# Patient Record
Sex: Female | Born: 1950 | Race: White | Hispanic: No | Marital: Married | Smoking: Never smoker
Health system: Southern US, Community
[De-identification: ages and names within clinical notes are randomized; demographics above are authoritative.]

## PROBLEM LIST (undated history)

## (undated) DIAGNOSIS — H409 Unspecified glaucoma: Secondary | ICD-10-CM

## (undated) DIAGNOSIS — I10 Essential (primary) hypertension: Secondary | ICD-10-CM

## (undated) DIAGNOSIS — E890 Postprocedural hypothyroidism: Secondary | ICD-10-CM

## (undated) DIAGNOSIS — M5416 Radiculopathy, lumbar region: Secondary | ICD-10-CM

## (undated) DIAGNOSIS — E079 Disorder of thyroid, unspecified: Secondary | ICD-10-CM

## (undated) DIAGNOSIS — F32A Depression, unspecified: Secondary | ICD-10-CM

## (undated) DIAGNOSIS — M1990 Unspecified osteoarthritis, unspecified site: Secondary | ICD-10-CM

## (undated) DIAGNOSIS — G4486 Cervicogenic headache: Secondary | ICD-10-CM

## (undated) DIAGNOSIS — F40240 Claustrophobia: Secondary | ICD-10-CM

## (undated) DIAGNOSIS — M4800 Spinal stenosis, site unspecified: Secondary | ICD-10-CM

## (undated) DIAGNOSIS — C73 Malignant neoplasm of thyroid gland: Secondary | ICD-10-CM

## (undated) DIAGNOSIS — F419 Anxiety disorder, unspecified: Secondary | ICD-10-CM

## (undated) HISTORY — DX: Radiculopathy, lumbar region: M54.16

## (undated) HISTORY — PX: FOOT SURGERY: SHX648

## (undated) HISTORY — DX: Unspecified osteoarthritis, unspecified site: M19.90

## (undated) HISTORY — DX: Claustrophobia: F40.240

## (undated) HISTORY — DX: Malignant neoplasm of thyroid gland: C73

## (undated) HISTORY — DX: Postprocedural hypothyroidism: E89.0

## (undated) HISTORY — DX: Unspecified glaucoma: H40.9

## (undated) HISTORY — DX: Essential (primary) hypertension: I10

## (undated) HISTORY — DX: Cervicogenic headache: G44.86

## (undated) HISTORY — DX: Depression, unspecified: F32.A

## (undated) HISTORY — DX: Anxiety disorder, unspecified: F41.9

## (undated) HISTORY — DX: Spinal stenosis, site unspecified: M48.00

---

## 2004-12-10 HISTORY — PX: THYROIDECTOMY: SHX17

## 2017-12-11 DIAGNOSIS — I10 Essential (primary) hypertension: Secondary | ICD-10-CM | POA: Insufficient documentation

## 2018-03-03 HISTORY — PX: LUMBAR LAMINECTOMY: SHX95

## 2019-10-08 LAB — HM COLONOSCOPY

## 2019-11-03 DIAGNOSIS — E890 Postprocedural hypothyroidism: Secondary | ICD-10-CM | POA: Insufficient documentation

## 2019-11-03 DIAGNOSIS — C73 Malignant neoplasm of thyroid gland: Secondary | ICD-10-CM | POA: Insufficient documentation

## 2019-12-14 LAB — HM DEXA SCAN

## 2020-06-21 DIAGNOSIS — M5416 Radiculopathy, lumbar region: Secondary | ICD-10-CM | POA: Insufficient documentation

## 2020-10-31 DIAGNOSIS — H4089 Other specified glaucoma: Secondary | ICD-10-CM | POA: Insufficient documentation

## 2020-10-31 DIAGNOSIS — Z9889 Other specified postprocedural states: Secondary | ICD-10-CM | POA: Insufficient documentation

## 2020-12-15 LAB — HM MAMMOGRAPHY

## 2021-04-03 HISTORY — PX: LAMINOTOMY: SHX998

## 2021-04-25 ENCOUNTER — Encounter (HOSPITAL_BASED_OUTPATIENT_CLINIC_OR_DEPARTMENT_OTHER): Payer: Self-pay | Admitting: Emergency Medicine

## 2021-04-25 ENCOUNTER — Emergency Department (HOSPITAL_BASED_OUTPATIENT_CLINIC_OR_DEPARTMENT_OTHER): Payer: Federal, State, Local not specified - PPO

## 2021-04-25 ENCOUNTER — Emergency Department (HOSPITAL_BASED_OUTPATIENT_CLINIC_OR_DEPARTMENT_OTHER)
Admission: EM | Admit: 2021-04-25 | Discharge: 2021-04-25 | Disposition: A | Discharge status: Home / Self Care | Payer: Federal, State, Local not specified - PPO | Attending: Emergency Medicine | Admitting: Emergency Medicine

## 2021-04-25 ENCOUNTER — Other Ambulatory Visit: Payer: Self-pay

## 2021-04-25 DIAGNOSIS — M545 Low back pain, unspecified: Secondary | ICD-10-CM | POA: Diagnosis present

## 2021-04-25 DIAGNOSIS — M5432 Sciatica, left side: Secondary | ICD-10-CM | POA: Insufficient documentation

## 2021-04-25 DIAGNOSIS — M5431 Sciatica, right side: Secondary | ICD-10-CM | POA: Insufficient documentation

## 2021-04-25 HISTORY — DX: Disorder of thyroid, unspecified: E07.9

## 2021-04-25 LAB — URINALYSIS, ROUTINE W REFLEX MICROSCOPIC
Bilirubin Urine: NEGATIVE
Glucose, UA: NEGATIVE mg/dL
Hgb urine dipstick: NEGATIVE
Ketones, ur: NEGATIVE mg/dL
Leukocytes,Ua: NEGATIVE
Nitrite: NEGATIVE
Protein, ur: NEGATIVE mg/dL
Specific Gravity, Urine: 1.015 (ref 1.005–1.030)
pH: 7 (ref 5.0–8.0)

## 2021-04-25 MED ORDER — KETOROLAC TROMETHAMINE 30 MG/ML IJ SOLN
30.0000 mg | Freq: Once | INTRAMUSCULAR | Status: AC
  Administered 2021-04-25: 30 mg via INTRAMUSCULAR
  Filled 2021-04-25: qty 1

## 2021-04-25 MED ORDER — DICLOFENAC SODIUM 1 % EX GEL: 2.0000 g | Freq: Four times a day (QID) | CUTANEOUS | 0 refills | Status: DC

## 2021-04-25 MED ORDER — METHOCARBAMOL 500 MG PO TABS: 500.0000 mg | ORAL_TABLET | Freq: Two times a day (BID) | ORAL | 0 refills | Status: DC

## 2021-04-25 MED ORDER — LIDOCAINE 5 % EX PTCH
2.0000 | MEDICATED_PATCH | CUTANEOUS | Status: DC
  Administered 2021-04-25: 2 via TRANSDERMAL
  Filled 2021-04-25: qty 2

## 2021-04-25 MED ORDER — LIDOCAINE 5 % EX PTCH: 1.0000 | MEDICATED_PATCH | CUTANEOUS | 0 refills | Status: DC

## 2021-04-25 NOTE — ED Triage Notes (Signed)
Pt arrives pov with driver, c/o bilateral leg pain, gluteal pain and weakness x 1 week. Pt endorses laminectomy 3 weeks pta in [place]. Pt denies shob, ambulatory to triage. Tramadol at 0900 today. Pt endoses lower abdominal pain, denies dysuria. Reports "my bladder hurts"

## 2021-04-25 NOTE — ED Provider Notes (Signed)
[facility] Provider Note   CSN: [id number] Arrival date & time: 04/25/21  1358     History Chief Complaint  Patient presents with  . Back Pain    Alyssa [name] is a 70 y.o. female.  70 year old female with complaint of pain from bilateral gluteal areas radiating down to her feet with sensation of tingling in both of her feet.  Lumbar lamenectomy L3-L4 at [facility] in [place] on 04/03/21. Dc home same day. Recovery was uneventful.  Patient recently relocated to this area, traveled via plane, no extensive period of inactivity or car rides otherwise.  Has been walking and reported tingling in the feet, tingling worse with sitting. Today, had same and went home to lay down and developed severe pain to both gluteal areas (equally painful), worse with sitting. Call to surgeon and was advised to come to the ER as she does not have care in this area. No falls or injuries, no injuries. Surgical site healing well. Bladder is "more notable" "hurts" not specifically dysuria, no lower abdominal pain.         Past Medical History:  Diagnosis Date  . Thyroid disease     There are no problems to display for this patient.   Past Surgical History:  Procedure Laterality Date  . BACK SURGERY    . FOOT SURGERY    . THYROIDECTOMY       OB History   No obstetric history on file.     History reviewed. No pertinent family history.  Social History   Tobacco Use  . Smoking status: Never Smoker  Substance Use Topics  . Alcohol use: Yes  . Drug use: Never    Home Medications Prior to Admission medications   Medication Sig Start Date End Date Taking? Authorizing Provider  diclofenac Sodium (VOLTAREN) 1 % GEL Apply 2 g topically 4 (four) times daily. 04/25/21  Yes Tacy Learn, PA-C  levothyroxine (SYNTHROID) 112 MCG tablet Take 112 mcg by mouth daily before breakfast.   Yes [provider]  lidocaine (LIDODERM) 5 % Place 1 patch onto the skin  daily. Remove & Discard patch within 12 hours or as directed by MD 04/25/21  Yes Tacy Learn, PA-C  methocarbamol (ROBAXIN) 500 MG tablet Take 1 tablet (500 mg total) by mouth 2 (two) times daily. 04/25/21  Yes Tacy Learn, PA-C    Allergies    Amoxicillin  Review of Systems   Review of Systems  Constitutional: Negative for fever.  Gastrointestinal: Negative for abdominal pain, nausea and vomiting.  Genitourinary: Negative for difficulty urinating, dysuria and pelvic pain.  Musculoskeletal: Positive for arthralgias and back pain. Negative for gait problem.  Skin: Negative for rash and wound.  Allergic/Immunologic: Negative for immunocompromised state.  Neurological: Negative for weakness and numbness.  All other systems reviewed and are negative.   Physical Exam Updated Vital Signs BP (!) 152/78 (BP Location: Right Arm)   Pulse 86   Temp 98.5 F (36.9 C) (Oral)   Resp 18   Ht 5\' 8"  (1.727 m)   Wt 72.6 kg   SpO2 100%   BMI 24.33 kg/m   Physical Exam Vitals and nursing note reviewed.  Constitutional:      General: She is not in acute distress.    Appearance: She is well-developed. She is not diaphoretic.  HENT:     Head: Normocephalic and atraumatic.  Cardiovascular:     Pulses: Normal pulses.  Pulmonary:  Effort: Pulmonary effort is normal.  Abdominal:     Palpations: Abdomen is soft.     Tenderness: There is no abdominal tenderness.  Musculoskeletal:        General: Tenderness present. No swelling.     Thoracic back: No tenderness or bony tenderness.     Lumbar back: Tenderness present. No bony tenderness. Positive right straight leg raise test and positive left straight leg raise test.       Back:     Right lower leg: No edema.     Left lower leg: No edema.     Comments: Tenderness of palpation left and right lumbar paraspinous extending to left and right SI areas.  Straight leg raise produces pain on each side with extension.  Skin:    General: Skin  is warm and dry.     Findings: No erythema or rash.  Neurological:     Mental Status: She is alert and oriented to person, place, and time.     Sensory: Sensation is intact.     Motor: No weakness.     Deep Tendon Reflexes: Babinski sign absent on the right side. Babinski sign absent on the left side.     Reflex Scores:      Patellar reflexes are 2+ on the right side and 2+ on the left side.      Achilles reflexes are 1+ on the right side and 1+ on the left side. Psychiatric:        Behavior: Behavior normal.     ED Results / Procedures / Treatments   Labs (all labs ordered are listed, but only abnormal results are displayed) Labs Reviewed  URINALYSIS, ROUTINE W REFLEX MICROSCOPIC    EKG None  Radiology DG Lumbar Spine Complete  Result Date: 04/25/2021 CLINICAL DATA:  Lower back pain. EXAM: LUMBAR SPINE - COMPLETE 4+ VIEW COMPARISON:  None. FINDINGS: A small, ill-defined chronic appearing deformity is seen along the anterior aspect of the inferior L1 vertebral body on the lateral view. Alignment is normal. Mild-to-moderate severity multilevel endplate sclerosis is seen, most prominent at the levels of L2-L3, L3-L4, L4-L5 and L5-S1. Intervertebral disc spaces are maintained. IMPRESSION: Mild to moderate severity multilevel degenerative changes, without an acute osseous abnormality. Electronically Signed   By: Virgina Norfolk M.D.   On: 04/25/2021 18:24    Procedures Procedures   Medications Ordered in ED Medications  lidocaine (LIDODERM) 5 % 2 patch (2 patches Transdermal Patch Applied 04/25/21 1805)  ketorolac (TORADOL) 30 MG/ML injection 30 mg (30 mg Intramuscular Given 04/25/21 1804)    ED Course  I have reviewed the triage vital signs and the nursing notes.  Pertinent labs & imaging results that were available during my care of the patient were reviewed by me and considered in my medical decision making (see chart for details).  Clinical Course as of 04/25/21 1839   Tue May 17, 278  [id number] 71 year old female with complaint of pain to left and right SI areas with report of pins and needle sensation down to her feet.  States that she has had problems with sciatica in the past and has been told that she may need some sort of procedure to assist with this.  She is status post L3-L4 lumbar laminectomy from April 03, 2021. Patient is recently relocated to this area and is awaiting follow-up with neurosurgery after she is called to request an appointment.  She does not have any new falls or injuries, no abdominal pain, no loss  of bowel or bladder control or groin numbness.  She is found of tenderness to her left right paraspinous lumbar area extending to SI joints bilaterally with pain on each side with straight leg raise.  Reflexes are symmetric as well as leg strength. Complaint of bladder pain without urinary symptoms otherwise, UA is unremarkable. X-ray of lumbar spine obtained due to recent surgical procedure although overlying scar is healing well. Patient is currently taking tramadol for her pain.  Plan is to treat with muscle relaxant, NSAID, Lidoderm patch and referral to sports medicine for further work-up while she awaits appointment with neurosurgery. [LM]    Clinical Course User Index [LM] Roque Lias   MDM Rules/Calculators/A&P                         Final Clinical Impression(s) / ED Diagnoses Final diagnoses:  Bilateral sciatica    Rx / DC Orders ED Discharge Orders         Ordered    lidocaine (LIDODERM) 5 %  Every 24 hours        04/25/21 1838    methocarbamol (ROBAXIN) 500 MG tablet  2 times daily        04/25/21 1838    diclofenac Sodium (VOLTAREN) 1 % GEL  4 times daily        04/25/21 1838           Tacy Learn, PA-C 04/25/21 1839    Blanchie Dessert, MD 05/03/21 [phone]

## 2021-04-25 NOTE — ED Notes (Addendum)
L3-L4 sx 3 weeks ago, pain to lower back, radiating down both legs to feet causing tingling.  Worsening in the last week. States pain worse with sitting. Took tylenol this am with no result

## 2021-04-25 NOTE — Discharge Instructions (Signed)
Warm compresses followed up gentle stretching. Lidoderm patch as needed as prescribed. Robaxin as needed as prescribed.  Topical Voltaren as needed as prescribed.  Follow up with sports medicine, call to schedule an appointment.

## 2021-04-26 DIAGNOSIS — G4486 Cervicogenic headache: Secondary | ICD-10-CM | POA: Insufficient documentation

## 2021-04-26 DIAGNOSIS — F40240 Claustrophobia: Secondary | ICD-10-CM | POA: Insufficient documentation

## 2021-04-26 DIAGNOSIS — F32A Anxiety disorder, unspecified: Secondary | ICD-10-CM | POA: Insufficient documentation

## 2021-04-26 DIAGNOSIS — F419 Anxiety disorder, unspecified: Secondary | ICD-10-CM | POA: Insufficient documentation

## 2021-05-11 ENCOUNTER — Ambulatory Visit: Payer: Federal, State, Local not specified - PPO | Admitting: Family Medicine

## 2021-05-15 ENCOUNTER — Other Ambulatory Visit: Payer: Self-pay

## 2021-05-15 ENCOUNTER — Ambulatory Visit: Payer: Federal, State, Local not specified - PPO | Admitting: Family Medicine

## 2021-05-15 ENCOUNTER — Ambulatory Visit: Payer: Self-pay

## 2021-05-15 ENCOUNTER — Encounter: Payer: Self-pay | Admitting: Family Medicine

## 2021-05-15 VITALS — BP 112/84 | Ht 68.0 in | Wt 160.0 lb

## 2021-05-15 DIAGNOSIS — M533 Sacrococcygeal disorders, not elsewhere classified: Secondary | ICD-10-CM | POA: Diagnosis not present

## 2021-05-15 DIAGNOSIS — Z9889 Other specified postprocedural states: Secondary | ICD-10-CM | POA: Diagnosis not present

## 2021-05-15 DIAGNOSIS — M48062 Spinal stenosis, lumbar region with neurogenic claudication: Secondary | ICD-10-CM

## 2021-05-15 DIAGNOSIS — M79675 Pain in left toe(s): Secondary | ICD-10-CM

## 2021-05-15 NOTE — Patient Instructions (Signed)
Nice to meet you Please try voltaren on the feet  Please try increasing the gabapentin  Please try the strengthening  When you find your orthotics, then I can add additional padding  Please send me a message in [place] with any questions or updates.  Please see me back in 4 weeks.   --Dr. Raeford Razor

## 2021-05-15 NOTE — Assessment & Plan Note (Signed)
Has had fusion of the first ray in February.  Still having some swelling on the area.  Seems a component of metatarsalgia in the left foot since the surgery. -Counseled on home exercise therapy and supportive care. -She will bring her custom orthotics and can place metatarsal padding and scaphoid pads.  May need to add a first ray post as well.

## 2021-05-15 NOTE — Assessment & Plan Note (Signed)
Has tenderness over the SI joints and also weakness with hip abduction.  Unclear if the SI joints or the source of her pain or the facet changes. -Counseled on home exercise therapy and supportive care. -Continue physical therapy. -Could consider injection.

## 2021-05-15 NOTE — Assessment & Plan Note (Signed)
Most recent back surgery was in April.  Does seem to have symptoms that could be contributed to the stenosis. -Counseled on home exercise therapy and supportive care. -Increase gabapentin to 3 times a day. -Could consider epidural.

## 2021-05-15 NOTE — Progress Notes (Signed)
Alyssa [name] - 70 y.o. female MRN [id number]  Date of birth: 1951/07/16  SUBJECTIVE:  Including CC & ROS.  No chief complaint on file.   Alyssa [name] is a 70 y.o. female that is presenting with acute on chronic low back pain with radicular pain.  She i is also experiencing pain in the left foot.  She has a history of back surgery on a few different occasions.  Today she is presenting with gluteal pain bilaterally and radicular pain down the legs.  Her most recent back surgery was in April.  She has a history of fusion of the left first ray in February.  Has had swelling and pain in the area since that time.  Has tried orthotics in the past.  Is currently on gabapentin and tramadol.  Review of the lumbar spine MRI from 2021 shows post left hemilaminectomy at L4-5 without recurrent spinal stenosis and no change in the mild to moderate spinal stenosis L3-4.  Having multiple level facet arthropathy   Review of Systems See HPI   HISTORY: Past Medical, Surgical, Social, and Family History Reviewed & Updated per EMR.   Pertinent Historical Findings include:  Past Medical History:  Diagnosis Date  . Anxiety and depression   . Arthritis   . Cervicogenic headache   . Claustrophobia   . Glaucoma   . Hypertension   . Lumbar radiculopathy   . Postsurgical hypothyroidism   . Spinal stenosis   . Thyroid cancer [facility])    s/p thyroidectomy    Past Surgical History:  Procedure Laterality Date  . FOOT SURGERY    . LAMINOTOMY  04/03/2021   LEFT L3/4 LAMINOTOMY BILATERAL L3/4 MEDIAL FACETECTOMY  . LUMBAR LAMINECTOMY  03/03/2018  . THYROIDECTOMY  12/2004    Family History  Problem Relation Age of Onset  . Hypertension Sister   . Diabetes Paternal Grandmother     Social History   Socioeconomic History  . Marital status: Married    Spouse name: Not on file  . Number of children: Not on file  . Years of education: Not on file  . Highest education level: Not on file  Occupational  History  . Not on file  Tobacco Use  . Smoking status: Never Smoker  . Smokeless tobacco: Never Used  Substance and Sexual Activity  . Alcohol use: Yes  . Drug use: Never  . Sexual activity: Not on file  Other Topics Concern  . Not on file  Social History Narrative  . Not on file   Social Determinants of Health   Financial Resource Strain: Not on file  Food Insecurity: Not on file  Transportation Needs: Not on file  Physical Activity: Not on file  Stress: Not on file  Social Connections: Not on file  Intimate Partner Violence: Not on file     PHYSICAL EXAM:  VS: BP 112/84 (BP Location: Left Arm, Patient Position: Sitting, Cuff Size: Large)   Ht 5\' 8"  (1.727 m)   Wt 160 lb (72.6 kg)   BMI 24.33 kg/m  Physical Exam Gen: NAD, alert, cooperative with exam, well-appearing MSK:  Left foot: Swelling and stiffness related to the first ray. Stiffness through the MTP joints. Back: Good motion with SI joint compression. Normal strength resistance. Tenderness to palpation over the SI joints. Neurovascular intact  Limited ultrasound: Left foot:  Metal artifact was appreciated over the first ray. No increased hyperemia in this area. Anechoic change at the fibular aspect of the first ray to  demonstrate effusion/swelling.  Summary: Ongoing healing from postsurgical changes.  Ultrasound and interpretation by Clearance Coots, MD    ASSESSMENT & PLAN:   SI (sacroiliac) joint dysfunction Has tenderness over the SI joints and also weakness with hip abduction.  Unclear if the SI joints or the source of her pain or the facet changes. -Counseled on home exercise therapy and supportive care. -Continue physical therapy. -Could consider injection.  Spinal stenosis, lumbar region, with neurogenic claudication Most recent back surgery was in April.  Does seem to have symptoms that could be contributed to the stenosis. -Counseled on home exercise therapy and supportive  care. -Increase gabapentin to 3 times a day. -Could consider epidural.  Status post left foot surgery Has had fusion of the first ray in February.  Still having some swelling on the area.  Seems a component of metatarsalgia in the left foot since the surgery. -Counseled on home exercise therapy and supportive care. -She will bring her custom orthotics and can place metatarsal padding and scaphoid pads.  May need to add a first ray post as well.

## 2021-06-05 ENCOUNTER — Encounter: Payer: Self-pay | Admitting: Internal Medicine

## 2021-06-05 ENCOUNTER — Other Ambulatory Visit: Payer: Self-pay

## 2021-06-05 ENCOUNTER — Ambulatory Visit: Payer: Federal, State, Local not specified - PPO | Admitting: Internal Medicine

## 2021-06-05 ENCOUNTER — Telehealth: Payer: Self-pay | Admitting: Internal Medicine

## 2021-06-05 VITALS — BP 122/60 | HR 62 | Temp 98.2°F | Resp 18 | Ht 68.0 in | Wt 166.2 lb

## 2021-06-05 DIAGNOSIS — E890 Postprocedural hypothyroidism: Secondary | ICD-10-CM

## 2021-06-05 DIAGNOSIS — I10 Essential (primary) hypertension: Secondary | ICD-10-CM

## 2021-06-05 DIAGNOSIS — H409 Unspecified glaucoma: Secondary | ICD-10-CM | POA: Diagnosis not present

## 2021-06-05 DIAGNOSIS — Z01419 Encounter for gynecological examination (general) (routine) without abnormal findings: Secondary | ICD-10-CM

## 2021-06-05 DIAGNOSIS — Z85850 Personal history of malignant neoplasm of thyroid: Secondary | ICD-10-CM | POA: Diagnosis not present

## 2021-06-05 LAB — COMPREHENSIVE METABOLIC PANEL
ALT: 18 U/L (ref 0–35)
AST: 20 U/L (ref 0–37)
Albumin: 4.2 g/dL (ref 3.5–5.2)
Alkaline Phosphatase: 80 U/L (ref 39–117)
BUN: 14 mg/dL (ref 6–23)
CO2: 27 mEq/L (ref 19–32)
Calcium: 9.4 mg/dL (ref 8.4–10.5)
Chloride: 102 mEq/L (ref 96–112)
Creatinine, Ser: 0.76 mg/dL (ref 0.40–1.20)
GFR: 80.75 mL/min (ref >60.00)
Glucose, Bld: 66 mg/dL — ABNORMAL LOW (ref 70–99)
Potassium: 3.9 mEq/L (ref 3.5–5.1)
Sodium: 138 mEq/L (ref 135–145)
Total Bilirubin: 0.6 mg/dL (ref 0.2–1.2)
Total Protein: 6.5 g/dL (ref 6.0–8.3)

## 2021-06-05 LAB — TSH: TSH: 0.08 u[IU]/mL — ABNORMAL LOW (ref 0.35–4.50)

## 2021-06-05 NOTE — Progress Notes (Signed)
Subjective:    Patient ID: Alyssa [name], female    DOB: Jan 28, 1951, 70 y.o.   MRN: [id number]  DOS:  06/05/2021 Type of visit - description: New patient   Recently moved to this area, referred to the office by her sister.  Currently having back pain with radiation to the gluteal area bilaterally, some paresthesias, worse on the left leg.  She denies fever chills No chest pain no difficulty breathing Emotionally doing well.    Review of Systems See above   Past Medical History:  Diagnosis Date   Anxiety and depression    Arthritis    Cervicogenic headache    Claustrophobia    Glaucoma    Hypertension    Lumbar radiculopathy    Postsurgical hypothyroidism    Spinal stenosis    Thyroid cancer [facility])    s/p thyroidectomy    Past Surgical History:  Procedure Laterality Date   FOOT SURGERY     LAMINOTOMY  04/03/2021   LEFT L3/4 LAMINOTOMY BILATERAL L3/4 MEDIAL FACETECTOMY   LUMBAR LAMINECTOMY  03/03/2018   THYROIDECTOMY  12/2004   Social History   Socioeconomic History   Marital status: Married    Spouse name: Not on file   Number of children: 0   Years of education: Not on file   Highest education level: Not on file  Occupational History   Occupation: retired, Pharmacologist  Tobacco Use   Smoking status: Never   Smokeless tobacco: Never  Substance and Sexual Activity   Alcohol use: Yes   Drug use: Never   Sexual activity: Not on file  Other Topics Concern   Not on file  Social History Narrative   Moved from [place] June 2022   Lives w/ husband   Moved to stay close to her sister (Mrs Terance Hart)   Social Determinants of Health   Financial Resource Strain: Not on file  Food Insecurity: Not on file  Transportation Needs: Not on file  Physical Activity: Not on file  Stress: Not on file  Social Connections: Not on file  Intimate Partner Violence: Not on file   Family History  Problem Relation Age of Onset   Hypertension Sister    Diabetes Paternal  Grandmother    Colon cancer Neg Hx    Breast cancer Neg Hx      Allergies as of 06/05/2021       Reactions   Amoxicillin Hives        Medication List        Accurate as of June 05, 2021 11:59 PM. If you have any questions, ask your nurse or doctor.          STOP taking these medications    lidocaine 5 % Commonly known as: Lidoderm Stopped by: Kathlene November, MD   methocarbamol 500 MG tablet Commonly known as: ROBAXIN Stopped by: Kathlene November, MD       TAKE these medications    acyclovir 400 MG tablet Commonly known as: ZOVIRAX acyclovir 400 mg tablet  PRN   diclofenac Sodium 1 % Gel Commonly known as: VOLTAREN Apply 2 g topically 4 (four) times daily.   escitalopram 10 MG tablet Commonly known as: LEXAPRO   gabapentin 300 MG capsule Commonly known as: NEURONTIN Take 300 mg by mouth 2 (two) times daily. What changed: Another medication with the same name was removed. Continue taking this medication, and follow the directions you see here. Changed by: Kathlene November, MD   levothyroxine 112 MCG tablet  Commonly known as: SYNTHROID Take 112 mcg by mouth daily before breakfast.   meloxicam 7.5 MG tablet Commonly known as: MOBIC Take 7.5 mg by mouth daily.   timolol 0.5 % ophthalmic solution Commonly known as: TIMOPTIC timolol maleate 0.5 % eye drops  once daily   traMADol 50 MG tablet Commonly known as: ULTRAM Take by mouth.   TRAVOPROST OP Apply to eye.   zolpidem 12.5 MG CR tablet Commonly known as: AMBIEN CR Take by mouth as needed.           Objective:   Physical Exam BP 122/60 (BP Location: Left Arm, Patient Position: Sitting, Cuff Size: Normal)   Pulse 62   Temp 98.2 F (36.8 C) (Oral)   Resp 18   Ht 5\' 8"  (1.727 m)   Wt 166 lb 3.2 oz (75.4 kg)   SpO2 98%   BMI 25.27 kg/m  General:   Well developed, NAD, BMI noted.  HEENT:  Normocephalic . Face symmetric, atraumatic Neck: Absent thyroid, no lymphadenopathies Lungs:  CTA B Normal  respiratory effort, no intercostal retractions, no accessory muscle use. Heart: RRR,  no murmur.  Abdomen:  Not distended, soft, non-tender. No rebound or rigidity.   Skin: Not pale. Not jaundice Lower extremities: no pretibial edema bilaterally  Neurologic:  alert & oriented X3.  Speech normal, gait appropriate for age and unassisted Psych--  Cognition and judgment appear intact.  Cooperative with normal attention span and concentration.  Behavior appropriate. No anxious or depressed appearing.     Assessment    Assessment H/o  HTN anxiety related, no meds  Anxiety Thyroid cancer Dx 2006, total thyroidectomy 2006. Glaucoma MSK: Back surgery 03/2021, DJD, foot surgery, cervical stenosis  Plan: HTN: History of, currently on no medicines, check a CMP and CBC Hypothyroidism, status post total thyroidectomy 2006, request Endo referral.  We will arrange, check TSH Anxiety: On Lexapro, seems well controlled. MSK:  Had 2 lumbar laminectomies, last was seen in April 2022 in [place], locally here already established and  sees Dr. Vertell Limber. Also reports bilateral foot pain, has seen Ortho. Glaucoma: Needs a referral to ophthalmology. Preventive care: Had 2 or 3 colonoscopies, the last was 2021, + polyps, next 5 years I note that she had recent her mammogram Had COVID-vaccine x3, okay to proceed with another vaccine at her convenience. Needs a referral to gynecology. DEXA 12/14/2019: T score -1.2, osteopenia RTC CPX 4 months   This visit occurred during the SARS-CoV-2 public health emergency.  Safety protocols were in place, including screening questions prior to the visit, additional usage of staff PPE, and extensive cleaning of exam room while observing appropriate contact time as indicated for disinfecting solutions.

## 2021-06-05 NOTE — Telephone Encounter (Signed)
Please advise 

## 2021-06-05 NOTE — Telephone Encounter (Signed)
Advise patient to call 10 days prior to physical to schedule labs.

## 2021-06-05 NOTE — Patient Instructions (Addendum)
   GO TO THE LAB : Get the blood work     GO TO THE FRONT DESK, [place] Come back for a physical exam in 3-4 months

## 2021-06-05 NOTE — Telephone Encounter (Signed)
[facility] booked for morning appts until dec  Patient needed one in oct  Can we put labs in for fasting?

## 2021-06-05 NOTE — Telephone Encounter (Signed)
Please inform Pt- thank you.

## 2021-06-06 DIAGNOSIS — Z09 Encounter for follow-up examination after completed treatment for conditions other than malignant neoplasm: Secondary | ICD-10-CM | POA: Insufficient documentation

## 2021-06-06 NOTE — Assessment & Plan Note (Signed)
HTN: History of, currently on no medicines, check a CMP and CBC Hypothyroidism, status post total thyroidectomy 2006, request Endo referral.  We will arrange, check TSH Anxiety: On Lexapro, seems well controlled. MSK:  Had 2 lumbar laminectomies, last was seen in April 2022 in [place], locally here already established and  sees Dr. Vertell Limber. Also reports bilateral foot pain, has seen Ortho. Glaucoma: Needs a referral to ophthalmology. Preventive care: Had 2 or 3 colonoscopies, the last was 2021, + polyps, next 5 years I note that she had recent her mammogram Had COVID-vaccine x3, okay to proceed with another vaccine at her convenience. Needs a referral to gynecology. DEXA 12/14/2019: T score -1.2, osteopenia RTC CPX 4 months

## 2021-06-07 NOTE — Addendum Note (Signed)
Addended byDamita Dunnings D on: 06/07/2021 03:49 PM   Modules accepted: Orders

## 2021-06-08 ENCOUNTER — Other Ambulatory Visit: Payer: Self-pay

## 2021-06-08 ENCOUNTER — Other Ambulatory Visit (INDEPENDENT_AMBULATORY_CARE_PROVIDER_SITE_OTHER): Payer: Federal, State, Local not specified - PPO

## 2021-06-08 DIAGNOSIS — I10 Essential (primary) hypertension: Secondary | ICD-10-CM | POA: Diagnosis not present

## 2021-06-08 LAB — CBC WITH DIFFERENTIAL/PLATELET
Basophils Absolute: 0.1 10*3/uL (ref 0.0–0.1)
Basophils Relative: 2.9 % (ref 0.0–3.0)
Eosinophils Absolute: 0.1 10*3/uL (ref 0.0–0.7)
Eosinophils Relative: 1.8 % (ref 0.0–5.0)
HCT: 38.4 % (ref 36.0–46.0)
Hemoglobin: 13.2 g/dL (ref 12.0–15.0)
Lymphocytes Relative: 34.7 % (ref 12.0–46.0)
Lymphs Abs: 1.7 10*3/uL (ref 0.7–4.0)
MCHC: 34.3 g/dL (ref 30.0–36.0)
MCV: 86.8 fl (ref 78.0–100.0)
Monocytes Absolute: 0.4 10*3/uL (ref 0.1–1.0)
Monocytes Relative: 8.4 % (ref 3.0–12.0)
Neutro Abs: 2.6 10*3/uL (ref 1.4–7.7)
Neutrophils Relative %: 52.2 % (ref 43.0–77.0)
Platelets: 253 10*3/uL (ref 150.0–400.0)
RBC: 4.42 Mil/uL (ref 3.87–5.11)
RDW: 12.9 % (ref 11.5–15.5)
WBC: 5 10*3/uL (ref 4.0–10.5)

## 2021-06-14 ENCOUNTER — Ambulatory Visit: Payer: Federal, State, Local not specified - PPO | Admitting: Family Medicine

## 2021-06-16 ENCOUNTER — Observation Stay (HOSPITAL_BASED_OUTPATIENT_CLINIC_OR_DEPARTMENT_OTHER)
Admission: EM | Admit: 2021-06-16 | Discharge: 2021-06-17 | Disposition: A | Discharge status: Home / Self Care | Payer: Medicare Other | Attending: Family Medicine | Admitting: Family Medicine

## 2021-06-16 ENCOUNTER — Other Ambulatory Visit: Payer: Self-pay

## 2021-06-16 ENCOUNTER — Encounter (HOSPITAL_BASED_OUTPATIENT_CLINIC_OR_DEPARTMENT_OTHER): Payer: Self-pay | Admitting: *Deleted

## 2021-06-16 ENCOUNTER — Ambulatory Visit (INDEPENDENT_AMBULATORY_CARE_PROVIDER_SITE_OTHER): Payer: Medicare Other | Admitting: Medical

## 2021-06-16 ENCOUNTER — Emergency Department (HOSPITAL_BASED_OUTPATIENT_CLINIC_OR_DEPARTMENT_OTHER): Payer: Medicare Other

## 2021-06-16 ENCOUNTER — Telehealth: Payer: Self-pay | Admitting: *Deleted

## 2021-06-16 ENCOUNTER — Encounter: Payer: Self-pay | Admitting: Medical

## 2021-06-16 VITALS — BP 189/90 | HR 64 | Temp 98.3°F | Resp 20 | Ht 68.0 in | Wt 169.6 lb

## 2021-06-16 DIAGNOSIS — K2900 Acute gastritis without bleeding: Secondary | ICD-10-CM | POA: Diagnosis not present

## 2021-06-16 DIAGNOSIS — K5900 Constipation, unspecified: Secondary | ICD-10-CM | POA: Diagnosis not present

## 2021-06-16 DIAGNOSIS — R112 Nausea with vomiting, unspecified: Secondary | ICD-10-CM | POA: Diagnosis present

## 2021-06-16 DIAGNOSIS — R61 Generalized hyperhidrosis: Secondary | ICD-10-CM | POA: Diagnosis not present

## 2021-06-16 DIAGNOSIS — Z20822 Contact with and (suspected) exposure to covid-19: Secondary | ICD-10-CM | POA: Insufficient documentation

## 2021-06-16 DIAGNOSIS — E039 Hypothyroidism, unspecified: Secondary | ICD-10-CM | POA: Insufficient documentation

## 2021-06-16 DIAGNOSIS — K5909 Other constipation: Secondary | ICD-10-CM

## 2021-06-16 DIAGNOSIS — Z79899 Other long term (current) drug therapy: Secondary | ICD-10-CM | POA: Insufficient documentation

## 2021-06-16 DIAGNOSIS — E871 Hypo-osmolality and hyponatremia: Principal | ICD-10-CM | POA: Diagnosis present

## 2021-06-16 DIAGNOSIS — R8271 Bacteriuria: Secondary | ICD-10-CM | POA: Insufficient documentation

## 2021-06-16 DIAGNOSIS — Z85850 Personal history of malignant neoplasm of thyroid: Secondary | ICD-10-CM | POA: Insufficient documentation

## 2021-06-16 DIAGNOSIS — R55 Syncope and collapse: Secondary | ICD-10-CM | POA: Diagnosis not present

## 2021-06-16 DIAGNOSIS — I10 Essential (primary) hypertension: Secondary | ICD-10-CM | POA: Diagnosis not present

## 2021-06-16 DIAGNOSIS — K2970 Gastritis, unspecified, without bleeding: Secondary | ICD-10-CM

## 2021-06-16 LAB — URINALYSIS, ROUTINE W REFLEX MICROSCOPIC
Bilirubin Urine: NEGATIVE
Glucose, UA: NEGATIVE mg/dL
Hgb urine dipstick: NEGATIVE
Ketones, ur: 15 mg/dL — AB
Nitrite: NEGATIVE
Protein, ur: NEGATIVE mg/dL
Specific Gravity, Urine: 1.01 (ref 1.005–1.030)
pH: 7 (ref 5.0–8.0)

## 2021-06-16 LAB — CBC WITH DIFFERENTIAL/PLATELET
Abs Immature Granulocytes: 0.02 10*3/uL (ref 0.00–0.07)
Basophils Absolute: 0.2 10*3/uL — ABNORMAL HIGH (ref 0.0–0.1)
Basophils Relative: 2 %
Eosinophils Absolute: 0 10*3/uL (ref 0.0–0.5)
Eosinophils Relative: 1 %
HCT: 41.4 % (ref 36.0–46.0)
Hemoglobin: 14.6 g/dL (ref 12.0–15.0)
Immature Granulocytes: 0 %
Lymphocytes Relative: 23 %
Lymphs Abs: 1.5 10*3/uL (ref 0.7–4.0)
MCH: 29.6 pg (ref 26.0–34.0)
MCHC: 35.3 g/dL (ref 30.0–36.0)
MCV: 83.8 fL (ref 80.0–100.0)
Monocytes Absolute: 0.3 10*3/uL (ref 0.1–1.0)
Monocytes Relative: 5 %
Neutro Abs: 4.6 10*3/uL (ref 1.7–7.7)
Neutrophils Relative %: 69 %
Platelets: 326 10*3/uL (ref 150–400)
RBC: 4.94 MIL/uL (ref 3.87–5.11)
RDW: 12.1 % (ref 11.5–15.5)
WBC: 6.7 10*3/uL (ref 4.0–10.5)
nRBC: 0 % (ref 0.0–0.2)

## 2021-06-16 LAB — URINALYSIS, MICROSCOPIC (REFLEX)

## 2021-06-16 LAB — COMPREHENSIVE METABOLIC PANEL
ALT: 26 U/L (ref 0–44)
AST: 30 U/L (ref 15–41)
Albumin: 4.4 g/dL (ref 3.5–5.0)
Alkaline Phosphatase: 86 U/L (ref 38–126)
Anion gap: 8 (ref 5–15)
BUN: 10 mg/dL (ref 8–23)
CO2: 23 mmol/L (ref 22–32)
Calcium: 9.2 mg/dL (ref 8.9–10.3)
Chloride: 91 mmol/L — ABNORMAL LOW (ref 98–111)
Creatinine, Ser: 0.72 mg/dL (ref 0.44–1.00)
GFR, Estimated: >60 mL/min (ref >60)
Glucose, Bld: 125 mg/dL — ABNORMAL HIGH (ref 70–99)
Potassium: 3.9 mmol/L (ref 3.5–5.1)
Sodium: 122 mmol/L — ABNORMAL LOW (ref 135–145)
Total Bilirubin: 0.8 mg/dL (ref 0.3–1.2)
Total Protein: 7.4 g/dL (ref 6.5–8.1)

## 2021-06-16 LAB — LIPASE, BLOOD: Lipase: 31 U/L (ref 11–51)

## 2021-06-16 LAB — RESP PANEL BY RT-PCR (FLU A&B, COVID) ARPGX2
Influenza A by PCR: NEGATIVE
Influenza B by PCR: NEGATIVE
SARS Coronavirus 2 by RT PCR: NEGATIVE

## 2021-06-16 MED ORDER — SODIUM CHLORIDE 0.9 % IV BOLUS
1000.0000 mL | Freq: Once | INTRAVENOUS | Status: AC
  Administered 2021-06-16: 1000 mL via INTRAVENOUS

## 2021-06-16 MED ORDER — PANTOPRAZOLE SODIUM 40 MG IV SOLR
40.0000 mg | Freq: Once | INTRAVENOUS | Status: AC
  Administered 2021-06-16: 40 mg via INTRAVENOUS
  Filled 2021-06-16: qty 40

## 2021-06-16 MED ORDER — ONDANSETRON HCL 4 MG/2ML IJ SOLN
4.0000 mg | Freq: Once | INTRAMUSCULAR | Status: AC
  Administered 2021-06-16: 4 mg via INTRAVENOUS
  Filled 2021-06-16: qty 2

## 2021-06-16 MED ORDER — IOHEXOL 300 MG/ML  SOLN
100.0000 mL | Freq: Once | INTRAMUSCULAR | Status: AC | PRN
  Administered 2021-06-16: 100 mL via INTRAVENOUS

## 2021-06-16 NOTE — Telephone Encounter (Signed)
Patient has appointment today.   Call Type Triage / Clinical Relationship To Patient Self Return Phone Number [phone] (Primary) Chief Complaint FAINTING or [place] Reason for Call Symptomatic / Request for Health Information Initial Comment PT has been having nausea, sweating, fainting feeling, nerve pain and feet is hurting and sensitive. Translation No Nurse Assessment Nurse: Kathi Ludwig, RN, Leana Roe Date/Time (Eastern Time): 06/16/2021 12:53:09 PM Confirm and document reason for call. If symptomatic, describe symptoms. ---Caller states diaphoretic, nausea, feeling faint. no bp meds, no diabetes

## 2021-06-16 NOTE — ED Notes (Addendum)
Pt reports she had back surgery in April and foot surgery in Feb. Reports she has been having pains since. States today she was vomiting and felt like she may faint. Pt reports she has been constipated for the last week until arriving to ED where she had a large BM.

## 2021-06-16 NOTE — Progress Notes (Signed)
Patient arrived to the unit via stretcher from [place] at approx 2241. Patient alert and oriented x4 no c/o pain at this time. On call notified of patient arrival. Telemetry placed upon arrival Will continue to monitor.

## 2021-06-16 NOTE — ED Triage Notes (Signed)
Vomiting since 11am. She was seen by her MD and given Phenergan. Vomiting continues.

## 2021-06-16 NOTE — Progress Notes (Signed)
Subjective:    Patient ID: Alyssa [name], female    DOB: [birth year], 70 y.o.   MRN: [id number]  HPI  Pt has been feeling very fatigued for one week. Hx of some dehydration in past per pt and husband.  When I  first came in room she started vomiting about 15-20 times Earlier today she was sweating randomly and felt like she was going to pass out.  Pt calf muscle feel tight over few weeks.some baseline calf tight feeling.  Pt states about one month ago she had headaches, teeth pain, feeling of sand in her feet along with nausea.(Describes even a month ago not well)  Pt has hx of htn sx in chart but June reading very good Her blood pressure was very high today. Stayed high on recheck. Mild ha but came on after vomiting. No gross motor or sensory deficts reports.  One week since signiticant stool only small hard stools. Hx of constipation. Today feels bloated. No hx of obstruction or bowel surgeries.  Nausea and vomiting today. Pt did eat breaking smoothie, piece of toast, fruit and coffee. No lunch.         Review of Systems  Constitutional:  Positive for diaphoresis and fatigue.  Respiratory:  Negative for cough, chest tightness, shortness of breath and wheezing.   Cardiovascular:  Negative for chest pain and palpitations.  Gastrointestinal:  Negative for abdominal pain.  Genitourinary:  Negative for difficulty urinating, dyspareunia, dysuria, frequency, pelvic pain and urgency.  Musculoskeletal:  Negative for back pain and neck pain.  Skin:  Negative for rash.  Neurological:  Negative for dizziness and headaches.       Near syncope early in day.  Hematological:  Negative for adenopathy. Does not bruise/bleed easily.  Psychiatric/Behavioral:  Negative for behavioral problems, decreased concentration and sleep disturbance. The patient is not nervous/anxious.     Past Medical History:  Diagnosis Date   Anxiety and depression    Arthritis    Cervicogenic headache     Claustrophobia    Glaucoma    Hypertension    Lumbar radiculopathy    Postsurgical hypothyroidism    Spinal stenosis    Thyroid cancer (HCC)    s/p thyroidectomy     Social History   Socioeconomic History   Marital status: Married    Spouse name: Not on file   Number of children: 0   Years of education: Not on file   Highest education level: Not on file  Occupational History   Occupation: retired, Pharmacologist  Tobacco Use   Smoking status: Never   Smokeless tobacco: Never  Substance and Sexual Activity   Alcohol use: Yes   Drug use: Never   Sexual activity: Not on file  Other Topics Concern   Not on file  Social History Narrative   Moved from [place] June 2022   Lives w/ husband   Moved to stay close to her sister (Mrs Terance Hart)   Social Determinants of Health   Financial Resource Strain: Not on file  Food Insecurity: Not on file  Transportation Needs: Not on file  Physical Activity: Not on file  Stress: Not on file  Social Connections: Not on file  Intimate Partner Violence: Not on file    Past Surgical History:  Procedure Laterality Date   FOOT SURGERY     LAMINOTOMY  04/03/2021   LEFT L3/4 LAMINOTOMY BILATERAL L3/4 MEDIAL FACETECTOMY   LUMBAR LAMINECTOMY  03/03/2018   THYROIDECTOMY  12/2004  Family History  Problem Relation Age of Onset   Hypertension Sister    Diabetes Paternal Grandmother    Colon cancer Neg Hx    Breast cancer Neg Hx     Allergies  Allergen Reactions   Amoxicillin Hives    Current Outpatient Medications on File Prior to Visit  Medication Sig Dispense Refill   acyclovir (ZOVIRAX) 400 MG tablet acyclovir 400 mg tablet  PRN     diclofenac Sodium (VOLTAREN) 1 % GEL Apply 2 g topically 4 (four) times daily. 100 g 0   escitalopram (LEXAPRO) 10 MG tablet      gabapentin (NEURONTIN) 300 MG capsule Take 300 mg by mouth 2 (two) times daily.     levothyroxine (SYNTHROID) 112 MCG tablet Take 1 tablet by mouth 6 days weekly      meloxicam (MOBIC) 7.5 MG tablet Take 7.5 mg by mouth daily.     timolol (TIMOPTIC) 0.5 % ophthalmic solution timolol maleate 0.5 % eye drops  once daily     traMADol (ULTRAM) 50 MG tablet Take by mouth.     TRAVOPROST OP Apply to eye.     zolpidem (AMBIEN CR) 12.5 MG CR tablet Take by mouth as needed.     No current facility-administered medications on file prior to visit.    BP (!) 189/90   Pulse 64   Temp 98.3 F (36.8 C)   Resp 20   Ht 5\' 8"  (1.727 m)   Wt 169 lb 9.6 oz (76.9 kg)   SpO2 100%   BMI 25.79 kg/m       Objective:   Physical Exam  General Mental Status- Alert. General Appearance- Not in acute distress.   Skin General: Color- Normal Color. Moisture- Normal Moisture.  Neck Carotid Arteries- Normal color. Moisture- Normal Moisture. No carotid bruits. No JVD.  Chest and Lung Exam Auscultation: Breath Sounds:-Normal.  Cardiovascular Auscultation:Rythm- Regular. Murmurs & Other Heart Sounds:Auscultation of the heart reveals- No Murmurs.  Abdomen Inspection:-Inspeection Normal. Palpation/Percussion:Note:No mass. Palpation and Percussion of the abdomen reveal- mild rt upper quadrant Tender, Non Distended + BS, no rebound or guarding.    Neurologic Cranial Nerve exam:- CN III-XII intact(No nystagmus), symmetric smile. Drift Test:- No drift. Finger to Nose:- Normal/Intact Strength:- 5/5 equal and symmetric strength both upper and lower extremities.       Assessment & Plan:  Pt with severe fatigue, near syncope earlier today, sweating,  severe high bp, constipation, heavy vomiting, mild abdomen pain/bloating and tight calf muscles(maybe cramping).   Considering need to rule out bowel obstruction, pancreatitis, evaluate electrolyte abnormality and bring bp down to reasonable level.   Overall appears not stable as we approach weekend. Think best to be evaluated in the ED for work up. I think would benefit cmp, cbc, abdomen imaging, lipae, possible iv  hydration and bp medication if bp not coming down to reasonable level.  Asked staff to call ED and update on presentation.  Follow up with pcp/our office after ED evaluation.  Time spent with patient today was 41  minutes which consisted of chart revdiew, discussing diagnosis, work up treatment and documentation.

## 2021-06-16 NOTE — ED Notes (Signed)
ED Provider at bedside. 

## 2021-06-16 NOTE — Patient Instructions (Addendum)
Pt with severe fatigue, near syncope, sweating,  severe high bp, constipation, heavy vomiting, mild abdomen pain/bloating and tight calf muscles(maybe cramping).   Considering need to rule out bowel obstruction,  dehydration, pancreatitis and bring bp down to reasonable level.   Overall appears not stable as we approach weekend. Think best to be evaluated in the ED for work up. I think would benefit cmp, cbc, abdomen imaging, lipae, possible iv hydration and bp medication if bp not coming down to reasonable level.  Asked staff to call ED and update on presentation.  Follow up with pcp/our office after ED evaluation.

## 2021-06-16 NOTE — ED Provider Notes (Signed)
[name] Provider Note   CSN: [id number] Arrival date & time: 06/16/21  1508     History Chief Complaint  Patient presents with   Emesis    Alyssa [name] is a 70 y.o. female with past medical history of hypertension not on antihypertensives, anxiety and depression that presents to the emerge department today for nausea vomiting and constipation.  Patient states that this morning she started having some hot flashes and felt like she was going to pass out and then started vomiting.  Patient states that she got super sweaty.  Did not actually pass out.  States that she has been having generalized abdominal pain.  Patient states that she has been constipated for the past week, however while in the ER she had a bowel movement.  Nonbloody.  Patient states that she went to go see her PCP today, had an episode of vomiting while in the office and they sent her down to the ED.  Patient's triage note states that she was given Phenergan, however patient denies that this ever happened.  Patient states that she is had 3 aggressive bouts of vomiting today.  Nonbloody.  Patient states abdominal pain is generalized.  Denies any chest pain or shortness of breath.  Denies any regurgitation, cough or URI symptoms.  Denies any urinary or vaginal symptoms.  Patient states that she is never been put on anything for her hypertension.  Patient states that the pain is not severe right now, does not want anything for the pain right now.  HPI     Past Medical History:  Diagnosis Date   Anxiety and depression    Arthritis    Cervicogenic headache    Claustrophobia    Glaucoma    Hypertension    Lumbar radiculopathy    Postsurgical hypothyroidism    Spinal stenosis    Thyroid cancer [facility])    s/p thyroidectomy    Patient Active Problem List   Diagnosis Date Noted   Hyponatremia 06/16/2021   PCP NOTES >>>>>>>>>>> 06/06/2021   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    Spinal stenosis, lumbar region, 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   Status post left foot surgery 05/15/2021   Anxiety and depression 04/26/2021   Cervicogenic headache 04/26/2021   Claustrophobia 04/26/2021   History of lumbar laminectomy 10/31/2020   Other specified glaucoma 10/31/2020   Left lumbar radiculopathy 06/21/2020   Malignant neoplasm of thyroid gland ([place]) 11/03/2019   Postoperative hypothyroidism 11/03/2019   Essential hypertension 12/11/2017    Past Surgical History:  Procedure Laterality Date   FOOT SURGERY     LAMINOTOMY  04/03/2021   LEFT L3/4 LAMINOTOMY BILATERAL L3/4 MEDIAL FACETECTOMY   LUMBAR LAMINECTOMY  03/03/2018   THYROIDECTOMY  12/2004     OB History   No obstetric history on file.     Family History  Problem Relation Age of Onset   Hypertension Sister    Diabetes Paternal Grandmother    Colon cancer Neg Hx    Breast cancer Neg Hx     Social History   Tobacco Use   Smoking status: Never   Smokeless tobacco: Never  Substance Use Topics   Alcohol use: Yes   Drug use: Never    Home Medications Prior to Admission medications   Medication Sig Start Date End Date Taking? Authorizing Provider  acyclovir (ZOVIRAX) 400 MG tablet acyclovir 400 mg tablet  PRN    [provider]  diclofenac Sodium (VOLTAREN) 1 % GEL  Apply 2 g topically 4 (four) times daily. 04/25/21   Tacy Learn, PA-C  escitalopram (LEXAPRO) 10 MG tablet  03/27/21   [provider]  gabapentin (NEURONTIN) 300 MG capsule Take 300 mg by mouth 2 (two) times daily. 03/27/21   [provider]  levothyroxine (SYNTHROID) 112 MCG tablet Take 1 tablet by mouth 6 days weekly 06/07/21   Colon Branch, MD  meloxicam (MOBIC) 7.5 MG tablet Take 7.5 mg by mouth daily. 05/30/21   [provider]  timolol (TIMOPTIC) 0.5 % ophthalmic solution timolol maleate 0.5 % eye drops  once daily 08/15/17   [provider]  traMADol (ULTRAM) 50 MG tablet Take  by mouth. 12/13/20   [provider]  TRAVOPROST OP Apply to eye.    [provider]  zolpidem (AMBIEN CR) 12.5 MG CR tablet Take by mouth as needed.    [provider]    Allergies    Amoxicillin  Review of Systems   Review of Systems  Constitutional:  Negative for chills, diaphoresis, fatigue and fever.  HENT:  Negative for congestion, sore throat and trouble swallowing.   Eyes:  Negative for pain and visual disturbance.  Respiratory:  Negative for cough, shortness of breath and wheezing.   Cardiovascular:  Negative for chest pain, palpitations and leg swelling.  Gastrointestinal:  Positive for abdominal pain, constipation, nausea and vomiting. Negative for abdominal distention and diarrhea.  Genitourinary:  Negative for difficulty urinating.  Musculoskeletal:  Negative for back pain, neck pain and neck stiffness.  Skin:  Negative for pallor.  Neurological:  Negative for dizziness, speech difficulty, weakness and headaches.  Psychiatric/Behavioral:  Negative for confusion.    Physical Exam Updated Vital Signs BP 136/73   Pulse 60   Temp 97.9 F (36.6 C) (Oral)   Resp 15   Ht 5\' 8"  (1.727 m)   Wt 76.9 kg   SpO2 100%   BMI 25.78 kg/m   Physical Exam Constitutional:      General: She is not in acute distress.    Appearance: Normal appearance. She is not ill-appearing, toxic-appearing or diaphoretic.  HENT:     Mouth/Throat:     Mouth: Mucous membranes are moist.     Pharynx: Oropharynx is clear.  Eyes:     General: No scleral icterus.    Extraocular Movements: Extraocular movements intact.     Pupils: Pupils are equal, round, and reactive to light.  Cardiovascular:     Rate and Rhythm: Normal rate and regular rhythm.     Pulses: Normal pulses.     Heart sounds: Normal heart sounds.  Pulmonary:     Effort: Pulmonary effort is normal. No respiratory distress.     Breath sounds: Normal breath sounds. No stridor. No wheezing, rhonchi or rales.   Chest:     Chest wall: No tenderness.  Abdominal:     General: Abdomen is flat. There is no distension.     Palpations: Abdomen is soft.     Tenderness: There is generalized abdominal tenderness. There is no guarding or rebound.  Musculoskeletal:        General: No swelling or tenderness. Normal range of motion.     Cervical back: Normal range of motion and neck supple. No rigidity.     Right lower leg: No edema.     Left lower leg: No edema.  Skin:    General: Skin is warm and dry.     Capillary Refill: Capillary refill takes less  than 2 seconds.     Coloration: Skin is not pale.  Neurological:     General: No focal deficit present.     Mental Status: She is alert and oriented to person, place, and time.  Psychiatric:        Mood and Affect: Mood normal.        Behavior: Behavior normal.    ED Results / Procedures / Treatments   Labs (all labs ordered are listed, but only abnormal results are displayed) Labs Reviewed  CBC WITH DIFFERENTIAL/PLATELET - Abnormal; Notable for the following components:      Result Value   Basophils Absolute 0.2 (*)    All other components within normal limits  COMPREHENSIVE METABOLIC PANEL - Abnormal; Notable for the following components:   Sodium 122 (*)    Chloride 91 (*)    Glucose, Bld 125 (*)    All other components within normal limits  URINALYSIS, ROUTINE W REFLEX MICROSCOPIC - Abnormal; Notable for the following components:   Ketones, ur 15 (*)    Leukocytes,Ua TRACE (*)    All other components within normal limits  URINALYSIS, MICROSCOPIC (REFLEX) - Abnormal; Notable for the following components:   Bacteria, UA MANY (*)    Non Squamous Epithelial PRESENT (*)    All other components within normal limits  RESP PANEL BY RT-PCR (FLU A&B, COVID) ARPGX2  LIPASE, BLOOD    EKG EKG Interpretation  Date/Time:  Friday June 16 2021 15:43:44 EDT Ventricular Rate:  64 PR Interval:  135 QRS Duration: 115 QT Interval:  431 QTC  Calculation: 445 R Axis:   71 Text Interpretation: Sinus rhythm Nonspecific intraventricular conduction delay No previous ECGs available Confirmed by Theotis Burrow [phone]) on 06/16/2021 4:14:19 PM  Radiology CT Abdomen Pelvis W Contrast  Result Date: 06/16/2021 CLINICAL DATA:  Abdominal pain, vomiting, constipation EXAM: CT ABDOMEN AND PELVIS WITH CONTRAST TECHNIQUE: Multidetector CT imaging of the abdomen and pelvis was performed using the standard protocol following bolus administration of intravenous contrast. CONTRAST:  162mL OMNIPAQUE IOHEXOL 300 MG/ML  SOLN COMPARISON:  None. FINDINGS: Lower chest: No acute abnormality. Hepatobiliary: Scattered bilobar hypodense subcentimeter hepatic lesions too small to accurately characterize but statistically likely to represent a benign etiology such as cysts/hemangiomas. Gallbladder is unremarkable. No biliary ductal dilation. Pancreas: Within normal limits. Spleen: Within normal limits. Adrenals/Urinary Tract: Bilateral adrenal glands are unremarkable. No hydronephrosis. Nonobstructive left lower pole renal stone measuring 4 mm. Bilateral hypodense renal lesions technically too small to accurately characterize but statistically likely represent cysts. Urinary bladder is grossly unremarkable. Stomach/Bowel: Mild diffuse gastric wall thickening of an incompletely distended stomach. No pathologic dilation of small bowel. Moderate volume of formed stool throughout the colon. Short segment of apparent colonic wall thickening involving the descending colon on image 35/6 and axial image 24/2, at least in part accentuated by under distension. Vascular/Lymphatic: Aortic atherosclerosis without aneurysmal dilation. No pathologically enlarged abdominal or pelvic lymph nodes. Reproductive: Uterus and bilateral adnexa are unremarkable. Other: No abdominopelvic ascites. Musculoskeletal: High densities carotic stellate lesions in the femoral heads and proximal left femur at the  greater trochanter, favor bone islands. Multilevel degenerative changes spine. No acute osseous abnormality. IMPRESSION: 1. Mild diffuse gastric wall thickening of an incompletely distended stomach, which may represent gastritis. 2. Moderate volume of formed stool throughout the colon, suggestive of constipation. 3. Short segment of apparent colonic wall thickening involving the proximal descending colon, at least in part accentuated by under distension. Consider correlation with colonoscopy, if not  previously performed. 4. Moderate volume of formed stool throughout the colon. 5. Nonobstructive left lower pole renal stone measuring 4 mm. 6.  Aortic Atherosclerosis (ICD10-I70.0). Electronically Signed   By: Dahlia Bailiff MD   On: 06/16/2021 18:47    Procedures Procedures   Medications Ordered in ED Medications  ondansetron [facility]) injection 4 mg (4 mg Intravenous Given 06/16/21 1545)  sodium chloride 0.9 % bolus 1,000 mL (0 mLs Intravenous Stopped 06/16/21 1734)  iohexol (OMNIPAQUE) 300 MG/ML solution 100 mL (100 mLs Intravenous Contrast Given 06/16/21 1805)  pantoprazole (PROTONIX) injection 40 mg (40 mg Intravenous Given 06/16/21 1904)    ED Course  I have reviewed the triage vital signs and the nursing notes.  Pertinent labs & imaging results that were available during my care of the patient were reviewed by me and considered in my medical decision making (see chart for details).    MDM Rules/Calculators/A&P                           MISHEEL GOWANS is a 69 y.o. female with past medical history of hypertension not on antihypertensives, anxiety and depression that presents to the emerge department today for nausea vomiting and constipation.  Patient had 3 episodes of vomiting today with a sweating sensation and a dizziness sensation with generalized abdominal pain. Do not think dizziness is from central causes, most likely from vomiting.  Looks well, slightly hypertensive, patient states that she  is not normally hypertensive however per chart review patient has history of hypertension.  Patient is not actively vomiting on my exam.  Differential to include SBO, constipation, however patient states that right when walking into the ED she had a large bowel movement.    Work-up today with hyponatremia with sodium of 122.  Patient has gotten 1 L, most likely from vomiting and hypovolemia. CT imaging showing signs of gastritis and constipation.  Did give Protonix.  Patient needs to be admitted at this time for hyponatremia, patient admitted to Dr. Hal Hope.  The patient appears reasonably stabilized for admission considering the current resources, flow, and capabilities available in the ED at this time, and I doubt any other [facility] requiring further screening and/or treatment in the ED prior to admission.  I discussed this case with my attending physician who cosigned this note including patient's presenting symptoms, physical exam, and planned diagnostics and interventions. Attending physician stated agreement with plan or made changes to plan which were implemented.   Attending physician assessed patient at bedside.   Final Clinical Impression(s) / ED Diagnoses Final diagnoses:  Hyponatremia  Acute gastritis without hemorrhage, unspecified gastritis type  Other constipation    Rx / DC Orders ED Discharge Orders     None        Alfredia Client, PA-C 06/16/21 1956    Little, Wenda Overland, MD 06/17/21 [phone]

## 2021-06-16 NOTE — ED Notes (Signed)
Report called to [facility], ETA 15 min

## 2021-06-17 DIAGNOSIS — K5900 Constipation, unspecified: Secondary | ICD-10-CM

## 2021-06-17 DIAGNOSIS — E871 Hypo-osmolality and hyponatremia: Secondary | ICD-10-CM

## 2021-06-17 DIAGNOSIS — K2970 Gastritis, unspecified, without bleeding: Secondary | ICD-10-CM

## 2021-06-17 DIAGNOSIS — R112 Nausea with vomiting, unspecified: Secondary | ICD-10-CM

## 2021-06-17 DIAGNOSIS — I10 Essential (primary) hypertension: Secondary | ICD-10-CM

## 2021-06-17 LAB — BASIC METABOLIC PANEL
Anion gap: 9 (ref 5–15)
Anion gap: 7 (ref 5–15)
Anion gap: 6 (ref 5–15)
BUN: 9 mg/dL (ref 8–23)
BUN: 8 mg/dL (ref 8–23)
BUN: 9 mg/dL (ref 8–23)
CO2: 23 mmol/L (ref 22–32)
CO2: 23 mmol/L (ref 22–32)
CO2: 24 mmol/L (ref 22–32)
Calcium: 9.3 mg/dL (ref 8.9–10.3)
Calcium: 9 mg/dL (ref 8.9–10.3)
Calcium: 8.9 mg/dL (ref 8.9–10.3)
Chloride: 100 mmol/L (ref 98–111)
Chloride: 103 mmol/L (ref 98–111)
Chloride: 101 mmol/L (ref 98–111)
Creatinine, Ser: 0.78 mg/dL (ref 0.44–1.00)
Creatinine, Ser: 0.82 mg/dL (ref 0.44–1.00)
Creatinine, Ser: 0.8 mg/dL (ref 0.44–1.00)
GFR, Estimated: >60 mL/min (ref >60)
GFR, Estimated: >60 mL/min (ref >60)
GFR, Estimated: >60 mL/min (ref >60)
Glucose, Bld: 92 mg/dL (ref 70–99)
Glucose, Bld: 94 mg/dL (ref 70–99)
Glucose, Bld: 111 mg/dL — ABNORMAL HIGH (ref 70–99)
Potassium: 3.7 mmol/L (ref 3.5–5.1)
Potassium: 3.9 mmol/L (ref 3.5–5.1)
Potassium: 3.7 mmol/L (ref 3.5–5.1)
Sodium: 132 mmol/L — ABNORMAL LOW (ref 135–145)
Sodium: 133 mmol/L — ABNORMAL LOW (ref 135–145)
Sodium: 131 mmol/L — ABNORMAL LOW (ref 135–145)

## 2021-06-17 LAB — OSMOLALITY: Osmolality: 280 mOsm/kg (ref 275–295)

## 2021-06-17 LAB — TSH: TSH: 1.835 u[IU]/mL (ref 0.350–4.500)

## 2021-06-17 LAB — HIV ANTIBODY (ROUTINE TESTING W REFLEX): HIV Screen 4th Generation wRfx: NONREACTIVE

## 2021-06-17 LAB — CORTISOL: Cortisol, Plasma: 7.8 ug/dL

## 2021-06-17 MED ORDER — ACETAMINOPHEN 325 MG PO TABS
650.0000 mg | ORAL_TABLET | Freq: Four times a day (QID) | ORAL | Status: DC | PRN
  Administered 2021-06-17: 650 mg via ORAL
  Filled 2021-06-17: qty 2

## 2021-06-17 MED ORDER — TRAMADOL HCL 50 MG PO TABS
50.0000 mg | ORAL_TABLET | Freq: Two times a day (BID) | ORAL | Status: DC | PRN
  Administered 2021-06-17: 50 mg via ORAL
  Filled 2021-06-17 (×2): qty 1

## 2021-06-17 MED ORDER — OMEPRAZOLE MAGNESIUM 20 MG PO TBEC: 20.0000 mg | DELAYED_RELEASE_TABLET | Freq: Every day | ORAL | 0 refills | Status: DC

## 2021-06-17 MED ORDER — ONDANSETRON HCL 4 MG/2ML IJ SOLN: 4.0000 mg | Freq: Four times a day (QID) | INTRAMUSCULAR | Status: DC | PRN

## 2021-06-17 MED ORDER — TRAMADOL HCL 50 MG PO TABS
50.0000 mg | ORAL_TABLET | Freq: Four times a day (QID) | ORAL | Status: DC | PRN
  Administered 2021-06-17: 50 mg via ORAL
  Filled 2021-06-17: qty 1

## 2021-06-17 MED ORDER — ENOXAPARIN SODIUM 40 MG/0.4ML IJ SOSY
40.0000 mg | PREFILLED_SYRINGE | INTRAMUSCULAR | Status: DC
  Filled 2021-06-17: qty 0.4

## 2021-06-17 MED ORDER — POLYETHYLENE GLYCOL 3350 17 G PO PACK: 17.0000 g | PACK | Freq: Every day | ORAL | Status: DC | PRN

## 2021-06-17 MED ORDER — GABAPENTIN 300 MG PO CAPS
300.0000 mg | ORAL_CAPSULE | Freq: Two times a day (BID) | ORAL | Status: DC
  Administered 2021-06-17 (×2): 300 mg via ORAL
  Filled 2021-06-17 (×2): qty 1

## 2021-06-17 MED ORDER — ACETAMINOPHEN 650 MG RE SUPP: 650.0000 mg | Freq: Four times a day (QID) | RECTAL | Status: DC | PRN

## 2021-06-17 MED ORDER — LEVOTHYROXINE SODIUM 112 MCG PO TABS: 112.0000 ug | ORAL_TABLET | ORAL | Status: DC

## 2021-06-17 MED ORDER — SODIUM CHLORIDE 0.9 % IV SOLN: INTRAVENOUS | Status: DC

## 2021-06-17 MED ORDER — ESCITALOPRAM OXALATE 10 MG PO TABS
10.0000 mg | ORAL_TABLET | Freq: Every day | ORAL | Status: DC
  Administered 2021-06-17: 10 mg via ORAL
  Filled 2021-06-17: qty 1

## 2021-06-17 MED ORDER — DEXTROSE 5 % IV SOLN: INTRAVENOUS | Status: DC

## 2021-06-17 MED ORDER — PANTOPRAZOLE SODIUM 40 MG IV SOLR
40.0000 mg | Freq: Two times a day (BID) | INTRAVENOUS | Status: DC
  Administered 2021-06-17: 40 mg via INTRAVENOUS
  Filled 2021-06-17: qty 40

## 2021-06-17 NOTE — Progress Notes (Signed)
Pt. Discharge via wheelchair accompanied by staff, pt. Is alert and oriented not in distress. Discharge instruction and education given, pt. Verbalized understanding. All personal belongings are with the patient.

## 2021-06-17 NOTE — Plan of Care (Signed)
  Problem: Education: Goal: Knowledge of General Education information will improve Description: Including pain rating scale, medication(s)/side effects and non-pharmacologic comfort measures Outcome: Adequate for Discharge   

## 2021-06-17 NOTE — H&P (Signed)
History and Physical    Alyssa [name] BOF:[id number] DOB: 14-Mar-1951 DOA: 06/16/2021  PCP: Colon Branch, MD Patient coming from: [facility]  Chief Complaint: Nausea, vomiting, constipation  HPI: Alyssa [name] is a 70 y.o. female with medical history significant of hypertension, thyroid cancer status post thyroidectomy, anxiety, depression presented to the ED with complaints of nausea, vomiting, constipation, generalized abdominal pain, dizziness, and sweating.  On arrival to the ED, she had a large bowel movement.  Labs showing WBC 6.7, hemoglobin 14.6, platelet count 326K.  Sodium 122, potassium 3.9, chloride 91, bicarb 23, BUN 10, creatinine 0.7, glucose 125.  Lipase and LFTs normal.  UA with negative nitrite, trace leukocytes, 0-5 WBCs, and many bacteria.  COVID and influenza PCR negative.  CT abdomen pelvis showing findings consistent with gastritis and constipation. Patient was given Zofran, Protonix, and 1 L normal saline bolus.  Patient reports 2-day history of having spells where she starts sweating, dizziness, and feels like she is going to pass out.  Also having nausea, vomiting, constipation, and left-sided abdominal pain.  Not eating much.  She did have a bowel movement on arrival to the emergency room.  Denies fevers.  Denies cough, shortness of breath, or chest pain.  She does report taking meloxicam for the past few months for SI joint arthritis pain.  Review of Systems:  All systems reviewed and apart from history of presenting illness, are negative.  Past Medical History:  Diagnosis Date   Anxiety and depression    Arthritis    Cervicogenic headache    Claustrophobia    Glaucoma    Hypertension    Lumbar radiculopathy    Postsurgical hypothyroidism    Spinal stenosis    Thyroid cancer [facility])    s/p thyroidectomy    Past Surgical History:  Procedure Laterality Date   FOOT SURGERY     LAMINOTOMY  04/03/2021   LEFT L3/4 LAMINOTOMY BILATERAL L3/4 MEDIAL FACETECTOMY   LUMBAR  LAMINECTOMY  03/03/2018   THYROIDECTOMY  12/2004     reports that she has never smoked. She has never used smokeless tobacco. She reports current alcohol use. She reports that she does not use drugs.  Allergies  Allergen Reactions   Amoxicillin Hives    Family History  Problem Relation Age of Onset   Hypertension Sister    Diabetes Paternal Grandmother    Colon cancer Neg Hx    Breast cancer Neg Hx     Prior to Admission medications   Medication Sig Start Date End Date Taking? Authorizing Provider  diclofenac Sodium (VOLTAREN) 1 % GEL Apply 2 g topically 4 (four) times daily. 04/25/21  Yes Tacy Learn, PA-C  escitalopram (LEXAPRO) 10 MG tablet  03/27/21  Yes [provider]  gabapentin (NEURONTIN) 300 MG capsule Take 300 mg by mouth 2 (two) times daily. 03/27/21  Yes [provider]  levothyroxine (SYNTHROID) 112 MCG tablet Take 1 tablet by mouth 6 days weekly 06/07/21  Yes Paz, Alda Berthold, MD  meloxicam (MOBIC) 7.5 MG tablet Take 7.5 mg by mouth daily. 05/30/21  Yes [provider]  timolol (TIMOPTIC) 0.5 % ophthalmic solution timolol maleate 0.5 % eye drops  once daily 08/15/17  Yes [provider]  traMADol (ULTRAM) 50 MG tablet Take by mouth. 12/13/20  Yes [provider]  TRAVOPROST OP Apply to eye.   Yes [provider]  zolpidem (AMBIEN CR) 12.5 MG CR tablet Take by mouth as needed.   Yes [provider]  acyclovir (ZOVIRAX) 400 MG tablet acyclovir 400 mg tablet  PRN    [provider]    Physical Exam: Vitals:   06/16/21 1757 06/16/21 1900 06/16/21 2134 06/16/21 2249  BP:  136/73 125/79 (!) 143/74  Pulse: 61 60 (!) 59 (!) 57  Resp:  15 14 18   Temp:    98.1 F (36.7 C)  TempSrc:      SpO2:  100% 100% 99%  Weight:    75.2 kg  Height:        Physical Exam Constitutional:      General: She is not in acute distress. HENT:     Head: Normocephalic and atraumatic.  Eyes:     Extraocular Movements:  Extraocular movements intact.     Conjunctiva/sclera: Conjunctivae normal.  Cardiovascular:     Rate and Rhythm: Normal rate and regular rhythm.     Pulses: Normal pulses.  Pulmonary:     Effort: Pulmonary effort is normal. No respiratory distress.     Breath sounds: Normal breath sounds. No wheezing or rales.  Abdominal:     General: Bowel sounds are normal. There is no distension.     Palpations: Abdomen is soft.     Tenderness: There is no abdominal tenderness.  Musculoskeletal:        General: No swelling or tenderness.     Cervical back: Normal range of motion and neck supple.  Skin:    General: Skin is warm and dry.  Neurological:     General: No focal deficit present.     Mental Status: She is alert and oriented to person, place, and time.     Labs on Admission: I have personally reviewed following labs and imaging studies  CBC: Recent Labs  Lab 06/16/21 1538  WBC 6.7  NEUTROABS 4.6  HGB 14.6  HCT 41.4  MCV 83.8  PLT 947   Basic Metabolic Panel: Recent Labs  Lab 06/16/21 1538  NA 122*  K 3.9  CL 91*  CO2 23  GLUCOSE 125*  BUN 10  CREATININE 0.72  CALCIUM 9.2   GFR: Estimated Creatinine Clearance: 67.9 mL/min (by C-G formula based on SCr of 0.72 mg/dL). Liver Function Tests: Recent Labs  Lab 06/16/21 1538  AST 30  ALT 26  ALKPHOS 86  BILITOT 0.8  PROT 7.4  ALBUMIN 4.4   Recent Labs  Lab 06/16/21 1538  LIPASE 31   No results for input(s): AMMONIA in the last 168 hours. Coagulation Profile: No results for input(s): INR, PROTIME in the last 168 hours. Cardiac Enzymes: No results for input(s): CKTOTAL, CKMB, CKMBINDEX, TROPONINI in the last 168 hours. BNP (last 3 results) No results for input(s): PROBNP in the last 8760 hours. HbA1C: No results for input(s): HGBA1C in the last 72 hours. CBG: No results for input(s): GLUCAP in the last 168 hours. Lipid Profile: No results for input(s): CHOL, HDL, LDLCALC, TRIG, CHOLHDL, LDLDIRECT in the  last 72 hours. Thyroid Function Tests: No results for input(s): TSH, T4TOTAL, FREET4, T3FREE, THYROIDAB in the last 72 hours. Anemia Panel: No results for input(s): VITAMINB12, FOLATE, FERRITIN, TIBC, IRON, RETICCTPCT in the last 72 hours. Urine analysis:    Component Value Date/Time   COLORURINE YELLOW 06/16/2021 1528   APPEARANCEUR CLEAR 06/16/2021 1528   LABSPEC 1.010 06/16/2021 1528   PHURINE 7.0 06/16/2021 1528   GLUCOSEU NEGATIVE 06/16/2021 1528   HGBUR NEGATIVE 06/16/2021 [place] 06/16/2021 1528   KETONESUR 15 (A) 06/16/2021 1528  PROTEINUR NEGATIVE 06/16/2021 1528   NITRITE NEGATIVE 06/16/2021 1528   LEUKOCYTESUR TRACE (A) 06/16/2021 1528    Radiological Exams on Admission: CT Abdomen Pelvis W Contrast  Result Date: 06/16/2021 CLINICAL DATA:  Abdominal pain, vomiting, constipation EXAM: CT ABDOMEN AND PELVIS WITH CONTRAST TECHNIQUE: Multidetector CT imaging of the abdomen and pelvis was performed using the standard protocol following bolus administration of intravenous contrast. CONTRAST:  159mL OMNIPAQUE IOHEXOL 300 MG/ML  SOLN COMPARISON:  None. FINDINGS: Lower chest: No acute abnormality. Hepatobiliary: Scattered bilobar hypodense subcentimeter hepatic lesions too small to accurately characterize but statistically likely to represent a benign etiology such as cysts/hemangiomas. Gallbladder is unremarkable. No biliary ductal dilation. Pancreas: Within normal limits. Spleen: Within normal limits. Adrenals/Urinary Tract: Bilateral adrenal glands are unremarkable. No hydronephrosis. Nonobstructive left lower pole renal stone measuring 4 mm. Bilateral hypodense renal lesions technically too small to accurately characterize but statistically likely represent cysts. Urinary bladder is grossly unremarkable. Stomach/Bowel: Mild diffuse gastric wall thickening of an incompletely distended stomach. No pathologic dilation of small bowel. Moderate volume of formed stool  throughout the colon. Short segment of apparent colonic wall thickening involving the descending colon on image 35/6 and axial image 24/2, at least in part accentuated by under distension. Vascular/Lymphatic: Aortic atherosclerosis without aneurysmal dilation. No pathologically enlarged abdominal or pelvic lymph nodes. Reproductive: Uterus and bilateral adnexa are unremarkable. Other: No abdominopelvic ascites. Musculoskeletal: High densities carotic stellate lesions in the femoral heads and proximal left femur at the greater trochanter, favor bone islands. Multilevel degenerative changes spine. No acute osseous abnormality. IMPRESSION: 1. Mild diffuse gastric wall thickening of an incompletely distended stomach, which may represent gastritis. 2. Moderate volume of formed stool throughout the colon, suggestive of constipation. 3. Short segment of apparent colonic wall thickening involving the proximal descending colon, at least in part accentuated by under distension. Consider correlation with colonoscopy, if not previously performed. 4. Moderate volume of formed stool throughout the colon. 5. Nonobstructive left lower pole renal stone measuring 4 mm. 6.  Aortic Atherosclerosis (ICD10-I70.0). Electronically Signed   By: Dahlia Bailiff MD   On: 06/16/2021 18:47    EKG: Independently reviewed.  Sinus rhythm, no prior tracing for comparison.  Assessment/Plan Principal Problem:   Hyponatremia Active Problems:   Essential hypertension   Gastritis   Constipation   Nausea and vomiting   Hyponatremia Likely due to vomiting and hypovolemia.  Sodium 122 and was normal on labs done on 06/05/2021. -Continue IV fluid hydration with normal saline.  Monitor BMP every 4 hours.  Goal rate of correction 4 to 6 mEq in 24 hours.  Check serum osmolarity, urine sodium, urine osmolarity, TSH, and cortisol levels.  Gastritis Nausea and vomiting Seen on CT and likely etiology of GI symptoms.  Takes meloxicam chronically  for arthritis.  Lipase and LFTs normal.  COVID and influenza PCR negative. -IV Protonix 40 mg twice daily, antiemetic as needed.  Patient has been counseled to stop taking NSAIDs. -CT does show short segment of apparent colonic wall thickening involving the proximal descending colon, at least in part accentuated by under distension.  Do not see any prior colonoscopy results in the chart.  Outpatient GI follow-up.  Constipation Did have a large bowel movement on arrival to the ED.  CT showing moderate stool burden. -MiraLAX as needed  Dizziness/sweating/near syncope Possibly due to hypovolemia.  Denies chest pain or shortness of breath.  EKG showing sinus rhythm.  Not hypoxic.  Not symptomatic at present. -Check orthostatics  Hypertension  Stable at present.  Not on antihypertensives at home. -Checking orthostatics as mentioned above  Hypothyroidism -Continue Synthroid and check TSH level.  Anxiety, depression -Continue Lexapro  Chronic pain -Avoid NSAIDs given gastritis.  Continue home tramadol.  Abnormal urinalysis UA with negative nitrite, trace leukocytes, 0-5 WBCs, and many bacteria.  No fever or leukocytosis.  Patient is not endorsing any UTI symptoms.   -Urine culture ordered  DVT prophylaxis: Lovenox Code Status: Patient wishes to be full code. Family Communication: No family available at this time. Disposition Plan: Status is: Observation  The patient remains OBS appropriate and will d/c before 2 midnights.  Dispo: The patient is from: Home              Anticipated d/c is to: Home              Patient currently is not medically stable to d/c.   Difficult to place patient No  Level of care: Level of care: Telemetry  The medical decision making on this patient was of high complexity and the patient is at high risk for clinical deterioration, therefore this is a level 3 visit.  Shela Leff MD [facility]  If 7PM-7AM, please contact  night-coverage [email or web address]  06/17/2021, 2:22 AM

## 2021-06-17 NOTE — Discharge Summary (Signed)
Physician Discharge Summary  Alyssa [name] OXB:[id number] DOB: 05-17-1951 DOA: 06/16/2021  PCP: Colon Branch, MD  Admit date: 06/16/2021 Discharge date: 06/17/2021  Admitted From: Home Disposition: Home   Recommendations for Outpatient Follow-up:  Follow up with PCP in 1-2 weeks Please obtain BMP at follow up Follow up with GI for gastritis. Modify pain control in absence of NSAIDs. Consider repeat imaging vs. other work up of descending colon wall thickening.   Home Health: None Equipment/Devices: None Discharge Condition: Stable CODE STATUS: Full Diet recommendation: Heart healthy  Brief/Interim Summary: Alyssa [name] is a 70 y.o. female with a history of HTN, thyroid cancer s/p thyroidectomy, anxiety and depression who moved to [place] from [place] about 1 month ago, who presented to the ED 7/8 with nausea, vomiting and constipation. In the ED she had a large BM and has had no significant vomiting since that time. Sodium was 122. CT abdomen pelvis showing findings consistent with gastritis and constipation with some descending colon thickening. Isotonic saline was given with abrupt increase in sodium that has been stabilized with additional D5W. Symptoms remain resolved, she remains asymptomatic from electrolyte standpoint. She is discharged in stable condition with plans to follow up with GI having been treated for acute hyponatremia due to dehydration due to vomiting.   Discharge Diagnoses:  Principal Problem:   Hyponatremia Active Problems:   Essential hypertension   Gastritis   Constipation   Nausea and vomiting  Hyponatremia: Appears to be acute. Significantly improved with isotonic saline. Asymptomatic. No further vomiting. Urine studies were not collected by time of discharge. TSH, cortisol, and serum osmolality are wnl.    Gastritis: NSAID-induced most likely. Recommended discontinuing long term mobic, replacing with tylenol, has also been taking tramadol for arthritis pain.  Start PPI. Recommended GI follow up.  Colonic thickening: Nonspecific, seen on CT. Had colonoscopy in Nov 2021 which revealed polyps, recommended repeat in 3-5 years per patient. No bleeding.  - GI follow up.    Constipation: Resolved. Continue regular regimen.   Dizziness/sweating/near syncope Possibly due to hypovolemia.  Denies chest pain or shortness of breath.  EKG showing sinus rhythm.  Not hypoxic.  Not symptomatic at present. Orthostatic vital signs are normal.    Hypertension: Stable.    Hypothyroidism - Continue synthroid   Anxiety, depression - Continue lexapro, consider discontinuing if hyponatremia persists.    Osteoarthritis, particularly SI joint arthritis:  - Continue tylenol. Avoid NSAIDs given gastritis.  Continue home tramadol.   Asymptomatic bacteriuria: No pyuria or symptoms.   Discharge Instructions Discharge Instructions     Diet general   Complete by: As directed    Discharge instructions   Complete by: As directed    You were admitted with nausea and vomiting that caused acute low sodium level. Imaging suggests you have gastritis which should be treated with a proton pump inhibitor (omeprazole) to reduce stomach acid, and avoidance of NSAIDs, particularly mobic (meloxicam). Your symptoms have improved, sodium level improved and stabilized, so you are cleared for discharge.   It is important that you follow up with your doctor to continue efforts at pain control while avoid NSAIDs. Tylenol is encouraged.  Also please follow up with a gastroenterology doctor for gastritis and thickening of a segment of colon wall seen on CT scan.   You will need recheck of sodium level in the next week.  Seek medical attention right away if your symptoms return.      Allergies as of 06/17/2021  Reactions   Amoxicillin Hives        Medication List     STOP taking these medications    meloxicam 7.5 MG tablet Commonly known as: MOBIC       TAKE these  medications    acyclovir 400 MG tablet Commonly known as: ZOVIRAX acyclovir 400 mg tablet  PRN   diclofenac Sodium 1 % Gel Commonly known as: VOLTAREN Apply 2 g topically 4 (four) times daily.   escitalopram 10 MG tablet Commonly known as: LEXAPRO   gabapentin 300 MG capsule Commonly known as: NEURONTIN Take 300 mg by mouth 2 (two) times daily.   levothyroxine 112 MCG tablet Commonly known as: SYNTHROID Take 1 tablet by mouth 6 days weekly   omeprazole 20 MG tablet Commonly known as: PriLOSEC OTC Take 1 tablet (20 mg total) by mouth daily.   timolol 0.5 % ophthalmic solution Commonly known as: TIMOPTIC timolol maleate 0.5 % eye drops  once daily   traMADol 50 MG tablet Commonly known as: ULTRAM Take by mouth.   TRAVOPROST OP Apply to eye.   zolpidem 12.5 MG CR tablet Commonly known as: AMBIEN CR Take by mouth as needed.        Allergies  Allergen Reactions   Amoxicillin Hives    Consultations: None  Procedures/Studies: CT Abdomen Pelvis W Contrast  Result Date: 06/16/2021 CLINICAL DATA:  Abdominal pain, vomiting, constipation EXAM: CT ABDOMEN AND PELVIS WITH CONTRAST TECHNIQUE: Multidetector CT imaging of the abdomen and pelvis was performed using the standard protocol following bolus administration of intravenous contrast. CONTRAST:  168mL OMNIPAQUE IOHEXOL 300 MG/ML  SOLN COMPARISON:  None. FINDINGS: Lower chest: No acute abnormality. Hepatobiliary: Scattered bilobar hypodense subcentimeter hepatic lesions too small to accurately characterize but statistically likely to represent a benign etiology such as cysts/hemangiomas. Gallbladder is unremarkable. No biliary ductal dilation. Pancreas: Within normal limits. Spleen: Within normal limits. Adrenals/Urinary Tract: Bilateral adrenal glands are unremarkable. No hydronephrosis. Nonobstructive left lower pole renal stone measuring 4 mm. Bilateral hypodense renal lesions technically too small to accurately  characterize but statistically likely represent cysts. Urinary bladder is grossly unremarkable. Stomach/Bowel: Mild diffuse gastric wall thickening of an incompletely distended stomach. No pathologic dilation of small bowel. Moderate volume of formed stool throughout the colon. Short segment of apparent colonic wall thickening involving the descending colon on image 35/6 and axial image 24/2, at least in part accentuated by under distension. Vascular/Lymphatic: Aortic atherosclerosis without aneurysmal dilation. No pathologically enlarged abdominal or pelvic lymph nodes. Reproductive: Uterus and bilateral adnexa are unremarkable. Other: No abdominopelvic ascites. Musculoskeletal: High densities carotic stellate lesions in the femoral heads and proximal left femur at the greater trochanter, favor bone islands. Multilevel degenerative changes spine. No acute osseous abnormality. IMPRESSION: 1. Mild diffuse gastric wall thickening of an incompletely distended stomach, which may represent gastritis. 2. Moderate volume of formed stool throughout the colon, suggestive of constipation. 3. Short segment of apparent colonic wall thickening involving the proximal descending colon, at least in part accentuated by under distension. Consider correlation with colonoscopy, if not previously performed. 4. Moderate volume of formed stool throughout the colon. 5. Nonobstructive left lower pole renal stone measuring 4 mm. 6.  Aortic Atherosclerosis (ICD10-I70.0). Electronically Signed   By: Dahlia Bailiff MD   On: 06/16/2021 18:47     Subjective: No vomiting, feels very well and wants to go home. No HA or dizziness.   Discharge Exam: Vitals:   06/17/21 0640 06/17/21 1052  BP: 127/68 138/74  Pulse: Marland Kitchen)  55 66  Resp: 18 16  Temp: 97.7 F (36.5 C) 97.8 F (36.6 C)  SpO2: 100% 99%   General: Pt is alert, awake, not in acute distress Cardiovascular: RRR, S1/S2 +, no rubs, no gallops Respiratory: CTA bilaterally, no  wheezing, no rhonchi Abdominal: Soft, NT, ND, bowel sounds + Extremities: No edema, no cyanosis  Labs: BNP (last 3 results) No results for input(s): BNP in the last 8760 hours. Basic Metabolic Panel: Recent Labs  Lab 06/16/21 1538 06/17/21 0202 06/17/21 0520 06/17/21 1145  NA 122* 132* 133* 131*  K 3.9 3.7 3.9 3.7  CL 91* 100 103 101  CO2 23 23 23 24   GLUCOSE 125* 92 94 111*  BUN 10 9 8 9   CREATININE 0.72 0.78 0.82 0.80  CALCIUM 9.2 9.3 9.0 8.9   Liver Function Tests: Recent Labs  Lab 06/16/21 1538  AST 30  ALT 26  ALKPHOS 86  BILITOT 0.8  PROT 7.4  ALBUMIN 4.4   Recent Labs  Lab 06/16/21 1538  LIPASE 31   No results for input(s): AMMONIA in the last 168 hours. CBC: Recent Labs  Lab 06/16/21 1538  WBC 6.7  NEUTROABS 4.6  HGB 14.6  HCT 41.4  MCV 83.8  PLT 326   Cardiac Enzymes: No results for input(s): CKTOTAL, CKMB, CKMBINDEX, TROPONINI in the last 168 hours. BNP: Invalid input(s): POCBNP CBG: No results for input(s): GLUCAP in the last 168 hours. D-Dimer No results for input(s): DDIMER in the last 72 hours. Hgb A1c No results for input(s): HGBA1C in the last 72 hours. Lipid Profile No results for input(s): CHOL, HDL, LDLCALC, TRIG, CHOLHDL, LDLDIRECT in the last 72 hours. Thyroid function studies Recent Labs    06/17/21 0202  TSH 1.835   Anemia work up No results for input(s): VITAMINB12, FOLATE, FERRITIN, TIBC, IRON, RETICCTPCT in the last 72 hours. Urinalysis    Component Value Date/Time   COLORURINE YELLOW 06/16/2021 1528   APPEARANCEUR CLEAR 06/16/2021 1528   LABSPEC 1.010 06/16/2021 1528   PHURINE 7.0 06/16/2021 1528   GLUCOSEU NEGATIVE 06/16/2021 1528   HGBUR NEGATIVE 06/16/2021 [place] 06/16/2021 1528   KETONESUR 15 (A) 06/16/2021 1528   PROTEINUR NEGATIVE 06/16/2021 1528   NITRITE NEGATIVE 06/16/2021 1528   LEUKOCYTESUR TRACE (A) 06/16/2021 1528    Microbiology Recent Results (from the past 240  hour(s))  Resp Panel by RT-PCR (Flu A&B, Covid) Nasopharyngeal Swab     Status: None   Collection Time: 06/16/21  6:43 PM   Specimen: Nasopharyngeal Swab; Nasopharyngeal(NP) swabs in vial transport medium  Result Value Ref Range Status   SARS Coronavirus 2 by RT PCR NEGATIVE NEGATIVE Final    Comment: (NOTE) SARS-CoV-2 target nucleic acids are NOT DETECTED.  The SARS-CoV-2 RNA is generally detectable in upper respiratory specimens during the acute phase of infection. The lowest concentration of SARS-CoV-2 viral copies this assay can detect is 138 copies/mL. A negative result does not preclude SARS-Cov-2 infection and should not be used as the sole basis for treatment or other patient management decisions. A negative result may occur with  improper specimen collection/handling, submission of specimen other than nasopharyngeal swab, presence of viral mutation(s) within the areas targeted by this assay, and inadequate number of viral copies(<138 copies/mL). A negative result must be combined with clinical observations, patient history, and epidemiological information. The expected result is Negative.  Fact Sheet for Patients:  EntrepreneurPulse.com.au  Fact Sheet for Healthcare Providers:  IncredibleEmployment.be  This test is no  t yet approved or cleared by the [place] and  has been authorized for detection and/or diagnosis of SARS-CoV-2 by FDA under an Emergency Use Authorization (EUA). This EUA will remain  in effect (meaning this test can be used) for the duration of the COVID-19 declaration under Section 564(b)(1) of the Act, 21 U.S.C.section 360bbb-3(b)(1), unless the authorization is terminated  or revoked sooner.       Influenza A by PCR NEGATIVE NEGATIVE Final   Influenza B by PCR NEGATIVE NEGATIVE Final    Comment: (NOTE) The Xpert Xpress SARS-CoV-2/FLU/RSV plus assay is intended as an aid in the diagnosis of influenza from  Nasopharyngeal swab specimens and should not be used as a sole basis for treatment. Nasal washings and aspirates are unacceptable for Xpert Xpress SARS-CoV-2/FLU/RSV testing.  Fact Sheet for Patients: EntrepreneurPulse.com.au  Fact Sheet for Healthcare Providers: IncredibleEmployment.be  This test is not yet approved or cleared by the [place] FDA and has been authorized for detection and/or diagnosis of SARS-CoV-2 by FDA under an Emergency Use Authorization (EUA). This EUA will remain in effect (meaning this test can be used) for the duration of the COVID-19 declaration under Section 564(b)(1) of the Act, 21 U.S.C. section 360bbb-3(b)(1), unless the authorization is terminated or revoked.  Performed at [facility], [address] [place], [place] [address]     Time coordinating discharge: Approximately 40 minutes  Patrecia Pour, MD  [facility] 06/17/2021, 1:09 PM

## 2021-06-17 NOTE — Plan of Care (Signed)
Patient educated of unit  upon arrival.

## 2021-06-19 ENCOUNTER — Telehealth: Payer: Self-pay

## 2021-06-19 NOTE — Telephone Encounter (Signed)
Transition Care Management Unsuccessful Follow-up Telephone Call  Date of discharge and from where:  06/17/2021-[facility]  Attempts:  1st Attempt  Reason for unsuccessful TCM follow-up call:  Left voice message

## 2021-06-20 ENCOUNTER — Telehealth: Payer: Self-pay | Admitting: *Deleted

## 2021-06-20 ENCOUNTER — Telehealth: Payer: Self-pay

## 2021-06-20 NOTE — Telephone Encounter (Signed)
Transition Care Management Follow-up Telephone Call Date of discharge and from where: 06/17/2021-[facility] How have you been since you were released from the hospital? Doing ok but still having some constipation. Any questions or concerns? No  Items Reviewed: Did the pt receive and understand the discharge instructions provided? Yes  Medications obtained and verified? Yes  Other? Yes  Any new allergies since your discharge? No  Dietary orders reviewed? Yes Do you have support at home? Yes   Home Care and Equipment/Supplies: Were home health services ordered? no If so, what is the name of the agency? N/a  Has the agency set up a time to come to the patient's home? not applicable Were any new equipment or medical supplies ordered?  No What is the name of the medical supply agency? N/a Were you able to get the supplies/equipment? not applicable Do you have any questions related to the use of the equipment or supplies? N/a  Functional Questionnaire: (I = Independent and D = Dependent) ADLs: I  Bathing/Dressing- I  Meal Prep- I  Eating- I  Maintaining continence- I  Transferring/Ambulation- I  Managing Meds- I  Follow up appointments reviewed:  PCP Hospital f/u appt confirmed? Yes  Scheduled to see Dr. Larose Kells on 06/21/21 @ 3:20. [facility] f/u appt confirmed? No  Patient to make an appt with GI Are transportation arrangements needed? No  If their condition worsens, is the pt aware to call PCP or go to the Emergency Dept.? Yes Was the patient provided with contact information for the PCP's office or ED? Yes Was to pt encouraged to call back with questions or concerns? Yes

## 2021-06-20 NOTE — Telephone Encounter (Signed)
Who Is Calling Patient / Member / Family / Caregiver Caller Name Alyssa [name] Caller Phone Number [phone] Call Type Message Only Information Provided Reason for Call Returning a Call from the Office Initial [place] states she missed a call from the office. She needs to know the time of her upcoming appointment. Additional Comment Provided office hours. Disp. Time Disposition Final User 06/19/2021 5:33:00 PM General Information Provided Yes [name]

## 2021-06-20 NOTE — Telephone Encounter (Signed)
LMOM informing of appt time and date tomorrow for hosp f/u.

## 2021-06-21 ENCOUNTER — Other Ambulatory Visit: Payer: Self-pay

## 2021-06-21 ENCOUNTER — Ambulatory Visit (INDEPENDENT_AMBULATORY_CARE_PROVIDER_SITE_OTHER): Payer: Medicare Other | Admitting: Internal Medicine

## 2021-06-21 ENCOUNTER — Encounter: Payer: Self-pay | Admitting: Internal Medicine

## 2021-06-21 VITALS — BP 136/78 | HR 76 | Temp 97.8°F | Resp 16 | Ht 68.0 in | Wt 165.5 lb

## 2021-06-21 DIAGNOSIS — K2900 Acute gastritis without bleeding: Secondary | ICD-10-CM

## 2021-06-21 DIAGNOSIS — R112 Nausea with vomiting, unspecified: Secondary | ICD-10-CM

## 2021-06-21 DIAGNOSIS — R933 Abnormal findings on diagnostic imaging of other parts of digestive tract: Secondary | ICD-10-CM | POA: Diagnosis not present

## 2021-06-21 DIAGNOSIS — Z8719 Personal history of other diseases of the digestive system: Secondary | ICD-10-CM | POA: Diagnosis not present

## 2021-06-21 DIAGNOSIS — E871 Hypo-osmolality and hyponatremia: Secondary | ICD-10-CM | POA: Diagnosis not present

## 2021-06-21 DIAGNOSIS — K5909 Other constipation: Secondary | ICD-10-CM

## 2021-06-21 NOTE — Progress Notes (Signed)
Subjective:    Patient ID: Alyssa [name], female    DOB: 01-07-1951, 70 y.o.   MRN: [id number]  DOS:  06/21/2021 Type of visit - description: Hospital follow-up, TCM 7  Admitted to the hospital 06/16/2021, discharged the next day.  She presented with nausea, vomiting, constipation. At the ED had a large BM. Sodium was noted to be 122.  She was appropriately hydrated, issue felt to be due to dehydration, vomiting. CT of the abdomen showed gastritis and descending colon thickening.  Since she left the hospital, she continue having problems with constipation, not able to have a BM daily.  Stools are somewhat hard. This seems to be a chronic issue. She knows tramadol causes constipation and is taking it only once daily.   Review of Systems No fever chills No more nausea or vomiting.  No diarrhea intermittent with constipation.  No blood in the stools. No headache or dizziness    Past Medical History:  Diagnosis Date   Anxiety and depression    Arthritis    Cervicogenic headache    Claustrophobia    Glaucoma    Hypertension    Lumbar radiculopathy    Postsurgical hypothyroidism    Spinal stenosis    Thyroid cancer [facility])    s/p thyroidectomy    Past Surgical History:  Procedure Laterality Date   FOOT SURGERY     LAMINOTOMY  04/03/2021   LEFT L3/4 LAMINOTOMY BILATERAL L3/4 MEDIAL FACETECTOMY   LUMBAR LAMINECTOMY  03/03/2018   THYROIDECTOMY  12/2004    Allergies as of 06/21/2021       Reactions   Amoxicillin Hives        Medication List        Accurate as of June 21, 2021 11:59 PM. If you have any questions, ask your nurse or doctor.          acyclovir 400 MG tablet Commonly known as: ZOVIRAX acyclovir 400 mg tablet  PRN   diclofenac Sodium 1 % Gel Commonly known as: VOLTAREN Apply 2 g topically 4 (four) times daily.   escitalopram 10 MG tablet Commonly known as: LEXAPRO   gabapentin 300 MG capsule Commonly known as: NEURONTIN Take 300 mg by  mouth 2 (two) times daily.   levothyroxine 112 MCG tablet Commonly known as: SYNTHROID Take 1 tablet by mouth 6 days weekly   omeprazole 20 MG tablet Commonly known as: PriLOSEC OTC Take 1 tablet (20 mg total) by mouth daily.   timolol 0.5 % ophthalmic solution Commonly known as: TIMOPTIC timolol maleate 0.5 % eye drops  once daily   traMADol 50 MG tablet Commonly known as: ULTRAM Take by mouth.   TRAVOPROST OP Apply to eye.   zolpidem 12.5 MG CR tablet Commonly known as: AMBIEN CR Take by mouth as needed.           Objective:   Physical Exam BP 136/78 (BP Location: Left Arm, Patient Position: Sitting, Cuff Size: Small)   Pulse 76   Temp 97.8 F (36.6 C) (Oral)   Resp 16   Ht 5\' 8"  (1.727 m)   Wt 165 lb 8 oz (75.1 kg)   SpO2 97%   BMI 25.16 kg/m  General:   Well developed, NAD, BMI noted.  HEENT:  Normocephalic . Face symmetric, atraumatic Lungs:  CTA B Normal respiratory effort, no intercostal retractions, no accessory muscle use. Heart: RRR,  no murmur.  Abdomen:  Not distended, soft, moderately tender to palpation throughout the colon area, no  mass or rebound Skin: Not pale. Not jaundice Lower extremities: no pretibial edema bilaterally  Neurologic:  alert & oriented X3.  Speech normal, gait appropriate for age and unassisted Psych--  Cognition and judgment appear intact.  Cooperative with normal attention span and concentration.  Behavior appropriate. No anxious or depressed appearing.     Assessment     Assessment H/o  HTN anxiety related, no meds  Anxiety Thyroid cancer Dx 2006, total thyroidectomy 2006. Glaucoma MSK: Back surgery 03/2021, DJD, foot surgery, cervical stenosis Herpes, buttocks, prn valtrex  Osteopenia: T score -1.214 2021 per chart review next  [facility] follow-up, TCM 7 Nausea, vomiting, constipation: Admitted with above symptoms, on further questioning she has chronic constipation for long time, never formally  evaluated or treated. Since she left the hospital is unable to have a daily BM, on exam abdomen is a slightly tender.  She denies fever chills. Also, CTAbdomen at the hospital showed possible thickening of the proximal descending colon. Plan: Obtain copy of colonoscopy done in [place] reportedly  2021. MiraLAX daily, refer to GI for chronic constipation and abnormal CT colon. Continue with reduced dose of Ultram Hyponatremia: Felt to be due to dehydration.  Check BMP and CBC Colon thickening per CT: See above, refer to GI MSK: -Was taking Ultram 2-3 times daily, self decreased to 1 time daily due to constipation.  I agree. Calf tightness, Feet pain: She  mentioned this again,  given history of back surgeries recommend to talk with Dr. Vertell Limber or Ortho. Numbness around the nose: At the end of the visit she also mentioned this issue, exam shows a symmetric face.  No unilateral symptoms.  We agreed on observation for now. RTC 4 months  This visit occurred during the SARS-CoV-2 public health emergency.  Safety protocols were in place, including screening questions prior to the visit, additional usage of staff PPE, and extensive cleaning of exam room while observing appropriate contact time as indicated for disinfecting solutions.

## 2021-06-21 NOTE — Patient Instructions (Addendum)
MiraLAX 17 g daily mixed with water.  Fiber rich diet (fruits, salads, vegetables)  Agree with decreased tramadol to 1 tablet daily as needed.  Referring you to gastroenterology.  If fever chills, increased abdominal pain, severe constipation: Call the office or go to the ER  GO TO THE LAB : Get the blood work     [place], [place] Come back for CPX in approximately 4 months

## 2021-06-22 LAB — BASIC METABOLIC PANEL
BUN: 9 mg/dL (ref 6–23)
CO2: 25 mEq/L (ref 19–32)
Calcium: 9.4 mg/dL (ref 8.4–10.5)
Chloride: 93 mEq/L — ABNORMAL LOW (ref 96–112)
Creatinine, Ser: 0.8 mg/dL (ref 0.40–1.20)
GFR: 75.91 mL/min (ref >60.00)
Glucose, Bld: 95 mg/dL (ref 70–99)
Potassium: 4.2 mEq/L (ref 3.5–5.1)
Sodium: 126 mEq/L — ABNORMAL LOW (ref 135–145)

## 2021-06-22 LAB — CBC WITH DIFFERENTIAL/PLATELET
Basophils Absolute: 0.1 10*3/uL (ref 0.0–0.1)
Basophils Relative: 1.2 % (ref 0.0–3.0)
Eosinophils Absolute: 0 10*3/uL (ref 0.0–0.7)
Eosinophils Relative: 0.5 % (ref 0.0–5.0)
HCT: 37.7 % (ref 36.0–46.0)
Hemoglobin: 13.4 g/dL (ref 12.0–15.0)
Lymphocytes Relative: 26.1 % (ref 12.0–46.0)
Lymphs Abs: 1.7 10*3/uL (ref 0.7–4.0)
MCHC: 35.5 g/dL (ref 30.0–36.0)
MCV: 85.5 fl (ref 78.0–100.0)
Monocytes Absolute: 0.4 10*3/uL (ref 0.1–1.0)
Monocytes Relative: 6.5 % (ref 3.0–12.0)
Neutro Abs: 4.4 10*3/uL (ref 1.4–7.7)
Neutrophils Relative %: 65.7 % (ref 43.0–77.0)
Platelets: 313 10*3/uL (ref 150.0–400.0)
RBC: 4.41 Mil/uL (ref 3.87–5.11)
RDW: 13.2 % (ref 11.5–15.5)
WBC: 6.6 10*3/uL (ref 4.0–10.5)

## 2021-06-22 NOTE — Assessment & Plan Note (Signed)
Hospital follow-up, TCM 7 Nausea, vomiting, constipation: Admitted with above symptoms, on further questioning she has chronic constipation for long time, never formally evaluated or treated. Since she left the hospital is unable to have a daily BM, on exam abdomen is a slightly tender.  She denies fever chills. Also, CTAbdomen at the hospital showed possible thickening of the proximal descending colon. Plan: Obtain copy of colonoscopy done in [place] reportedly  2021. MiraLAX daily, refer to GI for chronic constipation and abnormal CT colon. Continue with reduced dose of Ultram Hyponatremia: Felt to be due to dehydration.  Check BMP and CBC Colon thickening per CT: See above, refer to GI MSK: -Was taking Ultram 2-3 times daily, self decreased to 1 time daily due to constipation.  I agree. Calf tightness, Feet pain: She  mentioned this again,  given history of back surgeries recommend to talk with Dr. Vertell Limber or Ortho. Numbness around the nose: At the end of the visit she also mentioned this issue, exam shows a symmetric face.  No unilateral symptoms.  We agreed on observation for now. RTC 4 months

## 2021-06-23 NOTE — Addendum Note (Signed)
Addended byDamita Dunnings D on: 06/23/2021 01:09 PM   Modules accepted: Orders

## 2021-06-27 ENCOUNTER — Emergency Department (HOSPITAL_BASED_OUTPATIENT_CLINIC_OR_DEPARTMENT_OTHER)
Admission: EM | Admit: 2021-06-27 | Discharge: 2021-06-27 | Disposition: A | Discharge status: Home / Self Care | Payer: Medicare Other | Attending: Emergency Medicine | Admitting: Emergency Medicine

## 2021-06-27 ENCOUNTER — Emergency Department (HOSPITAL_BASED_OUTPATIENT_CLINIC_OR_DEPARTMENT_OTHER): Payer: Medicare Other

## 2021-06-27 ENCOUNTER — Encounter (HOSPITAL_BASED_OUTPATIENT_CLINIC_OR_DEPARTMENT_OTHER): Payer: Self-pay | Admitting: *Deleted

## 2021-06-27 ENCOUNTER — Other Ambulatory Visit: Payer: Self-pay

## 2021-06-27 DIAGNOSIS — K5900 Constipation, unspecified: Secondary | ICD-10-CM | POA: Diagnosis not present

## 2021-06-27 DIAGNOSIS — E039 Hypothyroidism, unspecified: Secondary | ICD-10-CM | POA: Insufficient documentation

## 2021-06-27 DIAGNOSIS — I10 Essential (primary) hypertension: Secondary | ICD-10-CM | POA: Diagnosis not present

## 2021-06-27 DIAGNOSIS — Z85850 Personal history of malignant neoplasm of thyroid: Secondary | ICD-10-CM | POA: Diagnosis not present

## 2021-06-27 DIAGNOSIS — R1031 Right lower quadrant pain: Secondary | ICD-10-CM | POA: Diagnosis present

## 2021-06-27 DIAGNOSIS — R109 Unspecified abdominal pain: Secondary | ICD-10-CM

## 2021-06-27 DIAGNOSIS — Z79899 Other long term (current) drug therapy: Secondary | ICD-10-CM | POA: Insufficient documentation

## 2021-06-27 DIAGNOSIS — Z20822 Contact with and (suspected) exposure to covid-19: Secondary | ICD-10-CM | POA: Insufficient documentation

## 2021-06-27 LAB — SARS CORONAVIRUS 2 (TAT 6-24 HRS): SARS Coronavirus 2: NEGATIVE

## 2021-06-27 LAB — CBC WITH DIFFERENTIAL/PLATELET
Abs Immature Granulocytes: 0.01 10*3/uL (ref 0.00–0.07)
Basophils Absolute: 0.1 10*3/uL (ref 0.0–0.1)
Basophils Relative: 4 %
Eosinophils Absolute: 0.1 10*3/uL (ref 0.0–0.5)
Eosinophils Relative: 1 %
HCT: 37.4 % (ref 36.0–46.0)
Hemoglobin: 13.1 g/dL (ref 12.0–15.0)
Immature Granulocytes: 0 %
Lymphocytes Relative: 37 %
Lymphs Abs: 1.4 10*3/uL (ref 0.7–4.0)
MCH: 29.8 pg (ref 26.0–34.0)
MCHC: 35 g/dL (ref 30.0–36.0)
MCV: 85 fL (ref 80.0–100.0)
Monocytes Absolute: 0.3 10*3/uL (ref 0.1–1.0)
Monocytes Relative: 8 %
Neutro Abs: 1.9 10*3/uL (ref 1.7–7.7)
Neutrophils Relative %: 50 %
Platelets: 290 10*3/uL (ref 150–400)
RBC: 4.4 MIL/uL (ref 3.87–5.11)
RDW: 12.4 % (ref 11.5–15.5)
WBC: 3.8 10*3/uL — ABNORMAL LOW (ref 4.0–10.5)
nRBC: 0 % (ref 0.0–0.2)

## 2021-06-27 LAB — URINALYSIS, ROUTINE W REFLEX MICROSCOPIC
Bilirubin Urine: NEGATIVE
Glucose, UA: NEGATIVE mg/dL
Hgb urine dipstick: NEGATIVE
Ketones, ur: NEGATIVE mg/dL
Leukocytes,Ua: NEGATIVE
Nitrite: NEGATIVE
Protein, ur: NEGATIVE mg/dL
Specific Gravity, Urine: 1.01 (ref 1.005–1.030)
pH: 7 (ref 5.0–8.0)

## 2021-06-27 LAB — LIPASE, BLOOD: Lipase: 40 U/L (ref 11–51)

## 2021-06-27 LAB — COMPREHENSIVE METABOLIC PANEL
ALT: 16 U/L (ref 0–44)
AST: 28 U/L (ref 15–41)
Albumin: 4 g/dL (ref 3.5–5.0)
Alkaline Phosphatase: 73 U/L (ref 38–126)
Anion gap: 7 (ref 5–15)
BUN: 7 mg/dL — ABNORMAL LOW (ref 8–23)
CO2: 24 mmol/L (ref 22–32)
Calcium: 8.6 mg/dL — ABNORMAL LOW (ref 8.9–10.3)
Chloride: 96 mmol/L — ABNORMAL LOW (ref 98–111)
Creatinine, Ser: 0.78 mg/dL (ref 0.44–1.00)
GFR, Estimated: >60 mL/min (ref >60)
Glucose, Bld: 119 mg/dL — ABNORMAL HIGH (ref 70–99)
Potassium: 4.2 mmol/L (ref 3.5–5.1)
Sodium: 127 mmol/L — ABNORMAL LOW (ref 135–145)
Total Bilirubin: 0.5 mg/dL (ref 0.3–1.2)
Total Protein: 6.5 g/dL (ref 6.5–8.1)

## 2021-06-27 MED ORDER — ONDANSETRON HCL 4 MG/2ML IJ SOLN
4.0000 mg | Freq: Once | INTRAMUSCULAR | Status: AC
  Administered 2021-06-27: 4 mg via INTRAVENOUS
  Filled 2021-06-27: qty 2

## 2021-06-27 MED ORDER — CYCLOBENZAPRINE HCL 10 MG PO TABS
10.0000 mg | ORAL_TABLET | Freq: Once | ORAL | Status: AC
  Administered 2021-06-27: 10 mg via ORAL
  Filled 2021-06-27: qty 1

## 2021-06-27 MED ORDER — IOHEXOL 300 MG/ML  SOLN
75.0000 mL | Freq: Once | INTRAMUSCULAR | Status: AC | PRN
  Administered 2021-06-27: 75 mL via INTRAVENOUS

## 2021-06-27 MED ORDER — DICYCLOMINE HCL 20 MG PO TABS: 20.0000 mg | ORAL_TABLET | Freq: Two times a day (BID) | ORAL | 0 refills | Status: DC | PRN

## 2021-06-27 MED ORDER — SODIUM CHLORIDE 0.9 % IV BOLUS
1000.0000 mL | Freq: Once | INTRAVENOUS | Status: AC
  Administered 2021-06-27: 1000 mL via INTRAVENOUS

## 2021-06-27 MED ORDER — DICYCLOMINE HCL 10 MG/ML IM SOLN
20.0000 mg | Freq: Once | INTRAMUSCULAR | Status: AC
  Administered 2021-06-27: 20 mg via INTRAMUSCULAR
  Filled 2021-06-27: qty 2

## 2021-06-27 NOTE — ED Provider Notes (Signed)
[facility] EMERGENCY DEPARTMENT Provider Note   CSN: [id number] Arrival date & time: 06/27/21  6962     History Chief Complaint  Patient presents with   Abdominal Pain    Alyssa [name] is a 70 y.o. female.  HPI   Constipation over the last month, small hard stool occasionally, then was worse, did have BM yesterday was softer, is taking stool softener once a day, miralax, milk of mag once daily, 1 cap of miralax daily.    Admitted to hte hospital 7/8 with hyponatremia, colonic thickening on CT trying to get an appointment, gastritis  Today primarily here for the tendernesss of RLQ, distended abdomen Pain approximately for one week, distended abd constant, pain RLQ previous night very intense, somewhat better but still tenderness Nausea but no vomiting No fevers, no chills No urinary symptoms.  On clindamycin for dental infection implant  Past Medical History:  Diagnosis Date   Anxiety and depression    Arthritis    Cervicogenic headache    Claustrophobia    Glaucoma    Hypertension    Lumbar radiculopathy    Postsurgical hypothyroidism    Spinal stenosis    Thyroid cancer [facility])    s/p thyroidectomy    Patient Active Problem List   Diagnosis Date Noted   Gastritis 06/17/2021   Constipation 06/17/2021   Nausea and vomiting 06/17/2021   Hyponatremia 06/16/2021   PCP NOTES >>>>>>>>>>> 06/06/2021   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   Spinal stenosis, lumbar region, 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   Status post left foot surgery 05/15/2021   Anxiety and depression 04/26/2021   Cervicogenic headache 04/26/2021   Claustrophobia 04/26/2021   History of lumbar laminectomy 10/31/2020   Other specified glaucoma 10/31/2020   Left lumbar radiculopathy 06/21/2020   Malignant neoplasm of thyroid gland ([place]) 11/03/2019   Postoperative hypothyroidism 11/03/2019   Essential hypertension 12/11/2017    Past Surgical History:  Procedure  Laterality Date   FOOT SURGERY     LAMINOTOMY  04/03/2021   LEFT L3/4 LAMINOTOMY BILATERAL L3/4 MEDIAL FACETECTOMY   LUMBAR LAMINECTOMY  03/03/2018   THYROIDECTOMY  12/2004     OB History   No obstetric history on file.     Family History  Problem Relation Age of Onset   Hypertension Sister    Diabetes Paternal Grandmother    Colon cancer Neg Hx    Breast cancer Neg Hx     Social History   Tobacco Use   Smoking status: Never   Smokeless tobacco: Never  Substance Use Topics   Alcohol use: Yes   Drug use: Never    Home Medications Prior to Admission medications   Medication Sig Start Date End Date Taking? Authorizing Provider  dicyclomine (BENTYL) 20 MG tablet Take 1 tablet (20 mg total) by mouth 2 (two) times daily as needed for spasms (abdominal pain). 06/27/21  Yes Gareth Morgan, MD  acyclovir (ZOVIRAX) 400 MG tablet acyclovir 400 mg tablet  PRN    [provider]  diclofenac Sodium (VOLTAREN) 1 % GEL Apply 2 g topically 4 (four) times daily. 04/25/21   Tacy Learn, PA-C  escitalopram (LEXAPRO) 10 MG tablet  03/27/21   [provider]  gabapentin (NEURONTIN) 300 MG capsule Take 300 mg by mouth 2 (two) times daily. 03/27/21   [provider]  levothyroxine (SYNTHROID) 112 MCG tablet Take 1 tablet by mouth 6 days weekly 06/07/21   Colon Branch, MD  omeprazole (PRILOSEC OTC) 20  MG tablet Take 1 tablet (20 mg total) by mouth daily. 06/17/21 07/17/21  Patrecia Pour, MD  timolol (TIMOPTIC) 0.5 % ophthalmic solution timolol maleate 0.5 % eye drops  once daily 08/15/17   [provider]  traMADol (ULTRAM) 50 MG tablet Take by mouth. Patient not taking: Reported on 06/21/2021 12/13/20   [provider]  TRAVOPROST OP Apply to eye.    [provider]  zolpidem (AMBIEN CR) 12.5 MG CR tablet Take by mouth as needed.    [provider]    Allergies    Amoxicillin  Review of Systems   Review of Systems  Constitutional:   Positive for appetite change and fatigue. Negative for fever.  Eyes:  Negative for visual disturbance.  Respiratory:  Negative for cough and shortness of breath.   Cardiovascular:  Negative for chest pain.  Gastrointestinal:  Positive for abdominal pain, constipation and nausea. Negative for vomiting.  Genitourinary:  Negative for difficulty urinating and dysuria.  Musculoskeletal:  Positive for back pain. Negative for neck pain.  Skin:  Negative for rash.  Neurological:  Negative for syncope and headaches.   Physical Exam Updated Vital Signs BP (!) 150/70   Pulse 62   Temp 98.2 F (36.8 C) (Oral)   Resp 18   Ht 5\' 8"  (1.727 m)   Wt 74.8 kg   SpO2 100%   BMI 25.09 kg/m   Physical Exam Vitals and nursing note reviewed.  Constitutional:      General: She is not in acute distress.    Appearance: She is well-developed. She is not diaphoretic.  HENT:     Head: Normocephalic and atraumatic.  Eyes:     Conjunctiva/sclera: Conjunctivae normal.  Cardiovascular:     Rate and Rhythm: Normal rate and regular rhythm.     Heart sounds: Normal heart sounds. No murmur heard.   No friction rub. No gallop.  Pulmonary:     Effort: Pulmonary effort is normal. No respiratory distress.     Breath sounds: Normal breath sounds. No wheezing or rales.  Abdominal:     General: There is no distension.     Palpations: Abdomen is soft.     Tenderness: There is no abdominal tenderness. There is no guarding.  Musculoskeletal:        General: No tenderness.     Cervical back: Normal range of motion.  Skin:    General: Skin is warm and dry.     Findings: No erythema or rash.  Neurological:     Mental Status: She is alert and oriented to person, place, and time.    ED Results / Procedures / Treatments   Labs (all labs ordered are listed, but only abnormal results are displayed) Labs Reviewed  CBC WITH DIFFERENTIAL/PLATELET - Abnormal; Notable for the following components:      Result Value    WBC 3.8 (*)    All other components within normal limits  COMPREHENSIVE METABOLIC PANEL - Abnormal; Notable for the following components:   Sodium 127 (*)    Chloride 96 (*)    Glucose, Bld 119 (*)    BUN 7 (*)    Calcium 8.6 (*)    All other components within normal limits  SARS CORONAVIRUS 2 (TAT 6-24 HRS)  URINALYSIS, ROUTINE W REFLEX MICROSCOPIC  LIPASE, BLOOD    EKG None  Radiology CT ABDOMEN PELVIS W CONTRAST  Result Date: 06/27/2021 CLINICAL DATA:  Right lower quadrant pain suspected appendicitis, constipation X 1 month  EXAM: CT ABDOMEN AND PELVIS WITH CONTRAST TECHNIQUE: Multidetector CT imaging of the abdomen and pelvis was performed using the standard protocol following bolus administration of intravenous contrast. CONTRAST:  11mL OMNIPAQUE IOHEXOL 300 MG/ML  SOLN COMPARISON:  06/16/2021 FINDINGS: Lower chest: No pleural or pericardial effusion. Hepatobiliary: Stable probable hepatic cysts. No calcified stones in the nondistended gallbladder. Pancreas: Unremarkable. No pancreatic ductal dilatation or surrounding inflammatory changes. Spleen: Normal in size without focal abnormality. Small accessory splenule Adrenals/Urinary Tract: Adrenal glands unremarkable. 3 mm calcification, lower pole left kidney. No hydronephrosis. Stomach/Bowel: Stomach nondistended, unremarkable. Small bowel decompressed. Appendix not identified. Moderate colonic fecal material throughout. No focal inflammatory changes. Vascular/Lymphatic: Mild calcified aortic plaque without aneurysm, dissection, or stenosis. No abdominal or pelvic adenopathy. Reproductive: Uterus and bilateral adnexa are unremarkable. Other: No ascites.  No free air. Musculoskeletal: Post laminotomy L2-3. no fracture or worrisome bone lesion. IMPRESSION: 1. No acute findings. 2. Moderate colonic fecal material without dilatation 3. Left nephrolithiasis without hydronephrosis 4. Aortic Atherosclerosis (ICD10-170.0). Electronically Signed   By:  Lucrezia Europe M.D.   On: 06/27/2021 09:56    Procedures Procedures   Medications Ordered in ED Medications  dicyclomine (BENTYL) injection 20 mg (20 mg Intramuscular Given 06/27/21 0758)  ondansetron (ZOFRAN) injection 4 mg (4 mg Intravenous Given 06/27/21 0758)  sodium chloride 0.9 % bolus 1,000 mL (0 mLs Intravenous Stopped 06/27/21 1007)  iohexol (OMNIPAQUE) 300 MG/ML solution 75 mL (75 mLs Intravenous Contrast Given 06/27/21 0915)  cyclobenzaprine (FLEXERIL) tablet 10 mg (10 mg Oral Given 06/27/21 0350)    ED Course  I have reviewed the triage vital signs and the nursing notes.  Pertinent labs & imaging results that were available during my care of the patient were reviewed by me and considered in my medical decision making (see chart for details).    MDM Rules/Calculators/A&P                          70 year old female with a history of anxiety, depression, hypertension, lumbar radiculopathy, hypothyroidism, thyroid cancer status post thyroidectomy, recent admission July 8 with hyponatremia down to 122, colonic thickening on CT, gastritis with decreased p.o. intake, who presents with concern for continuing constipation and new abdominal pain for 1 week.  DDx includes appendicitis, pancreatitis, cholecystitis, pyelonephritis, nephrolithiasis, diverticulitis, AAA, constipation, SBO.  Labs obtained show no evidence of pancreatitis or hepatitis.  Sodium is 127, improved from her prior admission.  She was given 1 L of normal saline.  CT abdomen pelvis obtained given right-sided tenderness shows no evidence of appendicitis or other acute intra-abdominal pathology.  Discussed recommendation for gastroenterology follow-up as well as follow-up with her primary care doctor for her hyponatremia.  Discussed options for more aggressive constipation regimen. Patient discharged in stable condition with understanding of reasons to return.     Final Clinical Impression(s) / ED Diagnoses Final diagnoses:   Abdominal pain, unspecified abdominal location  Constipation, unspecified constipation type    Rx / DC Orders ED Discharge Orders          Ordered    dicyclomine (BENTYL) 20 MG tablet  2 times daily PRN        06/27/21 1014             Gareth Morgan, MD 06/28/21 [phone]

## 2021-06-27 NOTE — Discharge Instructions (Addendum)
It was a pleasure caring for you today! Continue your current bowel regimen, may increase stool softener to twice daily and increase miralax for clean out -- 4 caps in 1 32oz bottle day 1, 3 caps day 2, 2 caps day 3 etc.

## 2021-06-27 NOTE — ED Triage Notes (Signed)
Presents with abd pain, having constipation for the past month, also c/o tenderness at rt side of abd. Having some nausea.

## 2021-06-28 ENCOUNTER — Other Ambulatory Visit (INDEPENDENT_AMBULATORY_CARE_PROVIDER_SITE_OTHER): Payer: Medicare Other

## 2021-06-28 ENCOUNTER — Encounter: Payer: Self-pay | Admitting: Internal Medicine

## 2021-06-28 DIAGNOSIS — E222 Syndrome of inappropriate secretion of antidiuretic hormone: Secondary | ICD-10-CM

## 2021-06-28 DIAGNOSIS — E871 Hypo-osmolality and hyponatremia: Secondary | ICD-10-CM | POA: Diagnosis not present

## 2021-06-28 LAB — BASIC METABOLIC PANEL
BUN: 8 mg/dL (ref 6–23)
CO2: 28 mEq/L (ref 19–32)
Calcium: 9.1 mg/dL (ref 8.4–10.5)
Chloride: 96 mEq/L (ref 96–112)
Creatinine, Ser: 0.76 mg/dL (ref 0.40–1.20)
GFR: 80.72 mL/min (ref >60.00)
Glucose, Bld: 98 mg/dL (ref 70–99)
Potassium: 4.5 mEq/L (ref 3.5–5.1)
Sodium: 129 mEq/L — ABNORMAL LOW (ref 135–145)

## 2021-06-30 LAB — OSMOLALITY: Osmolality: 274 mOsm/kg — ABNORMAL LOW (ref 278–305)

## 2021-06-30 LAB — SODIUM, URINE, RANDOM: Sodium, Ur: 63 mmol/L (ref 28–272)

## 2021-06-30 NOTE — Addendum Note (Signed)
Addended byDamita Dunnings D on: 06/30/2021 04:47 PM   Modules accepted: Orders

## 2021-07-04 ENCOUNTER — Telehealth: Payer: Self-pay | Admitting: Internal Medicine

## 2021-07-04 NOTE — Telephone Encounter (Signed)
Hi Dr. Henrene Pastor this pt would like to do a transfer of care from [place] to [facility] due to pt just recently moving here from [place] to [facility] [place]. According to the rcds received on 07/03/21 PM you are the DOD. We will send rcds up for review. Please advise on scheduling. Thanks.

## 2021-07-06 NOTE — Telephone Encounter (Signed)
RECORDS REVIEWED  This patient has a  PCP and would like to establish GI care.  She lives in [place].  Please schedule with Dr. Lyndel Safe or Dr. Bryan Lemma in our [facility] office.  Please make sure that the records (currently on my office) are sent there for their availability.

## 2021-07-10 NOTE — Telephone Encounter (Signed)
Hi Dr. Bryan Lemma,  Please read below for transfer of care request and advise on scheduling.   Thank you

## 2021-07-11 NOTE — Telephone Encounter (Signed)
Called patient to schedule left voicemail. 

## 2021-07-14 ENCOUNTER — Encounter: Payer: Self-pay | Admitting: Gastroenterology

## 2021-07-19 ENCOUNTER — Encounter: Payer: Self-pay | Admitting: Gastroenterology

## 2021-07-28 LAB — COMPREHENSIVE METABOLIC PANEL
Albumin: 4.5 (ref 3.5–5.0)
Calcium: 9.2 (ref 8.7–10.7)
GFR calc non Af Amer: 94

## 2021-07-28 LAB — BASIC METABOLIC PANEL
BUN: 15 (ref 4–21)
CO2: 25 — AB (ref 13–22)
Chloride: 98 — AB (ref 99–108)
Creatinine: 0.7 (ref 0.5–1.1)
Glucose: 87
Potassium: 4.5 (ref 3.4–5.3)
Sodium: 132 — AB (ref 137–147)

## 2021-07-28 LAB — TSH: TSH: 3.87 (ref 0.41–5.90)

## 2021-07-28 LAB — HEPATIC FUNCTION PANEL
ALT: 16 (ref 7–35)
AST: 19 (ref 13–35)
Alkaline Phosphatase: 95 (ref 25–125)
Bilirubin, Total: 0.6

## 2021-08-02 ENCOUNTER — Encounter: Payer: Self-pay | Admitting: Family Medicine

## 2021-08-02 ENCOUNTER — Other Ambulatory Visit (HOSPITAL_COMMUNITY)
Admission: RE | Admit: 2021-08-02 | Discharge: 2021-08-02 | Disposition: A | Discharge status: Home / Self Care | Payer: Medicare Other | Source: Ambulatory Visit | Attending: Family Medicine | Admitting: Family Medicine

## 2021-08-02 ENCOUNTER — Other Ambulatory Visit: Payer: Self-pay

## 2021-08-02 ENCOUNTER — Ambulatory Visit (INDEPENDENT_AMBULATORY_CARE_PROVIDER_SITE_OTHER): Payer: Medicare Other | Admitting: Family Medicine

## 2021-08-02 VITALS — BP 126/68 | HR 62 | Ht 67.0 in | Wt 164.0 lb

## 2021-08-02 DIAGNOSIS — N898 Other specified noninflammatory disorders of vagina: Secondary | ICD-10-CM

## 2021-08-02 DIAGNOSIS — Z1231 Encounter for screening mammogram for malignant neoplasm of breast: Secondary | ICD-10-CM | POA: Diagnosis not present

## 2021-08-02 MED ORDER — ESTRADIOL 10 MCG VA TABS: 10.0000 ug | ORAL_TABLET | VAGINAL | 12 refills | Status: DC

## 2021-08-02 NOTE — Progress Notes (Signed)
Subjective:     Alyssa [name] is a 70 y.o. female and is here for a comprehensive physical exam. The patient reports  vaginal irritation.  Recently on abx for a gum issue following a tooth implant. Having some vaginal irritation and vaginal discharge. No itching. Has constipation issues.   The following portions of the patient's history were reviewed and updated as appropriate: allergies, current medications, past family history, past medical history, past social history, past surgical history, and problem list.  Review of Systems Pertinent items noted in HPI and remainder of comprehensive ROS otherwise negative.   Objective:    BP 126/68   Pulse 62   Ht '5\' 7"'$  (1.702 m)   Wt 164 lb (74.4 kg)   BMI 25.69 kg/m  General appearance: alert, cooperative, and appears stated age Head: Normocephalic, without obvious abnormality, atraumatic Neck: no adenopathy, supple, symmetrical, trachea midline, and thyroid not enlarged, symmetric, no tenderness/mass/nodules Lungs: clear to auscultation bilaterally Breasts: normal appearance, no masses or tenderness Heart: regular rate and rhythm, S1, S2 normal, no murmur, click, rub or gallop Abdomen: soft, non-tender; bowel sounds normal; no masses,  no organomegaly Pelvic: cervix normal in appearance, external genitalia normal, no adnexal masses or tenderness, no cervical motion tenderness, uterus normal size, shape, and consistency, and vaginal atrophy, yellow discharge noted Extremities: extremities normal, atraumatic, no cyanosis or edema Pulses: 2+ and symmetric Skin: Skin color, texture, turgor normal. No rashes or lesions Lymph nodes: Cervical, supraclavicular, and axillary nodes normal. Neurologic: Grossly normal    Assessment:    GYN female exam.      Plan:     Problem List Items Addressed This Visit       Unprioritized   Vaginal dryness    Has been on vagifem and wants refill--also, asks about compounded testosterone cream for low  sex drive--to get me the dosing for possible resumption      Relevant Medications   Estradiol 10 MCG TABS vaginal tablet   Other Visit Diagnoses     Vaginal irritation    -  Primary   Check wet prep and treat accordingly   Relevant Orders   Cervicovaginal ancillary only( [place])   Encounter for screening mammogram for malignant neoplasm of breast       Mammogram due 12/2021--records in Care Everywhere   Relevant Orders   MM 3D SCREEN BREAST BILATERAL      Return in 1 year (on 08/02/2022).  See After Visit Summary for Counseling Recommendations

## 2021-08-02 NOTE — Progress Notes (Signed)
Patient states she was recently on antibiotics and has had some vaginal discharge and pain. Kathrene Alu RN

## 2021-08-02 NOTE — Assessment & Plan Note (Signed)
Has been on vagifem and wants refill--also, asks about compounded testosterone cream for low sex drive--to get me the dosing for possible resumption

## 2021-08-03 LAB — CERVICOVAGINAL ANCILLARY ONLY
Bacterial Vaginitis (gardnerella): NEGATIVE
Candida Glabrata: NEGATIVE
Candida Vaginitis: NEGATIVE
Comment: NEGATIVE
Comment: NEGATIVE
Comment: NEGATIVE

## 2021-08-08 ENCOUNTER — Other Ambulatory Visit: Payer: Self-pay

## 2021-08-08 ENCOUNTER — Ambulatory Visit (INDEPENDENT_AMBULATORY_CARE_PROVIDER_SITE_OTHER): Payer: Medicare Other | Admitting: Gastroenterology

## 2021-08-08 ENCOUNTER — Encounter: Payer: Self-pay | Admitting: Gastroenterology

## 2021-08-08 VITALS — BP 122/78 | HR 65 | Ht 67.0 in | Wt 164.5 lb

## 2021-08-08 DIAGNOSIS — R933 Abnormal findings on diagnostic imaging of other parts of digestive tract: Secondary | ICD-10-CM

## 2021-08-08 DIAGNOSIS — K5900 Constipation, unspecified: Secondary | ICD-10-CM | POA: Diagnosis not present

## 2021-08-08 DIAGNOSIS — Z86010 Personal history of colonic polyps: Secondary | ICD-10-CM

## 2021-08-08 NOTE — Progress Notes (Signed)
Chief Complaint: Constipation   Referring Provider:     Colon Branch, MD   HPI:     TELANA ZANELLA is a 70 y.o. female with a history of HTN, glaucoma, spinal stenosis s/p L3/4 laminectomy, anxiety, depression, thyroid cancer  s/p postoperative thyroidectomy with hypothyroidism, referred to the Gastroenterology Clinic for evaluation of constipation.  Admitted to the hospital overnight on 06/16/2021 with nausea, vomiting, constipation, hyponatremia.  Admission evaluation notable for the following: - NA 122, chloride 91, otherwise normal CMP - Normal CBC, TSH - CT abdomen/pelvis (06/16/2021): Small hepatic cysts, gastritis, constipation with descending colon thickening - Treated with IVF, D5W with resolution of symptoms and improved electrolytes with discharge home  Was seen in the ER again on 06/27/2021 with continued constipation and RLQ tenderness.  Had been taking stool softener, MiraLAX, milk of mag. - NA 127, otherwise normal CMP - WBC 3.8, otherwise normal CBC - Normal lipase, COVID-negative - CT abdomen/pelvis: Moderate colonic fecal material without dilation/obstruction.  No wall thickening.  Probable hepatic cyst, otherwise normal liver, GB, pancreas. 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  Reviewed outpatient labs from 6/22: - Normal CBC, CMP - TSH 0.08.  Decreased levothyroxine  Reviewed labs from 03/2021 from [facility]: - Normal CBC - Na 134, otherwise normal CMP - Normal vitamin D - Normal TSH, T4  - Colonoscopy (10/08/2019, [facility]): Moderately excessive looping with moderate colonic spasm, 4 mm ascending polyp (SSP), 4 diminutive polyps in descending colon (HP's), 2 diminutive rectosigmoid polyps (HP's), internal hemorrhoids  Today, she reports she has a prior history of  constipation, but typically has been able to control with OTC medications and diet in the past.  More progressive constipation starting early July with hospital admission and then ER evaluation as outlined above.  Currently takes Metamucil, OTC stool softener, castor oil, senna herbal tea.  Had BM earlier today, but previous BM was about 8 days ago.    Last colonoscopy was 09/2019 in [place] n/f polyps and looping as above.  Has had prior colonoscopies with incomplete prep despite following instructions well. She brings a prior GI-MAP DNA Stool analysis result with her from 12/2020, but otherwise no other GI records to review in EMR or with patient.   Interestingly, has been on Abx for dental infections/procedures for the last month.    Past Medical History:  Diagnosis Date   Anxiety and depression    Arthritis    Cervicogenic headache    Claustrophobia    Glaucoma    Hypertension    Lumbar radiculopathy    Postsurgical hypothyroidism    Spinal stenosis    Thyroid cancer [facility])    s/p thyroidectomy     Past Surgical History:  Procedure Laterality Date   FOOT SURGERY     LAMINOTOMY  04/03/2021   LEFT L3/4 LAMINOTOMY BILATERAL L3/4 MEDIAL FACETECTOMY   LUMBAR LAMINECTOMY  03/03/2018   THYROIDECTOMY  12/2004   Family History  Problem Relation Age of Onset   Hypertension Sister    Thyroid cancer Brother    Skin cancer Brother    Diabetes Paternal Grandmother    Colon cancer Neg Hx    Breast cancer Neg Hx    Pancreatic cancer Neg Hx    Stomach  cancer Neg Hx    Liver cancer Neg Hx    Esophageal cancer Neg Hx    Social History   Tobacco Use   Smoking status: Never   Smokeless tobacco: Never  Vaping Use   Vaping Use: Never used  Substance Use Topics   Alcohol use: Yes    Comment: Rare   Drug use: Never   Current Outpatient Medications  Medication Sig Dispense Refill   acyclovir (ZOVIRAX) 400 MG tablet acyclovir 400 mg tablet  PRN     clindamycin (CLEOCIN) 150 MG  capsule Take 150 mg by mouth 3 (three) times daily.     cyclobenzaprine (FLEXERIL) 5 MG tablet Take 5 mg by mouth 2 (two) times daily.     Estradiol 10 MCG TABS vaginal tablet Place 1 tablet (10 mcg total) vaginally 3 (three) times a week. 30 tablet 12   gabapentin (NEURONTIN) 300 MG capsule Take 300 mg by mouth 2 (two) times daily.     levothyroxine (SYNTHROID) 112 MCG tablet Take 1 tablet by mouth 6 days weekly     methylPREDNISolone (MEDROL DOSEPAK) 4 MG TBPK tablet Take by mouth. Take as directed     timolol (TIMOPTIC) 0.5 % ophthalmic solution timolol maleate 0.5 % eye drops  once daily     traMADol (ULTRAM) 50 MG tablet Take by mouth.     TRAVOPROST OP Apply to eye.     zolpidem (AMBIEN CR) 12.5 MG CR tablet Take by mouth as needed.     No current facility-administered medications for this visit.   Allergies  Allergen Reactions   Amoxicillin Hives     Review of Systems: All systems reviewed and negative except where noted in HPI.     Physical Exam:    Wt Readings from Last 3 Encounters:  08/08/21 164 lb 8 oz (74.6 kg)  08/02/21 164 lb (74.4 kg)  06/27/21 165 lb (74.8 kg)    BP 122/78   Pulse 65   Ht '5\' 7"'$  (1.702 m)   Wt 164 lb 8 oz (74.6 kg)   SpO2 98%   BMI 25.76 kg/m  Constitutional:  Pleasant, in no acute distress. Psychiatric: Normal mood and affect. Behavior is normal. Cardiovascular: Normal rate, regular rhythm. No edema Pulmonary/chest: Effort normal and breath sounds normal. No wheezing, rales or rhonchi. Abdominal: Soft, nondistended, nontender. Bowel sounds active throughout. There are no masses palpable. No hepatomegaly. Neurological: Alert and oriented to person place and time. Skin: Skin is warm and dry. No rashes noted.   ASSESSMENT AND PLAN;   1) Constipation Clinical presentation c/w longstanding history of constipation with associated excessive colon looping on prior colonoscopy.  More recent exacerbation requiring hospital admission (more so  for the hyponatremia) and subsequent ER evaluation.  Discussed broad DDx and plan to evaluate/treat as follows:  - Has history of postoperative hypothyroidism, but recent normal TSH - Sitz marker study - Symptoms do not sound consistent with pelvic floor dyssynergia, but depending on Sitz marker study, could consider ARM as appropriate - Did have colonoscopy in 09/2019.  While this was n/f excessive looping, no obstruction, stricture noted.  Can hold off on repeat colonoscopy at this juncture - Holding laxatives, senna, etc. during Sitz marker study, but then plan to resume - Likely plan for MiraLAX bowel prep for clear out, then restart MiraLAX to cap/day and titrate to effect after Sitz study - Maintain adequate hydration - Continue healthy eating habits with high-fiber diet  2) History of colon polyps - Colonoscopy  in 09/2019 with 1 subcentimeter SSP, and several subcentimeter HP's.  Based on that finding, repeat colonoscopy in 2025 for ongoing polyp surveillance.  3) Abnormal imaging study - Initial CT on hospital admission with descending colon wall thickening.  This was not seen on repeat CT on 7/19.  Suspect motion artifact - Constipation noted on each of the CTs.  Addressing as above    Lavena Bullion, DO, FACG  08/08/2021, 2:37 PM   Colon Branch, MD

## 2021-08-08 NOTE — Patient Instructions (Addendum)
If you are age 68 or older, your body mass index should be between 23-30. Your Body mass index is 25.76 kg/m. If this is out of the aforementioned range listed, please consider follow up with your Primary Care Provider.  If you are age 52 or younger, your body mass index should be between 19-25. Your Body mass index is 25.76 kg/m. If this is out of the aformentioned range listed, please consider follow up with your Primary Care Provider.   __________________________________________________________  The [facility] providers would like to encourage you to use [facility] to communicate with providers for non-urgent requests or questions.  Due to long hold times on the telephone, sending your provider a message by [facility] may be a faster and more efficient way to get a response.  Please allow 48 business hours for a response.  Please remember that this is for non-urgent requests.   In 5 days from taking sitzmakers please return to the [facility] [address] on the first floor to get x-ray.   It was a pleasure to see you today!  Vito Cirigliano, D.O.

## 2021-08-11 ENCOUNTER — Encounter: Payer: Self-pay | Admitting: Internal Medicine

## 2021-08-15 ENCOUNTER — Other Ambulatory Visit: Payer: Self-pay

## 2021-08-15 ENCOUNTER — Ambulatory Visit (HOSPITAL_BASED_OUTPATIENT_CLINIC_OR_DEPARTMENT_OTHER)
Admission: RE | Admit: 2021-08-15 | Discharge: 2021-08-15 | Disposition: A | Discharge status: Home / Self Care | Payer: Medicare Other | Source: Ambulatory Visit | Attending: Gastroenterology | Admitting: Gastroenterology

## 2021-08-15 DIAGNOSIS — K5900 Constipation, unspecified: Secondary | ICD-10-CM | POA: Diagnosis not present

## 2021-08-15 DIAGNOSIS — Z86010 Personal history of colonic polyps: Secondary | ICD-10-CM | POA: Diagnosis present

## 2021-08-22 ENCOUNTER — Ambulatory Visit: Payer: Medicare Other | Admitting: Internal Medicine

## 2021-08-25 ENCOUNTER — Other Ambulatory Visit: Payer: Self-pay

## 2021-08-25 ENCOUNTER — Encounter: Payer: Self-pay | Admitting: Physical Therapy

## 2021-08-25 ENCOUNTER — Ambulatory Visit (INDEPENDENT_AMBULATORY_CARE_PROVIDER_SITE_OTHER): Payer: Medicare Other | Admitting: Physical Therapy

## 2021-08-25 DIAGNOSIS — R29898 Other symptoms and signs involving the musculoskeletal system: Secondary | ICD-10-CM | POA: Diagnosis not present

## 2021-08-25 DIAGNOSIS — M6281 Muscle weakness (generalized): Secondary | ICD-10-CM

## 2021-08-25 NOTE — Patient Instructions (Signed)
Access Code: [email or web address] URL: [email or web address] Date: 08/25/2021 Prepared by: Isabelle Course  Exercises Bird Dog - 1 x daily - 7 x weekly - 2 sets - 10 reps Supine Bridge with Mini Swiss Ball Between Knees - 1 x daily - 7 x weekly - 2 sets - 10 reps Bridge with Hip Abduction and Resistance - 1 x daily - 7 x weekly - 2 sets - 10 reps Seated Table Piriformis Stretch - 1 x daily - 7 x weekly - 3 sets - 1 reps - 20-30 sec hold

## 2021-08-25 NOTE — Therapy (Signed)
[place], [place], [address] Phone: [phone]   Fax:  [phone]  Physical Therapy Evaluation  Patient Details  Name: Alyssa [name] MRN: [id number] Date of Birth: 1951-07-19 Referring Provider (PT): Dawley, Pieter Partridge   Encounter Date: 08/25/2021   PT End of Session - 08/25/21 1110     Visit Number 1    Number of Visits 12    Date for PT Re-Evaluation 10/06/21    Authorization - Visit Number 1    Progress Note Due on Visit 10    PT Start Time [id number]    PT Stop Time 1100    PT Time Calculation (min) 45 min    Activity Tolerance Patient tolerated treatment well    Behavior During Therapy [facility] for tasks assessed/performed             Past Medical History:  Diagnosis Date   Anxiety and depression    Arthritis    Cervicogenic headache    Claustrophobia    Glaucoma    Hypertension    Lumbar radiculopathy    Postsurgical hypothyroidism    Spinal stenosis    Thyroid cancer [facility])    s/p thyroidectomy    Past Surgical History:  Procedure Laterality Date   FOOT SURGERY     LAMINOTOMY  04/03/2021   LEFT L3/4 LAMINOTOMY BILATERAL L3/4 MEDIAL FACETECTOMY   LUMBAR LAMINECTOMY  03/03/2018   THYROIDECTOMY  12/2004    There were no vitals filed for this visit.    Subjective Assessment - 08/25/21 1023     Subjective Pt states she has had back pain for about a year, she had a laminectomyL3/L4 03/2021. She is having pain in mid back, glutes that goes down bilat LEs causing calf pain. Pt has been doing stretches for calves that were recommended by podiatrist but has been hesistant to perform back exercises. Pain in glutes increases with prolonged sitting, improves with walking.    Pertinent History L4/L5 laminectomy 2019    Limitations Sitting    How long can you sit comfortably? 1 hour    Diagnostic tests x ray shows multilevel degenerative changes    Patient Stated Goals decrease pain and  return to bike riding    Currently in Pain? Yes    Pain Score 6     Pain Location Buttocks   glutes and calves   Pain Orientation Left;Right    Pain Descriptors / Indicators Aching;Dull    Pain Type Chronic pain    Pain Onset More than a month ago    Pain Frequency Constant    Aggravating Factors  prolonged sitting    Pain Relieving Factors walking                [facility] PT Assessment - 08/25/21 0001       Assessment   Medical Diagnosis spondylosis sacral region    Referring Provider (PT) Dawley, Troy    Onset Date/Surgical Date 03/10/21    Next MD Visit 09/20/21      Precautions   Precautions None      Balance Screen   Has the patient fallen in the past 6 months No      Prior Function   Level of Independence Independent      Observation/Other Assessments   Focus on Therapeutic Outcomes (FOTO)  65      ROM / Strength   AROM / PROM / Strength AROM;Strength      AROM  AROM Assessment Site Lumbar    Lumbar Flexion WFL    Lumbar Extension WFL, pain end range    Lumbar - Right Side Bend WFL    Lumbar - Left Side Bend WFL    Lumbar - Right Rotation [facility]    Lumbar - Left Rotation [facility]      Strength   Strength Assessment Site Hip    Right/Left Hip Right;Left    Right Hip Flexion 4/5    Right Hip Extension 4/5    Right Hip ABduction 4+/5    Left Hip Flexion 4/5    Left Hip Extension 4/5    Left Hip ABduction 4+/5      Palpation   SI assessment  tender with grade 2-3 jt mobs CPA and UPA    Palpation comment bilat piriformis and glutes with increased mm spasticity                        Objective measurements completed on examination: See above findings.       [facility] Adult PT Treatment/Exercise - 08/25/21 0001       Exercises   Exercises Lumbar      Lumbar Exercises: Stretches   Piriformis Stretch 30 seconds;Right;Left    Piriformis Stretch Limitations table stretch      Lumbar Exercises: Supine   Bridge with [employer] 10 reps     Bridge with clamshell 10 reps    Bridge with Ball Squeeze Limitations red TB      Lumbar Exercises: Quadruped   Opposite Arm/Leg Raise Right arm/Left leg;Left arm/Right leg;5 reps                     PT Education - 08/25/21 1050     Education Details PT POC and goals, HEP, pelvic floor videos    Person(s) Educated Patient    Methods Explanation;Demonstration;Handout    Comprehension Verbalized understanding;Returned demonstration                 PT Long Term Goals - 08/25/21 1114       PT LONG TERM GOAL #1   Title Pt will be independent with HEP    Time 6    Period Weeks    Status New    Target Date 10/06/21      PT LONG TERM GOAL #2   Title Pt will improve FOTO to >= 66 to demo improved functional mobility    Time 6    Period Weeks    Status New    Target Date 10/06/21      PT LONG TERM GOAL #3   Title Pt will tolerate sitting x 1 hour with pain <= 2/10    Time 6    Period Weeks    Status New    Target Date 10/06/21                    Plan - 08/25/21 1111     Clinical Impression Statement Pt is a 70 y/o female 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 Pt presents with increased pain, decreased activity tolerance and strength and impaired functional mobility. Pt will benefit from skilled PT to address deficits and improve functional mobility and endurance.    Personal Factors and Comorbidities Comorbidity 2;Age;Past/Current Experience    Examination-Activity Limitations Sit    Examination-Participation Restrictions Driving;Community Activity    Stability/Clinical Decision Making Stable/Uncomplicated    Clinical Decision Making Low    Rehab Potential  Good    PT Frequency 2x / week    PT Duration 6 weeks    PT Treatment/Interventions Taping;Cryotherapy;Aquatic Therapy;Electrical Stimulation;Iontophoresis '4mg'$ /ml Dexamethasone;Moist Heat;Therapeutic activities;Therapeutic exercise;Balance training;Neuromuscular re-education;Patient/family  education;Manual techniques;Dry needling    PT Next Visit Plan assess HEP, dry needling?, core strength    PT Home Exercise Plan [email or web address]    Consulted and Agree with Plan of Care Patient             Patient will benefit from skilled therapeutic intervention in order to improve the following deficits and impairments:  Pain, Decreased strength, Decreased activity tolerance, Hypomobility, Decreased endurance  Visit Diagnosis: Muscle weakness (generalized) - Plan: PT plan of care cert/re-cert  Other symptoms and signs involving the musculoskeletal system - Plan: PT plan of care cert/re-cert     Problem List Patient Active Problem List   Diagnosis Date Noted   Vaginal dryness 08/02/2021   Gastritis 06/17/2021   Constipation 06/17/2021   Nausea and vomiting 06/17/2021   Hyponatremia 06/16/2021   PCP NOTES >>>>>>>>>>> 06/06/2021   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   Spinal stenosis, lumbar region, 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   Status post left foot surgery 05/15/2021   Anxiety and depression 04/26/2021   Cervicogenic headache 04/26/2021   Claustrophobia 04/26/2021   History of lumbar laminectomy 10/31/2020   Other specified glaucoma 10/31/2020   Left lumbar radiculopathy 06/21/2020   Malignant neoplasm of thyroid gland ([place]) 11/03/2019   Postoperative hypothyroidism 11/03/2019   Essential hypertension 12/11/2017    Mirtha Jain, PT 08/25/2021, 11:25 [place] [address] [place], [place], [address] Phone: [phone]   Fax:  [phone]  Name: Alyssa [name] MRN: [id number] Date of Birth: 24-Aug-1951

## 2021-08-28 ENCOUNTER — Encounter: Payer: Medicare Other | Admitting: Physical Therapy

## 2021-08-31 ENCOUNTER — Telehealth (HOSPITAL_BASED_OUTPATIENT_CLINIC_OR_DEPARTMENT_OTHER): Payer: Self-pay

## 2021-09-01 ENCOUNTER — Telehealth: Payer: Self-pay

## 2021-09-01 NOTE — Telephone Encounter (Signed)
Per Dr. Kennon Rounds called prescription to [facility] for patient to pick up. Left message for patient to make her aware.   prescription was for TP Testosterone 2 mg/g gel and I applied 2 clicks .5gm every morning  Kathrene Alu RN

## 2021-09-04 ENCOUNTER — Other Ambulatory Visit: Payer: Federal, State, Local not specified - PPO

## 2021-09-04 ENCOUNTER — Encounter: Payer: Medicare Other | Admitting: Rehabilitative and Restorative Service Providers"

## 2021-09-05 ENCOUNTER — Ambulatory Visit: Payer: Medicare Other | Admitting: Gastroenterology

## 2021-09-06 ENCOUNTER — Encounter: Payer: Medicare Other | Admitting: Physical Therapy

## 2021-09-11 ENCOUNTER — Encounter: Payer: Self-pay | Admitting: Physical Therapy

## 2021-09-11 ENCOUNTER — Other Ambulatory Visit: Payer: Self-pay

## 2021-09-11 ENCOUNTER — Ambulatory Visit (INDEPENDENT_AMBULATORY_CARE_PROVIDER_SITE_OTHER): Payer: Medicare Other | Admitting: Physical Therapy

## 2021-09-11 DIAGNOSIS — M6281 Muscle weakness (generalized): Secondary | ICD-10-CM | POA: Diagnosis present

## 2021-09-11 DIAGNOSIS — R29898 Other symptoms and signs involving the musculoskeletal system: Secondary | ICD-10-CM | POA: Diagnosis not present

## 2021-09-11 NOTE — Patient Instructions (Addendum)
Aquatic Therapy: What to Expect! ° °Where:  °[facility] [address] °[address] °[place], [place] [address] °[phone]          ° °How to Prepare: ° °If you require assistance with dressing, with transportation (ie: wheel chair), or toileting, a caregiver must attend the entire session with you (unless your primary therapists feels this is not necessary).   °If there is thunder during your appointment, you will be asked to leave pool area. You have the option to finish your session in the physical therapy area near the gym. °Masks in the pool area are optional. Your face will remain dry during your session, so you are welcome to keep your mask on, if desired. You will be spaced at least 6 feet from other aquatic patients.  °Please bring your own swim towel to dry off with.   °There are Men's and Women's locker rooms with showers, as well as gender neutral bathrooms in the pool area.  °Please arrive IN YOUR SUIT and a few minutes prior to your appointment - this helps to avoid delays in starting your session. °Head to the pool and await your appointment on the bench on the pool deck. °Please make sure to attend to any toileting needs prior to entering the pool. °Once on the pool deck your therapist will ask you to sign the Patient  Consent and Assignment of Benefits form. °Your therapist may take your blood pressure prior to, during and after your session if indicated. °We usually try and create a home exercise program based on activities we do in the pool. Some patients do not want to or do not have the ability to participate in an aquatic home program - this is not a barrier in any way to you participating in aquatic therapy as part of your current therapy plan! ° °Appointments:  All sessions are 45 minutes ° °About the pool: °Entering the pool: °Your therapist will assist you if needed; there are two ways to enter the pool - stairs or a mechanical lift. Your therapist will determine  the most appropriate way for you. °Water temperature is usually around 91-95°.  There is a lap pool with a temperature around 84° °There may be other therapists and patients in the pool at the same time.  ° °Contact Info:            °To cancel appointment, please call [facility] at [phone] °If you are running late, please call [facility] at [phone]    °        °     ° ° °

## 2021-09-11 NOTE — Therapy (Signed)
[place], [place], [address] Phone: [phone]   Fax:  [phone]  Physical Therapy Treatment  Patient Details  Name: Alyssa [name] MRN: [id number] Date of Birth: 06-18-1951 Referring Provider (PT): Dawley, Pieter Partridge   Encounter Date: 09/11/2021   PT End of Session - 09/11/21 1206     Visit Number 2    Number of Visits 12    Date for PT Re-Evaluation 10/06/21    Authorization - Visit Number 2    Progress Note Due on Visit 10    PT Start Time 0939    PT Stop Time 1020    PT Time Calculation (min) 41 min    Activity Tolerance Patient tolerated treatment well    Behavior During Therapy [facility] for tasks assessed/performed             Past Medical History:  Diagnosis Date   Anxiety and depression    Arthritis    Cervicogenic headache    Claustrophobia    Glaucoma    Hypertension    Lumbar radiculopathy    Postsurgical hypothyroidism    Spinal stenosis    Thyroid cancer [facility])    s/p thyroidectomy    Past Surgical History:  Procedure Laterality Date   FOOT SURGERY     LAMINOTOMY  04/03/2021   LEFT L3/4 LAMINOTOMY BILATERAL L3/4 MEDIAL FACETECTOMY   LUMBAR LAMINECTOMY  03/03/2018   THYROIDECTOMY  12/2004    There were no vitals filed for this visit.   Subjective Assessment - 09/11/21 0945     Subjective Pt reports that she hasn't been able to tolerate the exercises.  Her glutes have been very painful as well as her calves.    Pertinent History L4/L5 laminectomy 2019    Currently in Pain? Yes    Pain Score 7     Pain Location Back   down to bilat calves   Aggravating Factors  prolonged sitting over an hour    Pain Relieving Factors ?                [facility] PT Assessment - 09/11/21 0001       Assessment   Medical Diagnosis spondylosis sacral region    Referring Provider (PT) Dawley, Pieter Partridge    Onset Date/Surgical Date 03/10/21    Next MD Visit 09/20/21               [facility] Adult PT Treatment/Exercise - 09/11/21 0001       Self-Care   Self-Care Other Self-Care Comments    Other Self-Care Comments  pt instructed in self massage with roller stick to LE; pt returned demo with cues.      Lumbar Exercises: Stretches   Passive Hamstring Stretch Right;Left;2 reps;20 seconds   supine with strap   Single Knee to Chest Stretch Right;Left;1 rep;20 seconds    Quad Stretch Right;Left;2 reps;20 seconds   prone with strap   Piriformis Stretch Right;Left;2 reps;20 seconds   supine   Gastroc Stretch Right;Left;2 reps;20 seconds    Gastroc Stretch Limitations soleus stretchx 1 rep of 20 sec each side - in standing.    Other Lumbar Stretch Exercise pt demo'd calf stretches in seated and standing that she had been performing at home with yoga block.      Lumbar Exercises: Aerobic   Nustep L4: legs only x 5 min, slow pace      Lumbar Exercises: Seated   Other Seated Lumbar Exercises reviewed sit to  from supine; pt returned demo with cues.      Lumbar Exercises: Prone   Opposite Arm/Leg Raise Right arm/Left leg;Left arm/Right leg;5 reps;2 seconds   toes tucked with TKE     Lumbar Exercises: Quadruped   Madcat/Old Horse 5 reps    Other Quadruped Lumbar Exercises childs pose x 10 sec x 2                     PT Education - 09/11/21 1207     Education Details pool info; HEP    Person(s) Educated Patient    Methods Explanation;Handout;Demonstration;Verbal cues    Comprehension Verbalized understanding;Returned demonstration                 PT Long Term Goals - 08/25/21 1114       PT LONG TERM GOAL #1   Title Pt will be independent with HEP    Time 6    Period Weeks    Status New    Target Date 10/06/21      PT LONG TERM GOAL #2   Title Pt will improve FOTO to >= 66 to demo improved functional mobility    Time 6    Period Weeks    Status New    Target Date 10/06/21      PT LONG TERM GOAL #3   Title Pt will tolerate sitting x 1 hour  with pain <= 2/10    Time 6    Period Weeks    Status New    Target Date 10/06/21                   Plan - 09/11/21 1204     Clinical Impression Statement Pt returns to therapy for 2nd visit since eval.  Reviewed HEP and modified as tolerated wit focus on gentle stretches. All exercises tolerated well.  Pt reported 2 point reduction in pain after completion of session. Pt voiced interest in aquatic therapy; will incorporate pool exercises into HEP as pt has [employer].    Personal Factors and Comorbidities Comorbidity 2;Age;Past/Current Experience    Examination-Activity Limitations Sit    Examination-Participation Restrictions Driving;Community Activity    Stability/Clinical Decision Making Stable/Uncomplicated    Rehab Potential Good    PT Frequency 2x / week    PT Duration 6 weeks    PT Treatment/Interventions Taping;Cryotherapy;Aquatic Therapy;Electrical Stimulation;Iontophoresis 4mg /ml Dexamethasone;Moist Heat;Therapeutic activities;Therapeutic exercise;Balance training;Neuromuscular re-education;Patient/family education;Manual techniques;Dry needling    PT Next Visit Plan assess HEP changes.  issue exercises for pool;  DN as indicated.    PT Home Exercise Plan [id number]    Consulted and Agree with Plan of Care Patient             Patient will benefit from skilled therapeutic intervention in order to improve the following deficits and impairments:  Pain, Decreased strength, Decreased activity tolerance, Hypomobility, Decreased endurance  Visit Diagnosis: Muscle weakness (generalized)  Other symptoms and signs involving the musculoskeletal system     Problem List Patient Active Problem List   Diagnosis Date Noted   Vaginal dryness 08/02/2021   Gastritis 06/17/2021   Constipation 06/17/2021   Nausea and vomiting 06/17/2021   Hyponatremia 06/16/2021   PCP NOTES >>>>>>>>>>> 06/06/2021   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   Spinal stenosis,  lumbar region, 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   Status post left foot surgery 05/15/2021   Anxiety and depression 04/26/2021   Cervicogenic headache 04/26/2021   Claustrophobia 04/26/2021   History  of lumbar laminectomy 10/31/2020   Other specified glaucoma 10/31/2020   Left lumbar radiculopathy 06/21/2020   Malignant neoplasm of thyroid gland ([place]) 11/03/2019   Postoperative hypothyroidism 11/03/2019   Essential hypertension 12/11/2017   Kerin Perna, PTA 09/11/21 12:08 PM   [place], [place], [address] Phone: [phone]   Fax:  [phone]  Name: Alyssa [name] MRN: [id number] Date of Birth: 1951-03-08

## 2021-09-12 ENCOUNTER — Ambulatory Visit (INDEPENDENT_AMBULATORY_CARE_PROVIDER_SITE_OTHER): Payer: Medicare Other | Admitting: Gastroenterology

## 2021-09-12 ENCOUNTER — Encounter: Payer: Federal, State, Local not specified - PPO | Admitting: Internal Medicine

## 2021-09-12 ENCOUNTER — Other Ambulatory Visit: Payer: Medicare Other

## 2021-09-12 ENCOUNTER — Encounter: Payer: Self-pay | Admitting: Gastroenterology

## 2021-09-12 ENCOUNTER — Encounter: Payer: Self-pay | Admitting: Internal Medicine

## 2021-09-12 ENCOUNTER — Ambulatory Visit (INDEPENDENT_AMBULATORY_CARE_PROVIDER_SITE_OTHER): Payer: Medicare Other | Admitting: Internal Medicine

## 2021-09-12 VITALS — BP 112/74 | HR 74 | Ht 67.0 in | Wt 168.6 lb

## 2021-09-12 VITALS — BP 124/78 | HR 55 | Ht 67.0 in | Wt 172.4 lb

## 2021-09-12 DIAGNOSIS — Z85850 Personal history of malignant neoplasm of thyroid: Secondary | ICD-10-CM

## 2021-09-12 DIAGNOSIS — R195 Other fecal abnormalities: Secondary | ICD-10-CM | POA: Diagnosis not present

## 2021-09-12 DIAGNOSIS — Z8619 Personal history of other infectious and parasitic diseases: Secondary | ICD-10-CM

## 2021-09-12 DIAGNOSIS — E890 Postprocedural hypothyroidism: Secondary | ICD-10-CM | POA: Diagnosis not present

## 2021-09-12 DIAGNOSIS — R140 Abdominal distension (gaseous): Secondary | ICD-10-CM | POA: Diagnosis not present

## 2021-09-12 DIAGNOSIS — Z86010 Personal history of colonic polyps: Secondary | ICD-10-CM

## 2021-09-12 DIAGNOSIS — K5900 Constipation, unspecified: Secondary | ICD-10-CM | POA: Diagnosis not present

## 2021-09-12 NOTE — Patient Instructions (Signed)
If you are age 68 or older, your body mass index should be between 23-30. Your Body mass index is 27 kg/m. If this is out of the aforementioned range listed, please consider follow up with your Primary Care Provider.  If you are age 55 or younger, your body mass index should be between 19-25. Your Body mass index is 27 kg/m. If this is out of the aformentioned range listed, please consider follow up with your Primary Care Provider.   __________________________________________________________  The [place] [facility] providers would like to encourage you to use [facility] to communicate with providers for non-urgent requests or questions.  Due to long hold times on the telephone, sending your provider a message by [facility] may be a faster and more efficient way to get a response.  Please allow 48 business hours for a response.  Please remember that this is for non-urgent requests.   Please go to the lab on the 2nd floor [address] before you leave the office today.    You have been given a testing kit to check for small intestine bacterial overgrowth (SIBO) which is completed by a company named [facility]. Make sure to return your test in the mail using the return mailing label given you along with the kit. Your demographic and insurance information have already been sent to the company and they should be in contact with you over the next week regarding this test. Please keep in mind that you will be getting a call from phone number [phone] or a similar number. If you do not hear from them within this time frame, please call our office at [phone].    Take Miralax 1 capful mixed in 8 ounces of water at bed time for constipation as tolerated.  Thank you for choosing me and [facility].  Vito Cirigliano, D.O.

## 2021-09-12 NOTE — Patient Instructions (Signed)

## 2021-09-12 NOTE — Progress Notes (Signed)
Chief Complaint:    Constipation   GI History: 70 y.o. female with a history of HTN, glaucoma, spinal stenosis s/p L3/4 laminectomy, anxiety, depression, thyroid cancer  s/p postoperative thyroidectomy with hypothyroidism, recently relocated from [place], [place], initially seen in the GI clinic on 08/08/2021 for evaluation of constipation.  Evaluation to date notable for the following:  - 06/16/2021: Hospital admission for nausea, vomiting, constipation, hyponatremia - 06/16/2021: CT abdomen/pelvis: Small hepatic cysts, gastritis, constipation with descending colon thickening -06/27/2021: ER evaluation for continued constipation and RLQ tenderness.  Evaluation n/f Na 127, repeat CT with Moderate colonic fecal material without dilation/obstruction.  No wall thickening.  Probable hepatic cyst, otherwise normal liver, GB, pancreas. - 06/28/2021: Follow-up with PCM.  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  Symptoms more progressive since July 2022. - 08/15/2021: Sitz marker study + with 22/24 retained markers in the descending/left colon indicative of slow transit constipation.  Treated with MiraLAX bowel prep clear out then MiraLAX 2 cap/day - 08/28/2021: Normal CBC, CMP (Na 134), CRP, ESR, CK.  TSH 9.03   Endoscopic History: - Colonoscopy (10/08/2019, [facility]): Moderately excessive looping with moderate colonic spasm, 4 mm ascending polyp (SSP), 4 diminutive polyps in descending colon (HP's), 2 diminutive rectosigmoid polyps (HP's), internal hemorrhoids  HPI:     Patient is a 70 y.o. female presenting to the Gastroenterology Clinic for follow-up.  Initially seen by me on 08/08/2021.  Please refer to notes that day for more detailed history aside from outlined above.  Was evaluated with sits marker study which was grossly positive for  slow transit constipation with 22/24 markers retained in the left colon.  Was provided with MiraLAX bowel prep clear out then MiraLAX two cap/day with plan to titrate to effect.  If ineffective, discussed starting Motegrity.  Had appt with Endocronologist today with plan to adjust synthroid due to TSH 9.   Today, states sxs improved with purge prep above, and has since remained on Miralax 2 cap/day, but main issue now is of loose stools and increased frequency.  Some constipation symptoms at times, but more so bothered by the loose stools.    Separately, has a history of H. pylori and would like to be tested for eradication.  Additionally, would like to be tested for SIBO due to intermittent symptomatology and prior abnormal GI map done in [place] to evaluate for dysbiosis.   Review of systems:     No chest pain, no SOB, no fevers, no urinary sx   Past Medical History:  Diagnosis Date   Anxiety and depression    Arthritis    Cervicogenic headache    Claustrophobia    Glaucoma    Hypertension    Lumbar radiculopathy    Postsurgical hypothyroidism    Spinal stenosis    Thyroid cancer ([place])    s/p thyroidectomy    Patient's surgical history, family medical history, social history, medications and allergies were all reviewed in Epic    Current Outpatient Medications  Medication Sig Dispense Refill   acyclovir (ZOVIRAX) 400 MG tablet acyclovir 400 mg tablet  PRN     Estradiol 10 MCG TABS vaginal tablet Place 1 tablet (10 mcg total) vaginally 3 (three) times a week. 30 tablet 12   gabapentin (NEURONTIN) 300 MG capsule Take 300 mg by mouth 2 (two) times daily.     levothyroxine (SYNTHROID) 112 MCG  tablet Take 1 tablet by mouth 6 days weekly     timolol (TIMOPTIC) 0.5 % ophthalmic solution timolol maleate 0.5 % eye drops  once daily     traMADol (ULTRAM) 50 MG tablet Take by mouth.     TRAVOPROST OP Apply to eye.     traZODone (DESYREL) 100 MG tablet Take 100 mg by mouth at  bedtime.     No current facility-administered medications for this visit.    Physical Exam:     BP 124/78   Pulse (!) 55   Ht 5' 7"  (1.702 m)   Wt 172 lb 6 oz (78.2 kg)   SpO2 98%   BMI 27.00 kg/m   GENERAL:  Pleasant female in NAD PSYCH: : Cooperative, normal affect NEURO: Alert and oriented x 3, no focal neurologic deficits   IMPRESSION and PLAN:    1) Chronic constipation 2) Change in bowel habits Sits marker with 22 retained markers in the left colon.  Improvement with MiraLAX purge prep, but sounds like 2 cap/day to efficacious - Reduce MiraLAX to 1 cap/day and discussed titrating up/down as needed - Depending on response to and need for continue MiraLAX, discussed possibly changing to [place] adequate hydration  3) History of colon polyp - Colonoscopy 2020 with 1 subcentimeter SSP and a few smaller HP's - Repeat colonoscopy in 2025 for ongoing surveillance  4) History of H. pylori - Reports history of H. pylori previously treated precasting confirmation of eradication - Order H. pylori stool Ag (not taking any acid suppression meds)  5) Abdominal bloating - SIBO/IMO breath testing   I spent 35 minutes of time, including in depth chart review, independent review of results as outlined above, communicating results with the patient directly, face-to-face time with the patient, coordinating care, and ordering studies and medications as appropriate, and documentation.           Lavena Bullion ,DO, FACG 09/12/2021, 3:48 PM

## 2021-09-12 NOTE — Progress Notes (Signed)
Name: Alyssa [name]  MRN/ DOB: [id number], [birth year]    Age/ Sex: 70 y.o., female    PCP: Lequita Halt, MD   Reason for Endocrinology Evaluation: PTC     Date of Initial Endocrinology Evaluation: 09/12/2021     HPI: Alyssa [name] is a 70 y.o. female with a past medical history of postsurgical hypothyroidism, anxiety, depression and Hx of PTC. The patient presented for initial endocrinology clinic visit on 09/12/2021 for consultative assistance with her PTC.   She is S/P total thyroidectomy in 12/16/2004 . She had limited right paratracheal neck dissection.   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 Tg < 0.9 with negative Ab BIOPSY 02/2016 at [facility] of the right thyroid bed. This was fluid and 0.5 cc was aspirated. Pathology showed a few histiocytes and very scant debris only.    Serial ultrasounds 2018-2021 continued to show multiple thyroid nodules in the thyroid bed  In 2021 there was a 9 mm left level IV hypervascular node, and short term follow up was recommended. . Attempt to biopsy was unsatisfactory     Her endocrinologist attempted to increase levothyroxine to 137 mcg in 2020 with a TSH 5.52 uIU/ML but she felt overdosed and this was reduced to 125 mcg .    She takes LT-4 replacement appropriately  Has noted weight gain No recent constipation  No local neck symptom  No prior exposure to radiation  No Biotin intake   Bother with thyroid cancer, mother with thyroid disease    HISTORY:  Past Medical History:  Past Medical History:  Diagnosis Date   Anxiety and depression    Arthritis    Cervicogenic headache    Claustrophobia    Glaucoma    Hypertension    Lumbar radiculopathy    Postsurgical hypothyroidism    Spinal stenosis    Thyroid cancer [facility])    s/p  thyroidectomy   Past Surgical History:  Past Surgical History:  Procedure Laterality Date   FOOT SURGERY     LAMINOTOMY  04/03/2021   LEFT L3/4 LAMINOTOMY BILATERAL L3/4 MEDIAL FACETECTOMY   LUMBAR LAMINECTOMY  03/03/2018   THYROIDECTOMY  12/2004    Social History:  reports that she has never smoked. She has never used smokeless tobacco. She reports current alcohol use. She reports that she does not use drugs. Family History: family history includes Diabetes in her paternal grandmother; Hypertension in her sister; Skin cancer in her brother; Thyroid cancer in her brother.   HOME MEDICATIONS: Allergies as of 09/12/2021       Reactions   Amoxicillin Hives        Medication List        Accurate as of September 12, 2021 12:38 PM. If you have any questions, ask your nurse or doctor.          STOP taking these medications    clindamycin 150 MG capsule Commonly known as: CLEOCIN Stopped by: Dorita Sciara, MD   cyclobenzaprine 5 MG tablet Commonly known as: FLEXERIL Stopped by: Dorita Sciara, MD   methylPREDNISolone 4 MG Tbpk tablet Commonly known as: MEDROL DOSEPAK Stopped by: Dorita Sciara, MD   zolpidem 12.5 MG CR tablet Commonly known as: AMBIEN CR Stopped by: Dorita Sciara,  MD       TAKE these medications    acyclovir 400 MG tablet Commonly known as: ZOVIRAX acyclovir 400 mg tablet  PRN   Estradiol 10 MCG Tabs vaginal tablet Place 1 tablet (10 mcg total) vaginally 3 (three) times a week.   gabapentin 300 MG capsule Commonly known as: NEURONTIN Take 300 mg by mouth 2 (two) times daily.   levothyroxine 112 MCG tablet Commonly known as: SYNTHROID Take 1 tablet by mouth 6 days weekly   timolol 0.5 % ophthalmic solution Commonly known as: TIMOPTIC timolol maleate 0.5 % eye drops  once daily   traMADol 50 MG tablet Commonly known as: ULTRAM Take by mouth.   TRAVOPROST OP Apply to eye.   traZODone 100 MG  tablet Commonly known as: DESYREL Take 100 mg by mouth at bedtime.          REVIEW OF SYSTEMS: A comprehensive ROS was conducted with the patient and is negative except as per HPI     OBJECTIVE:  VS: BP 112/74 (BP Location: Left Arm, Patient Position: Sitting, Cuff Size: Small)   Pulse 74   Ht _0  (1.702 m)   Wt 168 lb 9.6 oz (76.5 kg)   SpO2 98%   BMI 26.41 kg/m    Wt Readings from Last 3 Encounters:  09/12/21 168 lb 9.6 oz (76.5 kg)  08/08/21 164 lb 8 oz (74.6 kg)  08/02/21 164 lb (74.4 kg)     EXAM: General: Pt appears well and is in NAD  Neck: General: Supple without adenopathy. Thyroid:  No goiter or nodules appreciated.   Lungs: Clear with good BS bilat with no rales, rhonchi, or wheezes  Heart: Auscultation: RRR.  Abdomen: Normoactive bowel sounds, soft, nontender, without masses or organomegaly palpable  Extremities:  BL LE: No pretibial edema normal ROM and strength.  Mental Status: Judgment, insight: Intact Orientation: Oriented to time, place, and person Mood and affect: No depression, anxiety, or agitation     DATA REVIEWED: Results for Alyssa [name], Alyssa [name] (MRN [id number]) as of 09/13/2021 13:43  Ref. Range 09/12/2021 15:10  TSH Latest Ref Range: 0.35 - 5.50 uIU/mL 5.06  Thyroglobulin Latest Units: ng/mL <0.1 (L)  Thyroglobulin Ab Latest Ref Range: < or = 1 IU/mL <1       09/07/2021 TSH 9.03 uIU/mL    Thyroid pathology 2006 Pathology : Multofocal papillary carcinoma, right lobe 6 mm , left lobe 2 mm , no L.N mets   ASSESSMENT/PLAN/RECOMMENDATIONS:   Hx of PTC:   - S/P total thyroidectomy in 2006. S/P RAI ablation with 100 mCi in 01/2005 - Her thyroid bed ultrasound started showing L.N growth in 2017. Attempted at FNA of two nodules has come back as unsatisfactory, this has been stable up until 2021 - Will proceed with thyroid bed ultrasound  - Will consider a WBS - Tg and Tg Ab pending  - She is at intermediate risk for recurrent with a  TSH goal 0.1-0.5 uIU/Ml but per pt she is unable to tolerate this level of TSH and has been in agreement with her previous endocrinologist to keep it around 1.0 uIU/mL   - TSH goal ~ 1.0 uIU/mL   2. Postoperative Hypothyroidism:  - Pt noted weight gain  - Recent TSh at PCP 's office 9.0 uIU/mL  - Pt educated extensively on the correct way to take levothyroxine (first thing in the morning with water, 30 minutes before eating or taking other medications). - Pt encouraged to double dose the  following day if she were to miss a dose given long half-life of levothyroxine.  - Repeat TSh elevated at 5.06 uIU/mL    Medications : Increase Levothyroxine 112 mcg , TWO tabs on Sundays and 1 tab rest of the week    Repeat TSH in 6 weeks      F/U in 6 months    Signed electronically by: Mack Guise, MD  [facility] Endocrinology  [facility] [address] [place], [place] [address] Phone: [phone] FAX: [phone]   CC: Lequita Halt, [place] [address] Phone: [phone] Fax: [phone]   Return to Endocrinology clinic as below: Future Appointments  Date Time Provider [place]  09/12/2021  2:45 PM [facility] LAB [facility]  09/12/2021  3:40 PM Cirigliano, Dominic Pea, DO [facility]  09/15/2021  2:00 PM Kendall Flack [facility]  09/18/2021  9:30 AM Shelbie Hutching [facility]  09/20/2021 11:00 AM Orlene Och, Danae Orleans, PT [facility]  12/12/2021 10:00 AM [facility] 1 [facility]  03/13/2022  9:10 AM Coulton Schlink, Melanie Crazier, MD [facility]

## 2021-09-13 ENCOUNTER — Other Ambulatory Visit (HOSPITAL_BASED_OUTPATIENT_CLINIC_OR_DEPARTMENT_OTHER): Payer: Self-pay

## 2021-09-13 ENCOUNTER — Telehealth: Payer: Self-pay | Admitting: General Surgery

## 2021-09-13 ENCOUNTER — Encounter: Payer: Medicare Other | Admitting: Physical Therapy

## 2021-09-13 LAB — THYROGLOBULIN LEVEL: Thyroglobulin: <0.1 ng/mL — ABNORMAL LOW

## 2021-09-13 LAB — TSH: TSH: 5.06 u[IU]/mL (ref 0.35–5.50)

## 2021-09-13 LAB — THYROGLOBULIN ANTIBODY: Thyroglobulin Ab: <1 IU/mL (ref <=1)

## 2021-09-13 MED ORDER — INFLUENZA VAC A&B SA ADJ QUAD 0.5 ML IM PRSY
PREFILLED_SYRINGE | INTRAMUSCULAR | 0 refills | Status: DC
  Filled 2021-09-13: qty 0.5, 1d supply, fill #0

## 2021-09-13 MED ORDER — LEVOTHYROXINE SODIUM 112 MCG PO TABS: 112.0000 ug | ORAL_TABLET | ORAL | 3 refills | Status: DC

## 2021-09-13 NOTE — Telephone Encounter (Signed)
Faxed SIBO order sheet to [facility]- requested for them to contact the patient with her copay information and instructions on performing the test. The patient is aware to not take the test until she hears from one of their customer service reps.

## 2021-09-14 ENCOUNTER — Encounter: Payer: Self-pay | Admitting: Internal Medicine

## 2021-09-15 ENCOUNTER — Other Ambulatory Visit: Payer: Self-pay

## 2021-09-15 ENCOUNTER — Ambulatory Visit (INDEPENDENT_AMBULATORY_CARE_PROVIDER_SITE_OTHER): Payer: Medicare Other | Admitting: Physical Therapy

## 2021-09-15 DIAGNOSIS — R29898 Other symptoms and signs involving the musculoskeletal system: Secondary | ICD-10-CM | POA: Diagnosis not present

## 2021-09-15 DIAGNOSIS — M6281 Muscle weakness (generalized): Secondary | ICD-10-CM

## 2021-09-15 NOTE — Therapy (Signed)
[place], [place], [address] Phone: [phone]   Fax:  [phone]  Physical Therapy Treatment  Patient Details  Name: Alyssa [name] MRN: [id number] Date of Birth: November 22, 1951 Referring Provider (PT): Dawley, Pieter Partridge   Encounter Date: 09/15/2021   PT End of Session - 09/15/21 1411     Visit Number 3    Number of Visits 12    Date for PT Re-Evaluation 10/06/21    Authorization - Visit Number 3    Progress Note Due on Visit 10    PT Start Time 6720    PT Stop Time 1450    PT Time Calculation (min) 45 min    Activity Tolerance Patient tolerated treatment well    Behavior During Therapy [facility] for tasks assessed/performed             Past Medical History:  Diagnosis Date   Anxiety and depression    Arthritis    Cervicogenic headache    Claustrophobia    Glaucoma    Hypertension    Lumbar radiculopathy    Postsurgical hypothyroidism    Spinal stenosis    Thyroid cancer [facility])    s/p thyroidectomy    Past Surgical History:  Procedure Laterality Date   FOOT SURGERY     LAMINOTOMY  04/03/2021   LEFT L3/4 LAMINOTOMY BILATERAL L3/4 MEDIAL FACETECTOMY   LUMBAR LAMINECTOMY  03/03/2018   THYROIDECTOMY  12/2004    There were no vitals filed for this visit.   Subjective Assessment - 09/15/21 1412     Subjective Pt reports that the stretches have been helpful.  She had some fascial work done yesterday, to her glutes and upper back/neck.  "I'm sore today".   she has used TENS with some relief too.    Pertinent History L4/L5 laminectomy 2019    Diagnostic tests x ray shows multilevel degenerative changes    Patient Stated Goals decrease pain and return to bike riding    Currently in Pain? Yes    Pain Score 6     Pain Location Buttocks    Pain Orientation Right;Mid    Pain Descriptors / Indicators Sore               Pt seen for aquatic therapy today.  Treatment took place in water  3.25-4 ft in depth at the [employer] [facility] pool. Temp of water was 94.  Pt entered/exited the pool via stairs independently with single rail.  Treatment:   Without UE support:   forward gait, backward gait, side stepping- with/without squat.  High knee marching forward/ backward. Walking lunge forward.   Squats pushing dumbbell under the water x 10.   Forward row with blue floatation dumbbells x 10.  Trial of cat/cow - limited tolerance (will keep to land exercise),  Warrior 1 - lifting noodle overhead x 5 reps, each leg forward.   Holding onto wall:  hip abdct x 10, hip flex/ext x 10, gentle heel raises/ toe raises x 12,  calf stretch x 15 sec x 2 reps each side. Hamstring curls x 10 each.   Forward step ups on 4" step (in water up to sternum) x 10 each leg.  Lateral step up x 5 each side.    Pt requires buoyancy for support and to offload joints with strengthening exercises. Viscosity of the water is needed for resistance of strengthening; water current perturbations provides challenge to standing balance unsupported, requiring increased core activation.  PT Long Term Goals - 08/25/21 1114       PT LONG TERM GOAL #1   Title Pt will be independent with HEP    Time 6    Period Weeks    Status New    Target Date 10/06/21      PT LONG TERM GOAL #2   Title Pt will improve FOTO to >= 66 to demo improved functional mobility    Time 6    Period Weeks    Status New    Target Date 10/06/21      PT LONG TERM GOAL #3   Title Pt will tolerate sitting x 1 hour with pain <= 2/10    Time 6    Period Weeks    Status New    Target Date 10/06/21                   Plan - 09/15/21 1417     Clinical Impression Statement Positive response to change in HEP exercises.  Pt tolerated exercises well, reporting reduction of pain in bilat glutes.  Added aquatic exercises to HEP.  Pt reported reduction of tightness in feet as well (reports metatarsalgia upon arrival).  Progressing well  towards goals.    Personal Factors and Comorbidities Comorbidity 2;Age;Past/Current Experience    Examination-Activity Limitations Sit    Examination-Participation Restrictions Driving;Community Activity    Stability/Clinical Decision Making Stable/Uncomplicated    Rehab Potential Good    PT Frequency 2x / week    PT Duration 6 weeks    PT Treatment/Interventions Taping;Cryotherapy;Aquatic Therapy;Electrical Stimulation;Iontophoresis 4mg /ml Dexamethasone;Moist Heat;Therapeutic activities;Therapeutic exercise;Balance training;Neuromuscular re-education;Patient/family education;Manual techniques;Dry needling    PT Next Visit Plan continue progressive gentle strengthening / stretching.    PT Home Exercise Plan [email or web address]    Consulted and Agree with Plan of Care Patient             Patient will benefit from skilled therapeutic intervention in order to improve the following deficits and impairments:  Pain, Decreased strength, Decreased activity tolerance, Hypomobility, Decreased endurance  Visit Diagnosis: Muscle weakness (generalized)  Other symptoms and signs involving the musculoskeletal system     Problem List Patient Active Problem List   Diagnosis Date Noted   Vaginal dryness 08/02/2021   Gastritis 06/17/2021   Constipation 06/17/2021   Nausea and vomiting 06/17/2021   Hyponatremia 06/16/2021   PCP NOTES >>>>>>>>>>> 06/06/2021   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   Spinal stenosis, lumbar region, 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   Status post left foot surgery 05/15/2021   Anxiety and depression 04/26/2021   Cervicogenic headache 04/26/2021   Claustrophobia 04/26/2021   History of lumbar laminectomy 10/31/2020   Other specified glaucoma 10/31/2020   Left lumbar radiculopathy 06/21/2020   Malignant neoplasm of thyroid gland ([place]) 11/03/2019   Postoperative hypothyroidism 11/03/2019   Essential hypertension 12/11/2017    Kerin Perna,  PTA 09/15/21 3:20 PM   [place] [address] [place], [place], [address] Phone: [phone]   Fax:  [phone]  Name: Alyssa [name] MRN: [id number] Date of Birth: 1951-04-26

## 2021-09-18 ENCOUNTER — Other Ambulatory Visit: Payer: Self-pay

## 2021-09-18 ENCOUNTER — Encounter: Payer: Self-pay | Admitting: Physical Therapy

## 2021-09-18 ENCOUNTER — Ambulatory Visit (INDEPENDENT_AMBULATORY_CARE_PROVIDER_SITE_OTHER): Payer: Medicare Other | Admitting: Physical Therapy

## 2021-09-18 DIAGNOSIS — R29898 Other symptoms and signs involving the musculoskeletal system: Secondary | ICD-10-CM

## 2021-09-18 DIAGNOSIS — M6281 Muscle weakness (generalized): Secondary | ICD-10-CM

## 2021-09-18 NOTE — Therapy (Signed)
[name], [place], [address] Phone: [phone]   Fax:  [phone]  Physical Therapy Treatment  Patient Details  Name: Alyssa [name] MRN: [id number] Date of Birth: May 13, 1951 Referring Provider (PT): Dawley, Pieter Partridge   Encounter Date: 09/18/2021   PT End of Session - 09/18/21 0939     Visit Number 4    Number of Visits 12    Date for PT Re-Evaluation 10/06/21    Authorization - Visit Number 4    Progress Note Due on Visit 10    PT Start Time 0934    PT Stop Time 1023    PT Time Calculation (min) 49 min    Activity Tolerance Patient tolerated treatment well    Behavior During Therapy [facility] for tasks assessed/performed             Past Medical History:  Diagnosis Date   Anxiety and depression    Arthritis    Cervicogenic headache    Claustrophobia    Glaucoma    Hypertension    Lumbar radiculopathy    Postsurgical hypothyroidism    Spinal stenosis    Thyroid cancer [facility])    s/p thyroidectomy    Past Surgical History:  Procedure Laterality Date   FOOT SURGERY     LAMINOTOMY  04/03/2021   LEFT L3/4 LAMINOTOMY BILATERAL L3/4 MEDIAL FACETECTOMY   LUMBAR LAMINECTOMY  03/03/2018   THYROIDECTOMY  12/2004    There were no vitals filed for this visit.   Subjective Assessment - 09/18/21 0939     Subjective Pt reports that the aquatics woke some things up in her feet.   She has notices some improvement in LE with the aquatics and land exercises.    Pertinent History L4/L5 laminectomy 2019    Currently in Pain? Yes    Pain Score 7     Pain Location Back   into buttocks and upper back   Pain Orientation Left;Right    Pain Descriptors / Indicators Sore    Aggravating Factors  prolonged sitting over an hour.    Pain Relieving Factors TENS                [facility] PT Assessment - 09/18/21 0001       Assessment   Medical Diagnosis spondylosis sacral region    Referring Provider  (PT) Dawley, Pieter Partridge    Onset Date/Surgical Date 03/10/21    Next MD Visit 09/20/21             [facility] Adult PT Treatment/Exercise - 09/18/21 0001       Self-Care   Other Self-Care Comments  pt instructed in self massage with ball to glutes (against wall); pt returned demo with cues.      Lumbar Exercises: Stretches   Passive Hamstring Stretch Right;Left;2 reps;20 seconds   supine with strap   Quad Stretch Right;Left;2 reps;20 seconds   prone with strap   Gastroc Stretch Right;Left;2 reps;20 seconds    Gastroc Stretch Limitations soleus stretchx  2 rep of 20 sec each side - in standing.      Lumbar Exercises: Aerobic   Nustep L4: arms/legs x 5 min for warm up. improved speed.      Lumbar Exercises: Standing   Row Both;10 reps;Strengthening    Theraband Level (Row) Level 2 (Red)    Shoulder Extension Strengthening;Both;10 reps    Theraband Level (Shoulder Extension) Level 2 (Red)    Other Standing Lumbar Exercises opp arm/leg  x 5 each    Other Standing Lumbar Exercises x band side step with red band x 10 each side.      Lumbar Exercises: Prone   Opposite Arm/Leg Raise Right arm/Left leg;Left arm/Right leg;5 reps      Lumbar Exercises: Quadruped   Madcat/Old Horse 5 reps   2 sets   Other Quadruped Lumbar Exercises childs pose x 10 sec x 2      Modalities   Modalities Electrical Stimulation;Moist Heat      Moist Heat Therapy   Number Minutes Moist Heat 10 Minutes    Moist Heat Location Lumbar Spine;Hip      Electrical Stimulation   Electrical Stimulation Location bilat glutes    Electrical Stimulation Action IFC    Electrical Stimulation Parameters 10 min, intensity to tolerance    Electrical Stimulation Goals Pain                     PT Education - 09/18/21 1037     Education Details HEP, issued red band    Person(s) Educated Patient    Methods Explanation;Handout;Verbal cues;Tactile cues;Demonstration    Comprehension Verbalized understanding;Returned  demonstration                 PT Long Term Goals - 08/25/21 1114       PT LONG TERM GOAL #1   Title Pt will be independent with HEP    Time 6    Period Weeks    Status New    Target Date 10/06/21      PT LONG TERM GOAL #2   Title Pt will improve FOTO to >= 66 to demo improved functional mobility    Time 6    Period Weeks    Status New    Target Date 10/06/21      PT LONG TERM GOAL #3   Title Pt will tolerate sitting x 1 hour with pain <= 2/10    Time 6    Period Weeks    Status New    Target Date 10/06/21                   Plan - 09/18/21 1013     Clinical Impression Statement Pt reported reduction of glute pain to 5/10 during exercises and furhter reduction with estim/MHP.  Pt tolerated all exercises well. Continuing to progresss lumbar stabilization and gentle stretches.  Progressing towards LTGs.    Personal Factors and Comorbidities Comorbidity 2;Age;Past/Current Experience    Examination-Activity Limitations Sit    Examination-Participation Restrictions Driving;Community Activity    Stability/Clinical Decision Making Stable/Uncomplicated    Rehab Potential Good    PT Frequency 2x / week    PT Duration 6 weeks    PT Treatment/Interventions Taping;Cryotherapy;Aquatic Therapy;Electrical Stimulation;Iontophoresis 4mg /ml Dexamethasone;Moist Heat;Therapeutic activities;Therapeutic exercise;Balance training;Neuromuscular re-education;Patient/family education;Manual techniques;Dry needling    PT Next Visit Plan continue progressive gentle strengthening / stretching.  Trial anti-rotation exercise.    PT Home Exercise Plan [email or web address]    Consulted and Agree with Plan of Care Patient             Patient will benefit from skilled therapeutic intervention in order to improve the following deficits and impairments:  Pain, Decreased strength, Decreased activity tolerance, Hypomobility, Decreased endurance  Visit Diagnosis: Muscle weakness  (generalized)  Other symptoms and signs involving the musculoskeletal system     Problem List Patient Active Problem List   Diagnosis Date Noted   Vaginal dryness 08/02/2021  Gastritis 06/17/2021   Constipation 06/17/2021   Nausea and vomiting 06/17/2021   Hyponatremia 06/16/2021   PCP NOTES >>>>>>>>>>> 06/06/2021   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   Spinal stenosis, lumbar region, 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   Status post left foot surgery 05/15/2021   Anxiety and depression 04/26/2021   Cervicogenic headache 04/26/2021   Claustrophobia 04/26/2021   History of lumbar laminectomy 10/31/2020   Other specified glaucoma 10/31/2020   Left lumbar radiculopathy 06/21/2020   Malignant neoplasm of thyroid gland ([place]) 11/03/2019   Postoperative hypothyroidism 11/03/2019   Essential hypertension 12/11/2017   Kerin Perna, PTA 09/18/21 10:40 AM   [place] [address] [place], [place], [address] Phone: [phone]   Fax:  [phone]  Name: BLYSS LUGAR MRN: [id number] Date of Birth: 18-Aug-1951

## 2021-09-18 NOTE — Patient Instructions (Signed)
Access Code: [email or web address] URL: [email or web address] Date: 09/18/2021 Prepared by: Sharpes  Exercises Hooklying Hamstring Stretch with Strap - 2 x daily - 7 x weekly - 1 sets - 2 reps - 20 hold Supine Piriformis Stretch with Foot on Ground - 2 x daily - 7 x weekly - 1 sets - 2 reps - 20 hold Prone Quadriceps Stretch with Strap - 2 x daily - 7 x weekly - 1 sets - 2 reps - 20 hold Gastroc Stretch on Wall - 2 x daily - 7 x weekly - 1 sets - 2 reps - 20 hold Soleus Stretch on Wall - 2 x daily - 7 x weekly - 1 sets - 2 reps - 20 hold Prone Alternating Arm and Leg Lifts - 1 x daily - 7 x weekly - 1-2 sets - 5 reps Supine Transversus Abdominis Bracing - Hands on Thighs - 1 x daily - 7 x weekly - 1 sets - 5-10 reps - 5 hold Standing Hip Abduction Adduction at [place] - 1 x daily - 7 x weekly - 3 sets - 10 reps Standing Hip Flexion Extension at [employer] - 1 x daily - 7 x weekly - 3 sets - 10 reps Squat - 1 x daily - 7 x weekly - 1 sets - 10 reps Backward Walking - 1 x daily - 7 x weekly - 3 sets - 10 reps Forward March - 1 x daily - 7 x weekly - 3 sets - 10 reps Alternating Forward Lunge - 1 x daily - 7 x weekly - 2 sets - 10 reps Warrior I in [employer] with [employer] - 1 x daily - 7 x weekly - 1 sets - 5 reps Standing Bilateral Low Shoulder Row with Anchored Resistance - 1 x daily - 3 x weekly - 2 sets - 10 reps Shoulder extension with resistance - Neutral - 1 x daily - 3 x weekly - 2 sets - 10 reps X Band Walk - 1 x daily - 3 x weekly - 2 sets - 10 reps

## 2021-09-19 ENCOUNTER — Ambulatory Visit (HOSPITAL_BASED_OUTPATIENT_CLINIC_OR_DEPARTMENT_OTHER)
Admission: RE | Admit: 2021-09-19 | Discharge: 2021-09-19 | Disposition: A | Discharge status: Home / Self Care | Payer: Medicare Other | Source: Ambulatory Visit | Attending: Internal Medicine | Admitting: Internal Medicine

## 2021-09-19 DIAGNOSIS — Z85850 Personal history of malignant neoplasm of thyroid: Secondary | ICD-10-CM | POA: Insufficient documentation

## 2021-09-20 ENCOUNTER — Other Ambulatory Visit: Payer: Self-pay

## 2021-09-20 ENCOUNTER — Ambulatory Visit (INDEPENDENT_AMBULATORY_CARE_PROVIDER_SITE_OTHER): Payer: Medicare Other | Admitting: Physical Therapy

## 2021-09-20 DIAGNOSIS — M6281 Muscle weakness (generalized): Secondary | ICD-10-CM | POA: Diagnosis present

## 2021-09-20 DIAGNOSIS — R29898 Other symptoms and signs involving the musculoskeletal system: Secondary | ICD-10-CM

## 2021-09-20 NOTE — Patient Instructions (Signed)
Access Code: [email or web address] URL: [email or web address] Date: 09/20/2021 Prepared by: Isabelle Course  Exercises Hooklying Hamstring Stretch with Strap - 2 x daily - 7 x weekly - 1 sets - 2 reps - 20 hold Supine Piriformis Stretch with Foot on Ground - 2 x daily - 7 x weekly - 1 sets - 2 reps - 20 hold Prone Quadriceps Stretch with Strap - 2 x daily - 7 x weekly - 1 sets - 2 reps - 20 hold Gastroc Stretch on Wall - 2 x daily - 7 x weekly - 1 sets - 2 reps - 20 hold Soleus Stretch on Wall - 2 x daily - 7 x weekly - 1 sets - 2 reps - 20 hold Prone Alternating Arm and Leg Lifts - 1 x daily - 7 x weekly - 1-2 sets - 5 reps Supine Transversus Abdominis Bracing - Hands on Thighs - 1 x daily - 7 x weekly - 1 sets - 5-10 reps - 5 hold Standing Hip Abduction Adduction at [place] - 1 x daily - 7 x weekly - 3 sets - 10 reps Standing Hip Flexion Extension at [employer] - 1 x daily - 7 x weekly - 3 sets - 10 reps Squat - 1 x daily - 7 x weekly - 1 sets - 10 reps Backward Walking - 1 x daily - 7 x weekly - 3 sets - 10 reps Forward March - 1 x daily - 7 x weekly - 3 sets - 10 reps Alternating Forward Lunge - 1 x daily - 7 x weekly - 2 sets - 10 reps Warrior I in [employer] with [employer] - 1 x daily - 7 x weekly - 1 sets - 5 reps Standing Bilateral Low Shoulder Row with Anchored Resistance - 1 x daily - 3 x weekly - 2 sets - 10 reps Shoulder extension with resistance - Neutral - 1 x daily - 3 x weekly - 2 sets - 10 reps X Band Walk - 1 x daily - 3 x weekly - 2 sets - 10 reps Supine Cervical Retraction with Towel - 1 x daily - 7 x weekly - 1 sets - 10 reps - 5 seconds hold Doorway Pec Stretch at 90 Degrees Abduction - 1 x daily - 7 x weekly - 3 sets - 1 reps - 20-30 sec hold

## 2021-09-20 NOTE — Therapy (Signed)
[place], [place], [address] Phone: [phone]   Fax:  [phone]  Physical Therapy Treatment  Patient Details  Name: Alyssa [name] MRN: [id number] Date of Birth: [birth year] Referring Provider (PT): Dawley, Pieter Partridge   Encounter Date: 09/20/2021   PT End of Session - 09/20/21 1132     Visit Number 5    Number of Visits 12    Date for PT Re-Evaluation 10/06/21    Authorization - Visit Number 5    Progress Note Due on Visit 10    PT Start Time 1100    PT Stop Time 1140    PT Time Calculation (min) 40 min    Activity Tolerance Patient tolerated treatment well    Behavior During Therapy [facility] for tasks assessed/performed             Past Medical History:  Diagnosis Date   Anxiety and depression    Arthritis    Cervicogenic headache    Claustrophobia    Glaucoma    Hypertension    Lumbar radiculopathy    Postsurgical hypothyroidism    Spinal stenosis    Thyroid cancer [facility])    s/p thyroidectomy    Past Surgical History:  Procedure Laterality Date   FOOT SURGERY     LAMINOTOMY  04/03/2021   LEFT L3/4 LAMINOTOMY BILATERAL L3/4 MEDIAL FACETECTOMY   LUMBAR LAMINECTOMY  03/03/2018   THYROIDECTOMY  12/2004    There were no vitals filed for this visit.   Subjective Assessment - 09/20/21 1107     Subjective Pt presents with new referral for cervical spondylosis. She states she has some soreness and stiffness in her neck which is also causing some facial symptoms. She states that the pain is there "all the time". Moist heat helps reduce pain    Pertinent History L4/L5 laminectomy 2019, C6/C7 spondylosis    Patient Stated Goals decrease pain and return to bike riding    Currently in Pain? Yes    Pain Score 8     Pain Location Generalized   LEs and neck   Pain Descriptors / Indicators Sore                [facility] PT Assessment - 09/20/21 0001       Assessment   Medical  Diagnosis spondylosis sacral region, cervical spondylosis    Referring Provider (PT) Dawley, [place]    Onset Date/Surgical Date 03/10/21    Next MD Visit 09/20/21      AROM   AROM Assessment Site Cervical    Cervical Flexion 40    Cervical Extension 18    Cervical - Right Side Bend 35    Cervical - Left Side Bend 25    Cervical - Right Rotation 35    Cervical - Left Rotation 40      Palpation   Palpation comment increased mm spasticity cervical paraspinals Lt > Rt, bilat suboccipitals, bilat pecs and upper traps                           [facility] Adult PT Treatment/Exercise - 09/20/21 0001       Exercises   Exercises Neck      Neck Exercises: Supine   Neck Retraction 5 reps;5 secs      Neck Exercises: Stretches   Other Neck Stretches doorway stretch at 90 2 x 30 sec      Moist Heat Therapy  Number Minutes Moist Heat 10 Minutes    Moist Heat Location Lumbar Spine      Electrical Stimulation   Electrical Stimulation Location lumbar    Electrical Stimulation Action IFC    Electrical Stimulation Parameters to tolerance    Electrical Stimulation Goals Pain      Manual Therapy   Manual Therapy Soft tissue mobilization    Soft tissue mobilization STM bilat cervical paraspinals and upper trap, suboccipital release bilat                     PT Education - 09/20/21 1128     Education Details cervical HEP    Person(s) Educated Patient    Methods Explanation;Demonstration;Handout    Comprehension Returned demonstration;Verbalized understanding                 PT Long Term Goals - 09/20/21 1129       PT LONG TERM GOAL #4   Title Pt will improve cervical side bending and rotation ROM to 45 degrees bilat    Time 6    Period Weeks    Status New    Target Date 10/06/21                   Plan - 09/20/21 1132     Clinical Impression Statement Pt seen today for evaluation of cervical spondylosis. Pt presents with decreased  cervical ROM and increased mm spasticity and "soreness". Pt will benefit from skilled PT to address deficits and improve mobility    Personal Factors and Comorbidities Comorbidity 2;Age;Past/Current Experience    Examination-Activity Limitations Sit    Examination-Participation Restrictions Driving;Community Activity    Stability/Clinical Decision Making Stable/Uncomplicated    Rehab Potential Good    PT Frequency 2x / week    PT Duration 6 weeks    PT Treatment/Interventions Taping;Cryotherapy;Aquatic Therapy;Electrical Stimulation;Iontophoresis 4mg /ml Dexamethasone;Moist Heat;Therapeutic activities;Therapeutic exercise;Balance training;Neuromuscular re-education;Patient/family education;Manual techniques;Dry needling    PT Next Visit Plan continue progressive gentle strengthening / stretching for lumbar and cervical.  Trial anti-rotation exercise.    PT Home Exercise Plan [email or web address]    Consulted and Agree with Plan of Care Patient             Patient will benefit from skilled therapeutic intervention in order to improve the following deficits and impairments:  Pain, Decreased strength, Decreased activity tolerance, Hypomobility, Decreased endurance, Increased muscle spasms  Visit Diagnosis: Muscle weakness (generalized) - Plan: PT plan of care cert/re-cert  Other symptoms and signs involving the musculoskeletal system - Plan: PT plan of care cert/re-cert     Problem List Patient Active Problem List   Diagnosis Date Noted   Vaginal dryness 08/02/2021   Gastritis 06/17/2021   Constipation 06/17/2021   Nausea and vomiting 06/17/2021   Hyponatremia 06/16/2021   PCP NOTES >>>>>>>>>>> 06/06/2021   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   Spinal stenosis, lumbar region, 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   Status post left foot surgery 05/15/2021   Anxiety and depression 04/26/2021   Cervicogenic headache 04/26/2021   Claustrophobia 04/26/2021   History of lumbar  laminectomy 10/31/2020   Other specified glaucoma 10/31/2020   Left lumbar radiculopathy 06/21/2020   Malignant neoplasm of thyroid gland ([place]) 11/03/2019   Postoperative hypothyroidism 11/03/2019   Essential hypertension 12/11/2017    Alante Tolan, PT 09/20/2021, 11:40 AM  [facility] [place] [address] [place], [place], [address] Phone: [phone]   Fax:  [phone]  Name: Alyssa [name]  MRN: [id number] Date of Birth: May 27, 1951

## 2021-09-25 ENCOUNTER — Encounter: Payer: Medicare Other | Admitting: Physical Therapy

## 2021-09-25 ENCOUNTER — Other Ambulatory Visit: Payer: Self-pay

## 2021-09-25 ENCOUNTER — Telehealth: Payer: Self-pay

## 2021-09-25 ENCOUNTER — Ambulatory Visit (INDEPENDENT_AMBULATORY_CARE_PROVIDER_SITE_OTHER): Payer: Medicare Other

## 2021-09-25 ENCOUNTER — Other Ambulatory Visit (HOSPITAL_COMMUNITY)
Admission: RE | Admit: 2021-09-25 | Discharge: 2021-09-25 | Disposition: A | Discharge status: Home / Self Care | Payer: Medicare Other | Source: Ambulatory Visit | Attending: Obstetrics & Gynecology | Admitting: Obstetrics & Gynecology

## 2021-09-25 VITALS — BP 128/60 | HR 63 | Wt 171.0 lb

## 2021-09-25 DIAGNOSIS — R350 Frequency of micturition: Secondary | ICD-10-CM

## 2021-09-25 DIAGNOSIS — N898 Other specified noninflammatory disorders of vagina: Secondary | ICD-10-CM | POA: Insufficient documentation

## 2021-09-25 LAB — POCT URINALYSIS DIPSTICK
Bilirubin, UA: NEGATIVE
Glucose, UA: NEGATIVE
Ketones, UA: NEGATIVE
Nitrite, UA: NEGATIVE
Protein, UA: NEGATIVE
Spec Grav, UA: 1.02 (ref 1.010–1.025)
Urobilinogen, UA: 0.2 E.U./dL
pH, UA: 7.5 (ref 5.0–8.0)

## 2021-09-25 NOTE — Progress Notes (Addendum)
SUBJECTIVE:  70 y.o. female complains of white vaginal discharge for 2 day(s). Denies abnormal vaginal bleeding or significant pelvic pain or fever. No UTI symptoms. Denies history of known exposure to STD.  No LMP recorded. Patient is postmenopausal.  OBJECTIVE:  She appears well, afebrile. Urine dipstick: positive for WBC's and small leukocytes.Urinary frequency x 1 day.  ASSESSMENT:  Vaginal Discharge    PLAN:  BVAG, CVAG probe, and urine culture sent to lab. Treatment: To be determined once lab results are received ROV prn if symptoms persist or worsen. Dannae Kato l George Haggart, CMA   Attestation of Attending Supervision of CMA/RN: Evaluation and management procedures were performed by the nurse under my supervision and collaboration.  I have reviewed the nursing note and chart, and I agree with the management and plan.  Carolyn L. Harraway-Smith, M.D., Cherlynn June

## 2021-09-25 NOTE — Telephone Encounter (Signed)
error 

## 2021-09-26 ENCOUNTER — Other Ambulatory Visit: Payer: Medicare Other

## 2021-09-26 DIAGNOSIS — R140 Abdominal distension (gaseous): Secondary | ICD-10-CM

## 2021-09-26 DIAGNOSIS — K5900 Constipation, unspecified: Secondary | ICD-10-CM

## 2021-09-26 DIAGNOSIS — R195 Other fecal abnormalities: Secondary | ICD-10-CM

## 2021-09-27 LAB — CERVICOVAGINAL ANCILLARY ONLY
Bacterial Vaginitis (gardnerella): NEGATIVE
Candida Glabrata: NEGATIVE
Candida Vaginitis: NEGATIVE
Comment: NEGATIVE
Comment: NEGATIVE
Comment: NEGATIVE

## 2021-09-27 LAB — URINE CULTURE

## 2021-09-27 LAB — HELICOBACTER PYLORI  SPECIAL ANTIGEN
MICRO NUMBER:: 12517584
SPECIMEN QUALITY: ADEQUATE

## 2021-09-28 ENCOUNTER — Telehealth (HOSPITAL_BASED_OUTPATIENT_CLINIC_OR_DEPARTMENT_OTHER): Payer: Self-pay

## 2021-09-29 ENCOUNTER — Ambulatory Visit (INDEPENDENT_AMBULATORY_CARE_PROVIDER_SITE_OTHER): Payer: Medicare Other | Admitting: Physical Therapy

## 2021-09-29 ENCOUNTER — Other Ambulatory Visit: Payer: Self-pay

## 2021-09-29 DIAGNOSIS — M6281 Muscle weakness (generalized): Secondary | ICD-10-CM | POA: Diagnosis present

## 2021-09-29 DIAGNOSIS — R29898 Other symptoms and signs involving the musculoskeletal system: Secondary | ICD-10-CM | POA: Diagnosis not present

## 2021-09-29 NOTE — Therapy (Addendum)
[name], [place], [address] Phone: [phone]   Fax:  [phone]  Physical Therapy Treatment and Discharge  Patient Details  Name: Alyssa [name] MRN: [id number] Date of Birth: [birth year] Referring Provider (PT): Dawley, Pieter Partridge   Encounter Date: 09/29/2021   PT End of Session - 09/29/21 1006     Visit Number 6    Number of Visits 12    Date for PT Re-Evaluation 10/06/21    Authorization - Visit Number 6    Progress Note Due on Visit 10    PT Start Time 0930    PT Stop Time 1015    PT Time Calculation (min) 45 min    Activity Tolerance Patient tolerated treatment well    Behavior During Therapy [facility] for tasks assessed/performed             Past Medical History:  Diagnosis Date   Anxiety and depression    Arthritis    Cervicogenic headache    Claustrophobia    Glaucoma    Hypertension    Lumbar radiculopathy    Postsurgical hypothyroidism    Spinal stenosis    Thyroid cancer [facility])    s/p thyroidectomy    Past Surgical History:  Procedure Laterality Date   FOOT SURGERY     LAMINOTOMY  04/03/2021   LEFT L3/4 LAMINOTOMY BILATERAL L3/4 MEDIAL FACETECTOMY   LUMBAR LAMINECTOMY  03/03/2018   THYROIDECTOMY  12/2004    There were no vitals filed for this visit.   Subjective Assessment - 09/29/21 0931     Subjective Pt states she feels "stiff" but not in pain. She has done her home stretches and states that she feels her shoulders are "more loose'    Patient Stated Goals decrease pain and return to bike riding    Currently in Pain? No/denies                [facility] PT Assessment - 09/29/21 0001       Assessment   Medical Diagnosis spondylosis sacral region, cervical spondylosis    Referring Provider (PT) Dawley, [place]    Onset Date/Surgical Date 03/10/21      Palpation   Palpation comment increased cervical mm spasticity and increased spasticity in bilat upper traps and  levator                           [facility] Adult PT Treatment/Exercise - 09/29/21 0001       Neck Exercises: Seated   Neck Retraction 10 reps;5 secs      Neck Exercises: Stretches   Upper Trapezius Stretch Right;Left;3 reps;20 seconds    Other Neck Stretches doorway stretch at 90 2 x 30 sec    Other Neck Stretches cervical rotation x 5 bilat, posterior shoulder rolls x 10      Lumbar Exercises: Aerobic   Nustep L5 x 5 min UEs and LEs      Moist Heat Therapy   Number Minutes Moist Heat 10 Minutes    Moist Heat Location Lumbar Spine      Electrical Stimulation   Electrical Stimulation Location lumbar    Electrical Stimulation Action IFC    Electrical Stimulation Parameters to tolerance    Electrical Stimulation Goals Pain      Manual Therapy   Manual therapy comments skilled palpation to assess effects of dry needling    Soft tissue mobilization STM bilat cervical paraspinals and upper  trap, suboccipital release bilat              Trigger Point Dry Needling - 09/29/21 0001     Consent Given? Yes    Education Handout Provided Yes    Muscles Treated Head and Neck Upper trapezius;Levator scapulae;Cervical multifidi    Other Dry Needling prone    Upper Trapezius Response Twitch reponse elicited    Levator Scapulae Response Twitch response elicited    Cervical multifidi Response Twitch reponse elicited                   PT Education - 09/29/21 1005     Education Details dry needling    Person(s) Educated Patient    Methods Explanation;Demonstration;Handout    Comprehension Verbalized understanding                 PT Long Term Goals - 09/20/21 1129       PT LONG TERM GOAL #4   Title Pt will improve cervical side bending and rotation ROM to 45 degrees bilat    Time 6    Period Weeks    Status New    Target Date 10/06/21                   Plan - 09/29/21 1006     Clinical Impression Statement Pt with multiple trigger  points throughotu cervical paraspinals, upper traps and levator, good response to dry needlinge. Gentle cervical ROM and stretching encouraged to continue to progress mobility and decreased mm tightness. lumbar exercises held due to pt c/o soreness from performign HEP yesterday    PT Next Visit Plan try antirotation exercise, gentle strengthening for lumbar and cervial, how was needling?    PT Home Exercise Plan [id number]    Consulted and Agree with Plan of Care Patient             Patient will benefit from skilled therapeutic intervention in order to improve the following deficits and impairments:     Visit Diagnosis: Muscle weakness (generalized)  Other symptoms and signs involving the musculoskeletal system     Problem List Patient Active Problem List   Diagnosis Date Noted   Vaginal dryness 08/02/2021   Gastritis 06/17/2021   Constipation 06/17/2021   Nausea and vomiting 06/17/2021   Hyponatremia 06/16/2021   PCP NOTES >>>>>>>>>>> 06/06/2021   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   Spinal stenosis, lumbar region, 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   Status post left foot surgery 05/15/2021   Anxiety and depression 04/26/2021   Cervicogenic headache 04/26/2021   Claustrophobia 04/26/2021   History of lumbar laminectomy 10/31/2020   Other specified glaucoma 10/31/2020   Left lumbar radiculopathy 06/21/2020   Malignant neoplasm of thyroid gland (HCC) 11/03/2019   Postoperative hypothyroidism 11/03/2019   Essential hypertension 12/11/2017   PHYSICAL THERAPY DISCHARGE SUMMARY  Visits from Start of Care: 6  Current functional level related to goals / functional outcomes: Improved flexibility and decreased pain   Remaining deficits: See above   Education / Equipment: HEP   Patient agrees to discharge. Patient goals were partially met. Patient is being discharged due to not returning since the last visit.   , PT,DPT11/18/2210:35  AM   ,, PT 09/29/2021, 10:18 AM  [facility] [address] [place], [place], [address] Phone: [phone]   Fax:  [phone]  Name: Alyssa [name] MRN: [id number] Date of Birth: 04/16/1951    

## 2021-10-02 ENCOUNTER — Encounter: Payer: Medicare Other | Admitting: Physical Therapy

## 2021-10-03 LAB — COMPREHENSIVE METABOLIC PANEL
Albumin: 4.6 (ref 3.5–5.0)
Calcium: 9.6 (ref 8.7–10.7)
GFR calc non Af Amer: 94

## 2021-10-03 LAB — BASIC METABOLIC PANEL
BUN: 9 (ref 4–21)
CO2: 25 — AB (ref 13–22)
Chloride: 99 (ref 99–108)
Creatinine: 0.7 (ref <=1.1)
Glucose: 77
Potassium: 4.2 (ref 3.4–5.3)
Sodium: 132 — AB (ref 137–147)

## 2021-10-06 ENCOUNTER — Encounter: Payer: Medicare Other | Admitting: Physical Therapy

## 2021-10-08 ENCOUNTER — Encounter: Payer: Self-pay | Admitting: Gastroenterology

## 2021-10-13 NOTE — Telephone Encounter (Signed)
Inbound call from [facility]. States need clinician signature and diagnostic code fax [phone]

## 2021-10-17 NOTE — Telephone Encounter (Signed)
I have not seen the SIBO results.  I am in the hospital all this week, so if the results were placed on my [place] office desk, we can forward those electronically to me for review.  Otherwise, I will keep a look out for those results when they are back.  Agree with plan for OV.  Thanks.

## 2021-10-19 ENCOUNTER — Telehealth: Payer: Self-pay | Admitting: Gastroenterology

## 2021-10-19 NOTE — Telephone Encounter (Signed)
Inbound call from [facility]. States they need the requisition order form for SIBO and signature from provider sent to [phone]

## 2021-10-19 NOTE — Telephone Encounter (Signed)
Faxed lab requisition for 2nd time to the attention of Waunita Schooner at [facility]

## 2021-10-25 ENCOUNTER — Telehealth: Payer: Self-pay | Admitting: Gastroenterology

## 2021-10-25 MED ORDER — RIFAXIMIN 550 MG PO TABS: 550.0000 mg | ORAL_TABLET | Freq: Three times a day (TID) | ORAL | 0 refills | Status: AC

## 2021-10-25 MED ORDER — NEOMYCIN SULFATE 500 MG PO TABS: 500.0000 mg | ORAL_TABLET | Freq: Two times a day (BID) | ORAL | 0 refills | Status: AC

## 2021-10-25 NOTE — Telephone Encounter (Signed)
Received results from the recent [facility] Breath Testing study which are notable for the following:  - Increased methanogen at 39 ppm with normal being <12 ppm.  This is consistent with Intestinal Methanogen Overgrowth and will plan on treating as below.  There is otherwise not an increase in hydrogen.  - Neomycin 500 mg p.o. twice daily x14 days - Rifaximin 550 mg p.o. 3 times daily x14 days - Start probiotic towards the end of the treatment course and continue for at least 4 weeks beyond completion of antibiotic course

## 2021-10-25 NOTE — Addendum Note (Signed)
Addended by: Lanny Hurst A on: 10/25/2021 01:51 PM   Modules accepted: Orders

## 2021-10-25 NOTE — Telephone Encounter (Signed)
Left a detailed message on patients VM-sent rxs to the patients pharmacy. Also sent a message via mychart regarding her SIBO test

## 2021-11-07 NOTE — Telephone Encounter (Signed)
The rifaximin is considered a prebiotic (slightly different antibiotic).  Possibly she is referring to starting a probiotic, such as align.  I always recommend starting this at the tail end of treatment course and continue for at least 4 weeks beyond completion of neomycin/rifaximin.

## 2021-11-07 NOTE — Telephone Encounter (Signed)
Patient did not start the neomycin at the same time of xifaxin. It was unavaliable at the pharmacies she tried. Went over the treatment and the patient states she will wait until after her surgery tomorrow to start the rest of her treatment.

## 2021-11-13 ENCOUNTER — Telehealth: Payer: Self-pay | Admitting: Gastroenterology

## 2021-11-13 NOTE — Telephone Encounter (Signed)
Patient called stating she has had back surgery recently.  The pain meds she was given has caused her to be constipated and she hasn't had a BM since last Tuesday which is causing her more pain.  She has tried various things, but so far, none have worked.  Can you please call and let her know what she can do?  Thank you.

## 2021-11-13 NOTE — Telephone Encounter (Signed)
Called and spoke with patient. She states that she has back surgery on Wednesday and is on pain medication which has caused worsening constipation. Pt states that she has tried a double dose of Miralax, Dulcolax, and smooth move tea with no relief. Advised patient on how to complete a Miralax bowel purge. Advised pt that once she completes the bowel purge she can continue the Miralax up to 3 times a day as needed. Advised pt that if her stools are too loose then she can cut back on the Miralax. Pt verbalized understanding of all recommendations and had no other concerns at the end of the call.

## 2021-11-16 ENCOUNTER — Ambulatory Visit: Payer: Medicare Other | Admitting: Gastroenterology

## 2021-11-20 ENCOUNTER — Encounter: Payer: Self-pay | Admitting: Gastroenterology

## 2021-11-20 NOTE — Telephone Encounter (Signed)
Mouth pain and mouth soreness can occur in 10% of patients with neomycin, so this is possible to be the etiology for type symptoms.  Neurotoxicity has been reported in <1% of individuals taking neomycin.  If mouth pain is worsening or becoming prohibitive, recommend stopping neomycin.

## 2021-11-26 ENCOUNTER — Encounter (HOSPITAL_BASED_OUTPATIENT_CLINIC_OR_DEPARTMENT_OTHER): Payer: Self-pay | Admitting: *Deleted

## 2021-11-26 ENCOUNTER — Other Ambulatory Visit: Payer: Self-pay

## 2021-11-26 ENCOUNTER — Emergency Department (HOSPITAL_BASED_OUTPATIENT_CLINIC_OR_DEPARTMENT_OTHER): Payer: Medicare Other

## 2021-11-26 ENCOUNTER — Inpatient Hospital Stay (HOSPITAL_BASED_OUTPATIENT_CLINIC_OR_DEPARTMENT_OTHER)
Admission: EM | Admit: 2021-11-26 | Discharge: 2021-11-28 | DRG: 313 | Disposition: A | Discharge status: Home / Self Care | Payer: Medicare Other | Attending: Internal Medicine | Admitting: Internal Medicine

## 2021-11-26 DIAGNOSIS — G8929 Other chronic pain: Secondary | ICD-10-CM

## 2021-11-26 DIAGNOSIS — R778 Other specified abnormalities of plasma proteins: Secondary | ICD-10-CM | POA: Diagnosis not present

## 2021-11-26 DIAGNOSIS — X501XXA Overexertion from prolonged static or awkward postures, initial encounter: Secondary | ICD-10-CM | POA: Diagnosis not present

## 2021-11-26 DIAGNOSIS — E039 Hypothyroidism, unspecified: Secondary | ICD-10-CM | POA: Diagnosis not present

## 2021-11-26 DIAGNOSIS — Z880 Allergy status to penicillin: Secondary | ICD-10-CM

## 2021-11-26 DIAGNOSIS — E871 Hypo-osmolality and hyponatremia: Secondary | ICD-10-CM | POA: Diagnosis present

## 2021-11-26 DIAGNOSIS — E785 Hyperlipidemia, unspecified: Secondary | ICD-10-CM | POA: Diagnosis not present

## 2021-11-26 DIAGNOSIS — M2550 Pain in unspecified joint: Secondary | ICD-10-CM | POA: Diagnosis not present

## 2021-11-26 DIAGNOSIS — Z981 Arthrodesis status: Secondary | ICD-10-CM

## 2021-11-26 DIAGNOSIS — G629 Polyneuropathy, unspecified: Secondary | ICD-10-CM | POA: Diagnosis present

## 2021-11-26 DIAGNOSIS — Z85850 Personal history of malignant neoplasm of thyroid: Secondary | ICD-10-CM | POA: Diagnosis not present

## 2021-11-26 DIAGNOSIS — R0789 Other chest pain: Principal | ICD-10-CM | POA: Diagnosis present

## 2021-11-26 DIAGNOSIS — R079 Chest pain, unspecified: Secondary | ICD-10-CM | POA: Diagnosis present

## 2021-11-26 DIAGNOSIS — E860 Dehydration: Secondary | ICD-10-CM | POA: Diagnosis not present

## 2021-11-26 DIAGNOSIS — M19072 Primary osteoarthritis, left ankle and foot: Secondary | ICD-10-CM | POA: Diagnosis present

## 2021-11-26 DIAGNOSIS — Z808 Family history of malignant neoplasm of other organs or systems: Secondary | ICD-10-CM | POA: Diagnosis not present

## 2021-11-26 DIAGNOSIS — Z79899 Other long term (current) drug therapy: Secondary | ICD-10-CM | POA: Diagnosis not present

## 2021-11-26 DIAGNOSIS — M791 Myalgia, unspecified site: Secondary | ICD-10-CM | POA: Diagnosis not present

## 2021-11-26 DIAGNOSIS — Z8249 Family history of ischemic heart disease and other diseases of the circulatory system: Secondary | ICD-10-CM | POA: Diagnosis not present

## 2021-11-26 DIAGNOSIS — F40240 Claustrophobia: Secondary | ICD-10-CM | POA: Diagnosis present

## 2021-11-26 DIAGNOSIS — F32A Depression, unspecified: Secondary | ICD-10-CM | POA: Diagnosis not present

## 2021-11-26 DIAGNOSIS — I10 Essential (primary) hypertension: Secondary | ICD-10-CM | POA: Diagnosis present

## 2021-11-26 DIAGNOSIS — Z79890 Hormone replacement therapy (postmenopausal): Secondary | ICD-10-CM | POA: Diagnosis not present

## 2021-11-26 DIAGNOSIS — R112 Nausea with vomiting, unspecified: Secondary | ICD-10-CM

## 2021-11-26 DIAGNOSIS — Z833 Family history of diabetes mellitus: Secondary | ICD-10-CM | POA: Diagnosis not present

## 2021-11-26 DIAGNOSIS — Z20822 Contact with and (suspected) exposure to covid-19: Secondary | ICD-10-CM | POA: Diagnosis not present

## 2021-11-26 DIAGNOSIS — Y929 Unspecified place or not applicable: Secondary | ICD-10-CM | POA: Diagnosis not present

## 2021-11-26 DIAGNOSIS — K5900 Constipation, unspecified: Secondary | ICD-10-CM | POA: Diagnosis not present

## 2021-11-26 DIAGNOSIS — M461 Sacroiliitis, not elsewhere classified: Secondary | ICD-10-CM | POA: Diagnosis not present

## 2021-11-26 DIAGNOSIS — R7989 Other specified abnormal findings of blood chemistry: Secondary | ICD-10-CM

## 2021-11-26 DIAGNOSIS — R0600 Dyspnea, unspecified: Secondary | ICD-10-CM

## 2021-11-26 LAB — TROPONIN I (HIGH SENSITIVITY)
Troponin I (High Sensitivity): 34 ng/L — ABNORMAL HIGH (ref <18)
Troponin I (High Sensitivity): 40 ng/L — ABNORMAL HIGH (ref <18)

## 2021-11-26 LAB — CBC WITH DIFFERENTIAL/PLATELET
Abs Immature Granulocytes: 0.01 10*3/uL (ref 0.00–0.07)
Basophils Absolute: 0.2 10*3/uL — ABNORMAL HIGH (ref 0.0–0.1)
Basophils Relative: 3 %
Eosinophils Absolute: 0 10*3/uL (ref 0.0–0.5)
Eosinophils Relative: 1 %
HCT: 38.2 % (ref 36.0–46.0)
Hemoglobin: 13.6 g/dL (ref 12.0–15.0)
Immature Granulocytes: 0 %
Lymphocytes Relative: 26 %
Lymphs Abs: 1.4 10*3/uL (ref 0.7–4.0)
MCH: 31.1 pg (ref 26.0–34.0)
MCHC: 35.6 g/dL (ref 30.0–36.0)
MCV: 87.4 fL (ref 80.0–100.0)
Monocytes Absolute: 0.4 10*3/uL (ref 0.1–1.0)
Monocytes Relative: 8 %
Neutro Abs: 3.3 10*3/uL (ref 1.7–7.7)
Neutrophils Relative %: 62 %
Platelets: 285 10*3/uL (ref 150–400)
RBC: 4.37 MIL/uL (ref 3.87–5.11)
RDW: 12.1 % (ref 11.5–15.5)
WBC: 5.2 10*3/uL (ref 4.0–10.5)
nRBC: 0 % (ref 0.0–0.2)

## 2021-11-26 LAB — COMPREHENSIVE METABOLIC PANEL
ALT: 27 U/L (ref 0–44)
AST: 29 U/L (ref 15–41)
Albumin: 4.5 g/dL (ref 3.5–5.0)
Alkaline Phosphatase: 76 U/L (ref 38–126)
Anion gap: 10 (ref 5–15)
BUN: 12 mg/dL (ref 8–23)
CO2: 22 mmol/L (ref 22–32)
Calcium: 9.3 mg/dL (ref 8.9–10.3)
Chloride: 100 mmol/L (ref 98–111)
Creatinine, Ser: 0.74 mg/dL (ref 0.44–1.00)
GFR, Estimated: >60 mL/min (ref >60)
Glucose, Bld: 93 mg/dL (ref 70–99)
Potassium: 3.6 mmol/L (ref 3.5–5.1)
Sodium: 132 mmol/L — ABNORMAL LOW (ref 135–145)
Total Bilirubin: 0.8 mg/dL (ref 0.3–1.2)
Total Protein: 7.4 g/dL (ref 6.5–8.1)

## 2021-11-26 LAB — LIPASE, BLOOD: Lipase: 37 U/L (ref 11–51)

## 2021-11-26 LAB — RESP PANEL BY RT-PCR (FLU A&B, COVID) ARPGX2
Influenza A by PCR: NEGATIVE
Influenza B by PCR: NEGATIVE
SARS Coronavirus 2 by RT PCR: NEGATIVE

## 2021-11-26 LAB — BRAIN NATRIURETIC PEPTIDE: B Natriuretic Peptide: 56.6 pg/mL (ref 0.0–100.0)

## 2021-11-26 LAB — URINALYSIS, ROUTINE W REFLEX MICROSCOPIC
Bilirubin Urine: NEGATIVE
Glucose, UA: NEGATIVE mg/dL
Hgb urine dipstick: NEGATIVE
Ketones, ur: 40 mg/dL — AB
Leukocytes,Ua: NEGATIVE
Nitrite: NEGATIVE
Protein, ur: NEGATIVE mg/dL
Specific Gravity, Urine: 1.01 (ref 1.005–1.030)
pH: 7.5 (ref 5.0–8.0)

## 2021-11-26 MED ORDER — LACTATED RINGERS IV BOLUS
1000.0000 mL | Freq: Once | INTRAVENOUS | Status: AC
  Administered 2021-11-26: 09:00:00 1000 mL via INTRAVENOUS

## 2021-11-26 MED ORDER — TRAZODONE HCL 100 MG PO TABS
100.0000 mg | ORAL_TABLET | Freq: Every day | ORAL | Status: DC
  Administered 2021-11-26: 23:00:00 100 mg via ORAL
  Filled 2021-11-26: qty 1

## 2021-11-26 MED ORDER — KETOROLAC TROMETHAMINE 15 MG/ML IJ SOLN
15.0000 mg | Freq: Three times a day (TID) | INTRAMUSCULAR | Status: DC | PRN
  Administered 2021-11-26 – 2021-11-27 (×3): 15 mg via INTRAVENOUS
  Filled 2021-11-26 (×3): qty 1

## 2021-11-26 MED ORDER — ASPIRIN EC 81 MG PO TBEC: 81.0000 mg | DELAYED_RELEASE_TABLET | Freq: Every day | ORAL | Status: DC

## 2021-11-26 MED ORDER — ASPIRIN EC 81 MG PO TBEC
81.0000 mg | DELAYED_RELEASE_TABLET | Freq: Every day | ORAL | Status: DC
  Administered 2021-11-27 – 2021-11-28 (×2): 81 mg via ORAL
  Filled 2021-11-26 (×2): qty 1

## 2021-11-26 MED ORDER — OXYCODONE HCL 5 MG PO TABS
5.0000 mg | ORAL_TABLET | Freq: Four times a day (QID) | ORAL | Status: DC | PRN
  Administered 2021-11-26 – 2021-11-27 (×3): 5 mg via ORAL
  Filled 2021-11-26 (×3): qty 1

## 2021-11-26 MED ORDER — MORPHINE SULFATE (PF) 4 MG/ML IV SOLN
4.0000 mg | Freq: Once | INTRAVENOUS | Status: AC
  Administered 2021-11-26: 10:00:00 4 mg via INTRAVENOUS
  Filled 2021-11-26: qty 1

## 2021-11-26 MED ORDER — ASPIRIN 81 MG PO CHEW
324.0000 mg | CHEWABLE_TABLET | Freq: Once | ORAL | Status: AC
  Administered 2021-11-26: 10:00:00 324 mg via ORAL
  Filled 2021-11-26: qty 4

## 2021-11-26 MED ORDER — DULOXETINE HCL 20 MG PO CPEP
20.0000 mg | ORAL_CAPSULE | Freq: Every day | ORAL | Status: DC
  Filled 2021-11-26: qty 1

## 2021-11-26 MED ORDER — ENOXAPARIN SODIUM 40 MG/0.4ML IJ SOSY
40.0000 mg | PREFILLED_SYRINGE | INTRAMUSCULAR | Status: DC
  Administered 2021-11-26 – 2021-11-27 (×2): 40 mg via SUBCUTANEOUS
  Filled 2021-11-26 (×2): qty 0.4

## 2021-11-26 MED ORDER — TIMOLOL MALEATE 0.5 % OP SOLN
1.0000 [drp] | Freq: Every day | OPHTHALMIC | Status: DC
  Administered 2021-11-27: 10:00:00 1 [drp] via OPHTHALMIC
  Filled 2021-11-26: qty 5

## 2021-11-26 MED ORDER — LEVOTHYROXINE SODIUM 112 MCG PO TABS: 224.0000 ug | ORAL_TABLET | ORAL | Status: DC

## 2021-11-26 MED ORDER — IOHEXOL 350 MG/ML SOLN
100.0000 mL | Freq: Once | INTRAVENOUS | Status: AC | PRN
  Administered 2021-11-26: 10:00:00 100 mL via INTRAVENOUS

## 2021-11-26 MED ORDER — LEVOTHYROXINE SODIUM 112 MCG PO TABS
112.0000 ug | ORAL_TABLET | ORAL | Status: DC
  Administered 2021-11-27: 05:00:00 112 ug via ORAL
  Filled 2021-11-26: qty 1

## 2021-11-26 MED ORDER — TRAMADOL HCL 50 MG PO TABS
50.0000 mg | ORAL_TABLET | Freq: Four times a day (QID) | ORAL | Status: DC | PRN
  Administered 2021-11-26 – 2021-11-27 (×3): 50 mg via ORAL
  Filled 2021-11-26 (×4): qty 1

## 2021-11-26 MED ORDER — HYDROCODONE-ACETAMINOPHEN 5-325 MG PO TABS
1.0000 | ORAL_TABLET | Freq: Once | ORAL | Status: AC
  Administered 2021-11-26: 14:00:00 1 via ORAL
  Filled 2021-11-26: qty 1

## 2021-11-26 MED ORDER — HYDRALAZINE HCL 25 MG PO TABS: 25.0000 mg | ORAL_TABLET | Freq: Four times a day (QID) | ORAL | Status: DC | PRN

## 2021-11-26 MED ORDER — ONDANSETRON HCL 4 MG/2ML IJ SOLN
4.0000 mg | Freq: Four times a day (QID) | INTRAMUSCULAR | Status: DC | PRN
  Administered 2021-11-27: 05:00:00 4 mg via INTRAVENOUS
  Filled 2021-11-26 (×2): qty 2

## 2021-11-26 MED ORDER — GABAPENTIN 300 MG PO CAPS
300.0000 mg | ORAL_CAPSULE | Freq: Two times a day (BID) | ORAL | Status: DC
  Administered 2021-11-26: 23:00:00 300 mg via ORAL
  Filled 2021-11-26 (×2): qty 1

## 2021-11-26 MED ORDER — LEVOTHYROXINE SODIUM 112 MCG PO TABS: 112.0000 ug | ORAL_TABLET | ORAL | Status: DC

## 2021-11-26 MED ORDER — ESTRADIOL 0.1 MG/GM VA CREA
TOPICAL_CREAM | VAGINAL | Status: DC
  Filled 2021-11-26: qty 42.5

## 2021-11-26 MED ORDER — ACETAMINOPHEN 325 MG PO TABS: 650.0000 mg | ORAL_TABLET | ORAL | Status: DC | PRN

## 2021-11-26 MED ORDER — ATORVASTATIN CALCIUM 10 MG PO TABS
10.0000 mg | ORAL_TABLET | Freq: Every day | ORAL | Status: DC
  Administered 2021-11-26 – 2021-11-28 (×3): 10 mg via ORAL
  Filled 2021-11-26 (×3): qty 1

## 2021-11-26 MED ORDER — ZOLPIDEM TARTRATE 5 MG PO TABS
5.0000 mg | ORAL_TABLET | Freq: Every day | ORAL | Status: DC
  Administered 2021-11-26: 23:00:00 5 mg via ORAL
  Filled 2021-11-26: qty 1

## 2021-11-26 NOTE — ED Triage Notes (Signed)
Had an SI Joint fusion on left side 2 weeks ago, having some episodes of shortness of breath, a lot of pain on left side of HA, has had nausea and vomiting last night, last vomiting episode was last PM.

## 2021-11-26 NOTE — ED Notes (Signed)
Pt transported to CT ?

## 2021-11-26 NOTE — ED Notes (Signed)
ED Provider at bedside. 

## 2021-11-26 NOTE — Progress Notes (Signed)
Received a phone call from Facility: [facility]  Requesting MD: Billy Fischer Patient with h/o anxiety/depression; HTN; thyroid cancer s/p thyroidectomy with hypothyroidism; and recent SI joint fusion presenting with n/v.  Needs admission for CP r/o.  Had SOB, n/v last night after recent SI joint fusion.  Periodic feelings of being unwell and pinprick sensations, has had evaluation for this.  ?chest pain.  Scanned C/A/P.  Troponin minimally elevated, no known h/o cardiac or renal disease.  Likely needs observation. Plan of care: The patient will be accepted for observation on telemetry at [facility] when bed is available.    Nursing staff, Please call the [place] number at the top of Amion at the time of the patient's arrival so that the patient can be paged to the admitting physician.   Carlyon Shadow, M.D. [facility]

## 2021-11-26 NOTE — ED Notes (Signed)
Pt family escorting pt to the bathroom for urine sample, still very wobbly and needing assistance to get their safely.

## 2021-11-26 NOTE — ED Notes (Signed)
Pt requesting more pain medications, will inform provider

## 2021-11-26 NOTE — H&P (Addendum)
History and Physical    Alyssa [name] UXN:[id number] DOB: 1951/10/28 DOA: 11/26/2021  PCP: Lequita Halt, MD (Confirm with patient/family/NH records and if not entered, this has to be entered at [facility] point of entry) Patient coming from: Home  I have personally briefly reviewed patient's old medical records in [place]  Chief Complaint: Chest pain  HPI: Alyssa [name] is a 70 y.o. female with medical history significant of HTN not on BP meds >1 year, anxiety depression, borderline hyponatremia, lumbar radiculopathy, recent SI joint fusion 2 weeks ago, chronic hyponatremia, presented with recurrent chest pains.  Symptoms started several days after her SI fusion surgery, she started to experience episodes of " tingling sensation in the face and neck, did not aching-like chest pains associated with nausea and shortness of breath" episode in last 20 to 30 minutes, no exacerbation factors but resolved by itself.  She does not remember any of this episode room related to activities.  She can get as many as 1-2 episodes a day however yesterday she experienced 3 such episodes and last night she had a severe episode which woke her up, and the chest pain was 7-8/10, and she could not sleep.  She felt nauseous and vomited x1 with stomach content.  Denied any cough, no fever or chills.  She used to be on blood pressure medications but 1 year ago " blood pressure normalized" and she stopped taking her blood pressure meds and she said she has been following up with PCP and her nephrologist and her blood pressure has been stable without ever needing to restart BP meds.  She has chronic left ankle arthritis and ambulation dysfunction, she does not feel she can walk on treadmill easily.  ED Course: Blood pressure borderline high, troponin 30> 40, EKG showed chronic nonspecific ST changes.  CT angiogram negative for PE.  Review of Systems: As per HPI otherwise 14 point review of systems negative.     Past Medical History:  Diagnosis Date   Anxiety and depression    Arthritis    Cervicogenic headache    Claustrophobia    Glaucoma    Hypertension    Lumbar radiculopathy    Postsurgical hypothyroidism    Spinal stenosis    Thyroid cancer [facility])    s/p thyroidectomy    Past Surgical History:  Procedure Laterality Date   FOOT SURGERY     LAMINOTOMY  04/03/2021   LEFT L3/4 LAMINOTOMY BILATERAL L3/4 MEDIAL FACETECTOMY   LUMBAR LAMINECTOMY  03/03/2018   THYROIDECTOMY  12/2004     reports that she has never smoked. She has never used smokeless tobacco. She reports current alcohol use. She reports that she does not use drugs.  Allergies  Allergen Reactions   Amoxicillin Hives    Family History  Problem Relation Age of Onset   Hypertension Sister    Thyroid cancer Brother    Skin cancer Brother    Diabetes Paternal Grandmother    Colon cancer Neg Hx    Breast cancer Neg Hx    Pancreatic cancer Neg Hx    Stomach cancer Neg Hx    Liver cancer Neg Hx    Esophageal cancer Neg Hx      Prior to Admission medications   Medication Sig Start Date End Date Taking? Authorizing Provider  acyclovir (ZOVIRAX) 400 MG tablet acyclovir 400 mg tablet  PRN    [provider]  DULoxetine (CYMBALTA) 20 MG capsule Take 20 mg by mouth daily.  [provider]  Estradiol 10 MCG TABS vaginal tablet Place 1 tablet (10 mcg total) vaginally 3 (three) times a week. 08/02/21   Donnamae Jude, MD  gabapentin (NEURONTIN) 300 MG capsule Take 300 mg by mouth 2 (two) times daily. 03/27/21   [provider]  influenza vaccine adjuvanted (FLUAD) 0.5 ML injection Inject into the muscle. 09/13/21   Carlyle Basques, MD  levothyroxine (SYNTHROID) 112 MCG tablet Take 1 tablet (112 mcg total) by mouth as directed. Two on sundays and 1 tablet the rest of the week 09/13/21   Shamleffer, Melanie Crazier, MD  timolol (TIMOPTIC) 0.5 % ophthalmic solution timolol maleate 0.5 % eye drops   once daily 08/15/17   [provider]  traMADol (ULTRAM) 50 MG tablet Take by mouth. 12/13/20   [provider]  TRAVOPROST OP Apply to eye.    [provider]  traZODone (DESYREL) 100 MG tablet Take 100 mg by mouth at bedtime. 09/07/21   [provider]    Physical Exam: Vitals:   11/26/21 1230 11/26/21 1400 11/26/21 1432 11/26/21 1531  BP: 139/75 134/83  (!) 146/74  Pulse: 72 73  65  Resp: 20 17  18   Temp:   98 F (36.7 C) 98.7 F (37.1 C)  TempSrc:   Oral Oral  SpO2: 100% 100%  98%  Weight:      Height:        Constitutional: NAD, calm, comfortable Vitals:   11/26/21 1230 11/26/21 1400 11/26/21 1432 11/26/21 1531  BP: 139/75 134/83  (!) 146/74  Pulse: 72 73  65  Resp: 20 17  18   Temp:   98 F (36.7 C) 98.7 F (37.1 C)  TempSrc:   Oral Oral  SpO2: 100% 100%  98%  Weight:      Height:       Eyes: PERRL, lids and conjunctivae normal ENMT: Mucous membranes are moist. Posterior pharynx clear of any exudate or lesions.Normal dentition.  Neck: normal, supple, no masses, no thyromegaly Respiratory: clear to auscultation bilaterally, no wheezing, no crackles. Normal respiratory effort. No accessory muscle use.  Cardiovascular: Regular rate and rhythm, no murmurs / rubs / gallops. No extremity edema. 2+ pedal pulses. No carotid bruits.  Abdomen: no tenderness, no masses palpated. No hepatosplenomegaly. Bowel sounds positive.  Musculoskeletal: no clubbing / cyanosis. No joint deformity upper and lower extremities. Good ROM, no contractures. Normal muscle tone.  Skin: no rashes, lesions, ulcers. No induration Neurologic: CN 2-12 grossly intact. Sensation intact, DTR normal. Strength 5/5 in all 4.  Psychiatric: Normal judgment and insight. Alert and oriented x 3. Normal mood.     Labs on Admission: I have personally reviewed following labs and imaging studies  CBC: Recent Labs  Lab 11/26/21 0833  WBC 5.2  NEUTROABS 3.3  HGB 13.6  HCT 38.2   MCV 87.4  PLT 709   Basic Metabolic Panel: Recent Labs  Lab 11/26/21 0833  NA 132*  K 3.6  CL 100  CO2 22  GLUCOSE 93  BUN 12  CREATININE 0.74  CALCIUM 9.3   GFR: Estimated Creatinine Clearance: 67.9 mL/min (by C-G formula based on SCr of 0.74 mg/dL). Liver Function Tests: Recent Labs  Lab 11/26/21 0833  AST 29  ALT 27  ALKPHOS 76  BILITOT 0.8  PROT 7.4  ALBUMIN 4.5   Recent Labs  Lab 11/26/21 0833  LIPASE 37   No results for input(s): AMMONIA in the last 168 hours. Coagulation Profile: No results for input(s):  INR, PROTIME in the last 168 hours. Cardiac Enzymes: No results for input(s): CKTOTAL, CKMB, CKMBINDEX, TROPONINI in the last 168 hours. BNP (last 3 results) No results for input(s): PROBNP in the last 8760 hours. HbA1C: No results for input(s): HGBA1C in the last 72 hours. CBG: No results for input(s): GLUCAP in the last 168 hours. Lipid Profile: No results for input(s): CHOL, HDL, LDLCALC, TRIG, CHOLHDL, LDLDIRECT in the last 72 hours. Thyroid Function Tests: No results for input(s): TSH, T4TOTAL, FREET4, T3FREE, THYROIDAB in the last 72 hours. Anemia Panel: No results for input(s): VITAMINB12, FOLATE, FERRITIN, TIBC, IRON, RETICCTPCT in the last 72 hours. Urine analysis:    Component Value Date/Time   COLORURINE YELLOW 11/26/2021 1057   APPEARANCEUR CLEAR 11/26/2021 1057   LABSPEC 1.010 11/26/2021 1057   PHURINE 7.5 11/26/2021 1057   GLUCOSEU NEGATIVE 11/26/2021 1057   HGBUR NEGATIVE 11/26/2021 1057   BILIRUBINUR NEGATIVE 11/26/2021 1057   BILIRUBINUR negative 09/25/2021 1534   KETONESUR 40 (A) 11/26/2021 1057   PROTEINUR NEGATIVE 11/26/2021 1057   UROBILINOGEN 0.2 09/25/2021 1534   NITRITE NEGATIVE 11/26/2021 1057   LEUKOCYTESUR NEGATIVE 11/26/2021 1057    Radiological Exams on Admission: CT Head Wo Contrast  Result Date: 11/26/2021 CLINICAL DATA:  Left-sided headache.  Nausea vomiting last night. EXAM: CT HEAD WITHOUT CONTRAST  TECHNIQUE: Contiguous axial images were obtained from the base of the skull through the vertex without intravenous contrast. COMPARISON:  10/24/2021 FINDINGS: Brain: No evidence of acute infarction, hemorrhage, hydrocephalus, extra-axial collection or mass lesion/mass effect. Vascular: No hyperdense vessel or unexpected calcification. Skull: Normal. Negative for fracture or focal lesion. Sinuses/Orbits: Globes and orbits are unremarkable. Visualized sinuses are clear. Other: None. IMPRESSION: Normal unenhanced CT scan of the brain. Electronically Signed   By: Lajean Manes M.D.   On: 11/26/2021 10:05   CT Angio Chest PE W and/or Wo Contrast  Result Date: 11/26/2021 CLINICAL DATA:  Shortness of breath, nausea and vomiting. Left-sided headache. EXAM: CT ANGIOGRAPHY CHEST WITH CONTRAST TECHNIQUE: Multidetector CT imaging of the chest was performed using the standard protocol during bolus administration of intravenous contrast. Multiplanar CT image reconstructions and MIPs were obtained to evaluate the vascular anatomy. CONTRAST:  150mL OMNIPAQUE IOHEXOL 350 MG/ML SOLN COMPARISON:  None. FINDINGS: Cardiovascular: Satisfactory opacification of the pulmonary arteries to the segmental level. No evidence of pulmonary embolism. Normal heart size. No pericardial effusion. Mediastinum/Nodes: No enlarged mediastinal, hilar, or axillary lymph nodes. Thyroid gland, trachea, and esophagus demonstrate no significant findings. Lungs/Pleura: Lungs are clear. No pleural effusion or pneumothorax. Upper Abdomen: No acute abnormality. Musculoskeletal: No chest wall abnormality. No acute or significant osseous findings. Mild dextroscoliosis of the midthoracic spine. Likely hemangioma in the T5 vertebral body. Review of the MIP images confirms the above findings. IMPRESSION: No evidence of pulmonary embolism or acute cardiopulmonary process. Electronically Signed   By: Keane Police D.O.   On: 11/26/2021 10:14   CT ABDOMEN PELVIS W  CONTRAST  Result Date: 11/26/2021 CLINICAL DATA:  Left lower quadrant abdominal pain. Nausea and vomiting. Evaluate for diverticulitis. Patient had SI joint effusion on the left 2 weeks ago. EXAM: CT ABDOMEN AND PELVIS WITH CONTRAST TECHNIQUE: Multidetector CT imaging of the abdomen and pelvis was performed using the standard protocol following bolus administration of intravenous contrast. CONTRAST:  159mL OMNIPAQUE IOHEXOL 350 MG/ML SOLN COMPARISON:  June 27, 2021 FINDINGS: Lower chest: No acute abnormality. Hepatobiliary: Multiple tiny low-attenuation lesions in the liver too small to characterize but likely cysts or small hemangiomas.  Portal vein is patent. The gallbladder is normal. Pancreas: Unremarkable. No pancreatic ductal dilatation or surrounding inflammatory changes. Spleen: Normal in size without focal abnormality. Adrenals/Urinary Tract: Adrenal glands are normal. A 3 mm stone is seen in the left kidney, unchanged. A probable tiny cyst is seen in the lower pole the right kidney, too small to completely characterize. No hydronephrosis or perinephric stranding. No ureterectasis or ureteral stone. The bladder is normal. Stomach/Bowel: Moderate to marked fecal loading throughout the colon. No colonic diverticuli are identified. No evidence of diverticulitis. The appendix is not visualized but there is no evidence of appendicitis. Vascular/Lymphatic: Mild calcified atherosclerosis in the nonaneurysmal abdominal aorta. No adenopathy. Reproductive: Uterus and bilateral adnexa are unremarkable. Other: No free air free fluid. The patient is status post recent left SI joint fusion. Hardware is in good position. Postprocedural changes are seen in the overlying soft tissues. Musculoskeletal: Status post recent left SI joint effusion. No other abnormalities. IMPRESSION: 1. Moderate to marked fecal loading throughout the colon. No diverticulosis or diverticulitis identified. 2. Multiple probable tiny cyst in the  liver, too small to characterize but unchanged. 3. 3 mm nonobstructive stone in the left kidney. 4. Probable tiny cyst in the right kidney, too small to characterize. 5. Mild calcified atherosclerosis in the nonaneurysmal aorta. 6. Recent left SI joint effusion. Electronically Signed   By: Dorise Bullion III M.D.   On: 11/26/2021 10:17    EKG: Independently reviewed.  Sinus rhythm, chronic nonspecific ST changes on multiple leads.  Assessment/Plan Principal Problem:   Elevated troponin Active Problems:   Chest pain  (please populate well all problems here in Problem List. (For example, if patient is on BP meds at home and you resume or decide to hold them, it is a problem that needs to be her. Same for CAD, COPD, HLD and so on)  Atypical chest pain with borderline elevation of troponins -Calculated 10 year risk of heart disease 8.6%.  Given other risk factors and the untreated HLD (LDL 130 08/2021), will order a chemical stress test given that patient has poor left ankle joint and unlikely can tolerate treadmill. -Continue ASA 81 daily -Start low-dose of atorvastatin -Echo  HTN not on medications -24 hours tele monitoring  -Echo to evaluate LVH  Hyponatremia -Sodium level stable, outpatient nephrology follow-up.  Hypothyroidism -Continue Synthroid.  Recent SI fusion surgery -No acute issue, pain control.  Anxiety/depression -Continue SSRI.  DVT prophylaxis: Lovenox Code Status: Full code Family Communication: Husband at bedside Disposition Plan: Expect less than 2 midnight hospital stay Consults called: None Admission status: Telemetry observation   Lequita Halt MD [facility] Pager [phone]  11/26/2021, 4:24 PM

## 2021-11-26 NOTE — ED Provider Notes (Signed)
[facility] Provider Note   CSN: [id number] Arrival date & time: 11/26/21  0734     History Chief Complaint  Patient presents with   Nausea    Alyssa [name] is a 70 y.o. female.  HPI    70yo female with history of left sacroiliac joint fusion 11/30 due to left greater than right bilateral SI dysfunction, intractable pain, LLE radiculopathy, also with history of hypertension, hypothyroidism, thyroid cancer status post thyroidectomy, depressoin, anxiety, who presents with shortness of breath, nausea, vomiting, headache.  Has felt unhealthy since a bug bites 1.5 years ago, has seen several doctors and has not felt that she has been taken seriously. Has not felt like herself, is concerned she may have a fungal infection and is immunocompromised.  These ongoing symptoms including episodes of pin pricking throughout her body, with tingling of face, around mouth.  Has had bad histamine reaction to ant bites, and this feels similar, having sudden prickling feeling all over, last night had tingling in teeth, stabbing in eyes, pinpricks on hands  Shortness of breath and feels cold like going to faint Skin feels hypersensitive  Dyspnea started in the last week No chest pain, but then has feeling of pressure with deep breaths  When other symptoms triggered it is like suddenly can't take a deep breath Sometimes headache which triggers nausea as well. Sent family doctor a message but hadn't heard back so came in today.  Dyspnea with prickling all over, tingling in teeth Continuing to have gluteal pain since the surgery No new leg pain or swelling. Has been on a lot of cortisone and antibiotics over the last year, feels the steroids make things worse  Persistent nausea but last night major bout of vomiting, one hour after eating.  Feels like histamine type reaction, like body overwhelmed with something Nausea was minor before but worse in the last week Been  having abnormal bowel movements for several months, on abx, added probiotic which helps, does feel better today  No urinary symptoms, last few days bowel movements seem normal, no cough, no sore throat or runny nose, tingling beside nose Chills no fevers Immunocompromised   Past Medical History:  Diagnosis Date   Anxiety and depression    Arthritis    Cervicogenic headache    Claustrophobia    Glaucoma    Hypertension    Lumbar radiculopathy    Postsurgical hypothyroidism    Spinal stenosis    Thyroid cancer [facility])    s/p thyroidectomy    Patient Active Problem List   Diagnosis Date Noted   Elevated troponin 11/26/2021   Vaginal dryness 08/02/2021   Gastritis 06/17/2021   Constipation 06/17/2021   Nausea and vomiting 06/17/2021   Hyponatremia 06/16/2021   PCP NOTES >>>>>>>>>>> 06/06/2021   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   Spinal stenosis, lumbar region, 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   Status post left foot surgery 05/15/2021   Anxiety and depression 04/26/2021   Cervicogenic headache 04/26/2021   Claustrophobia 04/26/2021   History of lumbar laminectomy 10/31/2020   Other specified glaucoma 10/31/2020   Left lumbar radiculopathy 06/21/2020   Malignant neoplasm of thyroid gland ([place]) 11/03/2019   Postoperative hypothyroidism 11/03/2019   Essential hypertension 12/11/2017    Past Surgical History:  Procedure Laterality Date   FOOT SURGERY     LAMINOTOMY  04/03/2021   LEFT L3/4 LAMINOTOMY BILATERAL L3/4 MEDIAL FACETECTOMY   LUMBAR LAMINECTOMY  03/03/2018   THYROIDECTOMY  12/2004  OB History     Gravida  3   Para      Term      Preterm      AB  3   Living         SAB      IAB  3   Ectopic      Multiple      Live Births              Family History  Problem Relation Age of Onset   Hypertension Sister    Thyroid cancer Brother    Skin cancer Brother    Diabetes Paternal Grandmother    Colon cancer Neg Hx     Breast cancer Neg Hx    Pancreatic cancer Neg Hx    Stomach cancer Neg Hx    Liver cancer Neg Hx    Esophageal cancer Neg Hx     Social History   Tobacco Use   Smoking status: Never   Smokeless tobacco: Never  Vaping Use   Vaping Use: Never used  Substance Use Topics   Alcohol use: Yes    Comment: Rare   Drug use: Never    Home Medications Prior to Admission medications   Medication Sig Start Date End Date Taking? Authorizing Provider  acyclovir (ZOVIRAX) 400 MG tablet acyclovir 400 mg tablet  PRN    [provider]  DULoxetine (CYMBALTA) 20 MG capsule Take 20 mg by mouth daily.    [provider]  Estradiol 10 MCG TABS vaginal tablet Place 1 tablet (10 mcg total) vaginally 3 (three) times a week. 08/02/21   Donnamae Jude, MD  gabapentin (NEURONTIN) 300 MG capsule Take 300 mg by mouth 2 (two) times daily. 03/27/21   [provider]  influenza vaccine adjuvanted (FLUAD) 0.5 ML injection Inject into the muscle. 09/13/21   Carlyle Basques, MD  levothyroxine (SYNTHROID) 112 MCG tablet Take 1 tablet (112 mcg total) by mouth as directed. Two on sundays and 1 tablet the rest of the week 09/13/21   Shamleffer, Melanie Crazier, MD  timolol (TIMOPTIC) 0.5 % ophthalmic solution timolol maleate 0.5 % eye drops  once daily 08/15/17   [provider]  traMADol (ULTRAM) 50 MG tablet Take by mouth. 12/13/20   [provider]  TRAVOPROST OP Apply to eye.    [provider]  traZODone (DESYREL) 100 MG tablet Take 100 mg by mouth at bedtime. 09/07/21   [provider]    Allergies    Amoxicillin  Review of Systems   Review of Systems  Constitutional:  Positive for fatigue. Negative for fever.  HENT:  Negative for sore throat.   Eyes:  Negative for visual disturbance.  Respiratory:  Positive for shortness of breath. Negative for cough.   Cardiovascular:  Positive for chest pain.  Gastrointestinal:  Positive for abdominal pain, nausea  and vomiting.  Genitourinary:  Negative for difficulty urinating.  Musculoskeletal:  Positive for arthralgias and back pain. Negative for neck pain.  Skin:  Negative for rash.  Neurological:  Positive for light-headedness and headaches. Negative for dizziness, syncope, weakness and numbness.   Physical Exam Updated Vital Signs BP 139/75    Pulse 72    Temp 97.9 F (36.6 C) (Oral)    Resp 20    Ht 5\' 8"  (1.727 m)    Wt 74.4 kg    SpO2 100%    BMI 24.94 kg/m   Physical Exam Vitals and nursing note reviewed.  Constitutional:      General: She is not in acute distress.    Appearance: She is well-developed. She is not diaphoretic.  HENT:     Head: Normocephalic and atraumatic.  Eyes:     Conjunctiva/sclera: Conjunctivae normal.  Cardiovascular:     Rate and Rhythm: Normal rate and regular rhythm.     Heart sounds: Normal heart sounds. No murmur heard.   No friction rub. No gallop.  Pulmonary:     Effort: Pulmonary effort is normal. No respiratory distress.     Breath sounds: Normal breath sounds. No wheezing or rales.  Abdominal:     General: There is no distension.     Palpations: Abdomen is soft.     Tenderness: There is no abdominal tenderness. There is no guarding.  Musculoskeletal:        General: No tenderness.     Cervical back: Normal range of motion.  Skin:    General: Skin is warm and dry.     Findings: No erythema or rash.     Comments: Incisions clean, dry intact, no surrounding erythema   Neurological:     Mental Status: She is alert and oriented to person, place, and time.     GCS: GCS eye subscore is 4. GCS verbal subscore is 5. GCS motor subscore is 6.    ED Results / Procedures / Treatments   Labs (all labs ordered are listed, but only abnormal results are displayed) Labs Reviewed  CBC WITH DIFFERENTIAL/PLATELET - Abnormal; Notable for the following components:      Result Value   Basophils Absolute 0.2 (*)    All other components within normal limits   COMPREHENSIVE METABOLIC PANEL - Abnormal; Notable for the following components:   Sodium 132 (*)    All other components within normal limits  URINALYSIS, ROUTINE W REFLEX MICROSCOPIC - Abnormal; Notable for the following components:   Ketones, ur 40 (*)    All other components within normal limits  TROPONIN I (HIGH SENSITIVITY) - Abnormal; Notable for the following components:   Troponin I (High Sensitivity) 34 (*)    All other components within normal limits  TROPONIN I (HIGH SENSITIVITY) - Abnormal; Notable for the following components:   Troponin I (High Sensitivity) 40 (*)    All other components within normal limits  RESP PANEL BY RT-PCR (FLU A&B, COVID) ARPGX2  LIPASE, BLOOD  BRAIN NATRIURETIC PEPTIDE    EKG EKG Interpretation  Date/Time:  Sunday November 26 2021 08:41:40 EST Ventricular Rate:  84 PR Interval:  163 QRS Duration: 101 QT Interval:  392 QTC Calculation: 464 R Axis:   69 Text Interpretation: Sinus rhythm Left atrial enlargement Nonspecific T abnormalities, anterior leads TW abnormalities in anterior leads new in comparison to prior Confirmed by Gareth Morgan [phone]) on 11/26/2021 10:44:51 AM  Radiology CT Head Wo Contrast  Result Date: 11/26/2021 CLINICAL DATA:  Left-sided headache.  Nausea vomiting last night. EXAM: CT HEAD WITHOUT CONTRAST TECHNIQUE: Contiguous axial images were obtained from the base of the skull through the vertex without intravenous contrast. COMPARISON:  10/24/2021 FINDINGS: Brain: No evidence of acute infarction, hemorrhage, hydrocephalus, extra-axial collection or mass lesion/mass effect. Vascular: No hyperdense vessel or unexpected calcification. Skull: Normal. Negative for fracture or focal lesion. Sinuses/Orbits: Globes and orbits are unremarkable. Visualized sinuses are clear. Other: None. IMPRESSION: Normal unenhanced CT scan of the brain. Electronically Signed   By: Lajean Manes M.D.   On: 11/26/2021 10:05   CT Angio Chest PE  W and/or Wo Contrast  Result Date: 11/26/2021 CLINICAL DATA:  Shortness of breath, nausea and vomiting. Left-sided headache. EXAM: CT ANGIOGRAPHY CHEST WITH CONTRAST TECHNIQUE: Multidetector CT imaging of the chest was performed using the standard protocol during bolus administration of intravenous contrast. Multiplanar CT image reconstructions and MIPs were obtained to evaluate the vascular anatomy. CONTRAST:  122mL OMNIPAQUE IOHEXOL 350 MG/ML SOLN COMPARISON:  None. FINDINGS: Cardiovascular: Satisfactory opacification of the pulmonary arteries to the segmental level. No evidence of pulmonary embolism. Normal heart size. No pericardial effusion. Mediastinum/Nodes: No enlarged mediastinal, hilar, or axillary lymph nodes. Thyroid gland, trachea, and esophagus demonstrate no significant findings. Lungs/Pleura: Lungs are clear. No pleural effusion or pneumothorax. Upper Abdomen: No acute abnormality. Musculoskeletal: No chest wall abnormality. No acute or significant osseous findings. Mild dextroscoliosis of the midthoracic spine. Likely hemangioma in the T5 vertebral body. Review of the MIP images confirms the above findings. IMPRESSION: No evidence of pulmonary embolism or acute cardiopulmonary process. Electronically Signed   By: Keane Police D.O.   On: 11/26/2021 10:14   CT ABDOMEN PELVIS W CONTRAST  Result Date: 11/26/2021 CLINICAL DATA:  Left lower quadrant abdominal pain. Nausea and vomiting. Evaluate for diverticulitis. Patient had SI joint effusion on the left 2 weeks ago. EXAM: CT ABDOMEN AND PELVIS WITH CONTRAST TECHNIQUE: Multidetector CT imaging of the abdomen and pelvis was performed using the standard protocol following bolus administration of intravenous contrast. CONTRAST:  168mL OMNIPAQUE IOHEXOL 350 MG/ML SOLN COMPARISON:  June 27, 2021 FINDINGS: Lower chest: No acute abnormality. Hepatobiliary: Multiple tiny low-attenuation lesions in the liver too small to characterize but likely cysts or  small hemangiomas. Portal vein is patent. The gallbladder is normal. Pancreas: Unremarkable. No pancreatic ductal dilatation or surrounding inflammatory changes. Spleen: Normal in size without focal abnormality. Adrenals/Urinary Tract: Adrenal glands are normal. A 3 mm stone is seen in the left kidney, unchanged. A probable tiny cyst is seen in the lower pole the right kidney, too small to completely characterize. No hydronephrosis or perinephric stranding. No ureterectasis or ureteral stone. The bladder is normal. Stomach/Bowel: Moderate to marked fecal loading throughout the colon. No colonic diverticuli are identified. No evidence of diverticulitis. The appendix is not visualized but there is no evidence of appendicitis. Vascular/Lymphatic: Mild calcified atherosclerosis in the nonaneurysmal abdominal aorta. No adenopathy. Reproductive: Uterus and bilateral adnexa are unremarkable. Other: No free air free fluid. The patient is status post recent left SI joint fusion. Hardware is in good position. Postprocedural changes are seen in the overlying soft tissues. Musculoskeletal: Status post recent left SI joint effusion. No other abnormalities. IMPRESSION: 1. Moderate to marked fecal loading throughout the colon. No diverticulosis or diverticulitis identified. 2. Multiple probable tiny cyst in the liver, too small to characterize but unchanged. 3. 3 mm nonobstructive stone in the left kidney. 4. Probable tiny cyst in the right kidney, too small to characterize. 5. Mild calcified atherosclerosis in the nonaneurysmal aorta. 6. Recent left SI joint effusion. Electronically Signed   By: Dorise Bullion III M.D.   On: 11/26/2021 10:17    Procedures Procedures   Medications Ordered in ED Medications  lactated ringers bolus 1,000 mL (0 mLs Intravenous Stopped 11/26/21 1020)  aspirin chewable tablet 324 mg (324 mg Oral Given 11/26/21 0955)  morphine 4 MG/ML injection 4 mg (4 mg Intravenous Given 11/26/21 0955)   iohexol (OMNIPAQUE) 350 MG/ML injection 100 mL (100 mLs Intravenous Contrast Given 11/26/21 0937)  HYDROcodone-acetaminophen (NORCO/VICODIN) 5-325 MG per tablet 1 tablet (1 tablet  Oral Given 11/26/21 1352)    ED Course  I have reviewed the triage vital signs and the nursing notes.  Pertinent labs & imaging results that were available during my care of the patient were reviewed by me and considered in my medical decision making (see chart for details).    MDM Rules/Calculators/A&P                          70yo female with history of left sacroiliac joint fusion 11/30 due to left greater than right bilateral SI dysfunction, intractable pain, LLE radiculopathy, also with history of hypertension, hypothyroidism, thyroid cancer status post thyroidectomy, depressoin, anxiety, who presents with shortness of breath, nausea, vomiting, headache over the last week with background of ongoing health dysfunction for 1.5 years for which she has not found a clear explanation as an outpatient.  Given headache with nausea, vomiting, ordered CT head to evaluate which shows no acute abnormalities.  Given recent surgery, dyspnea, tachycardia, ordered CT PE study which showed no sign of pulmonary embolus, pneumonia, or other acute abnormalities..  Given abdominal pain, tenderness, nausea and vomiting CT abdomen pelvis ordered and shows moderate to marked fecal loading throughout the colon, other incidental probable tiny cysts in the liver, kidney.  Labs show no evidence of pancreatitis.  History of prior hyponatremia, sodium levels today 132. No anemia or electrolyte abnormalities.  Troponin elevated to 34 and 40 with no prior history of known heart disease, no renal abnormality. Unclear if this is etiology of dyspnea, nausea, vomiting, potentially teeth tingling.  Will plan on admission for further cardiac evaluation.        Final Clinical Impression(s) / ED Diagnoses Final diagnoses:  Elevated troponin   Nausea and vomiting, unspecified vomiting type  Dyspnea, unspecified type  Other chronic pain    Rx / DC Orders ED Discharge Orders     None        Gareth Morgan, MD 11/26/21 1420

## 2021-11-27 ENCOUNTER — Observation Stay (HOSPITAL_COMMUNITY): Payer: Medicare Other

## 2021-11-27 DIAGNOSIS — Z880 Allergy status to penicillin: Secondary | ICD-10-CM | POA: Diagnosis not present

## 2021-11-27 DIAGNOSIS — R079 Chest pain, unspecified: Secondary | ICD-10-CM | POA: Diagnosis not present

## 2021-11-27 DIAGNOSIS — Z8249 Family history of ischemic heart disease and other diseases of the circulatory system: Secondary | ICD-10-CM | POA: Diagnosis not present

## 2021-11-27 DIAGNOSIS — X501XXA Overexertion from prolonged static or awkward postures, initial encounter: Secondary | ICD-10-CM | POA: Diagnosis not present

## 2021-11-27 DIAGNOSIS — R0600 Dyspnea, unspecified: Secondary | ICD-10-CM

## 2021-11-27 DIAGNOSIS — M19072 Primary osteoarthritis, left ankle and foot: Secondary | ICD-10-CM | POA: Diagnosis present

## 2021-11-27 DIAGNOSIS — Z833 Family history of diabetes mellitus: Secondary | ICD-10-CM | POA: Diagnosis not present

## 2021-11-27 DIAGNOSIS — G629 Polyneuropathy, unspecified: Secondary | ICD-10-CM | POA: Diagnosis present

## 2021-11-27 DIAGNOSIS — M791 Myalgia, unspecified site: Secondary | ICD-10-CM | POA: Diagnosis present

## 2021-11-27 DIAGNOSIS — M2550 Pain in unspecified joint: Secondary | ICD-10-CM | POA: Diagnosis present

## 2021-11-27 DIAGNOSIS — Y929 Unspecified place or not applicable: Secondary | ICD-10-CM | POA: Diagnosis not present

## 2021-11-27 DIAGNOSIS — E039 Hypothyroidism, unspecified: Secondary | ICD-10-CM | POA: Diagnosis present

## 2021-11-27 DIAGNOSIS — R778 Other specified abnormalities of plasma proteins: Secondary | ICD-10-CM | POA: Diagnosis not present

## 2021-11-27 DIAGNOSIS — I10 Essential (primary) hypertension: Secondary | ICD-10-CM | POA: Diagnosis present

## 2021-11-27 DIAGNOSIS — R112 Nausea with vomiting, unspecified: Secondary | ICD-10-CM | POA: Diagnosis present

## 2021-11-27 DIAGNOSIS — G8929 Other chronic pain: Secondary | ICD-10-CM

## 2021-11-27 DIAGNOSIS — E860 Dehydration: Secondary | ICD-10-CM | POA: Diagnosis present

## 2021-11-27 DIAGNOSIS — I700 Atherosclerosis of aorta: Secondary | ICD-10-CM | POA: Diagnosis not present

## 2021-11-27 DIAGNOSIS — E785 Hyperlipidemia, unspecified: Secondary | ICD-10-CM | POA: Diagnosis present

## 2021-11-27 DIAGNOSIS — Z981 Arthrodesis status: Secondary | ICD-10-CM | POA: Diagnosis not present

## 2021-11-27 DIAGNOSIS — I200 Unstable angina: Secondary | ICD-10-CM

## 2021-11-27 DIAGNOSIS — E871 Hypo-osmolality and hyponatremia: Secondary | ICD-10-CM | POA: Diagnosis present

## 2021-11-27 DIAGNOSIS — Z79899 Other long term (current) drug therapy: Secondary | ICD-10-CM | POA: Diagnosis not present

## 2021-11-27 DIAGNOSIS — F32A Depression, unspecified: Secondary | ICD-10-CM | POA: Diagnosis present

## 2021-11-27 DIAGNOSIS — R0789 Other chest pain: Secondary | ICD-10-CM | POA: Diagnosis present

## 2021-11-27 DIAGNOSIS — M461 Sacroiliitis, not elsewhere classified: Secondary | ICD-10-CM | POA: Diagnosis present

## 2021-11-27 DIAGNOSIS — K5900 Constipation, unspecified: Secondary | ICD-10-CM | POA: Diagnosis present

## 2021-11-27 DIAGNOSIS — Z85850 Personal history of malignant neoplasm of thyroid: Secondary | ICD-10-CM | POA: Diagnosis not present

## 2021-11-27 DIAGNOSIS — Z808 Family history of malignant neoplasm of other organs or systems: Secondary | ICD-10-CM | POA: Diagnosis not present

## 2021-11-27 DIAGNOSIS — Z79890 Hormone replacement therapy (postmenopausal): Secondary | ICD-10-CM | POA: Diagnosis not present

## 2021-11-27 DIAGNOSIS — F40240 Claustrophobia: Secondary | ICD-10-CM | POA: Diagnosis present

## 2021-11-27 DIAGNOSIS — Z20822 Contact with and (suspected) exposure to covid-19: Secondary | ICD-10-CM | POA: Diagnosis present

## 2021-11-27 LAB — ECHOCARDIOGRAM COMPLETE
Area-P 1/2: 3.48 cm2
Calc EF: 62.9 %
Height: 68 in
Radius: 0.3 cm
S' Lateral: 3.3 cm
Single Plane A2C EF: 66.4 %
Single Plane A4C EF: 58.6 %
Weight: 2580.8 oz

## 2021-11-27 LAB — LIPID PANEL
Cholesterol: 199 mg/dL (ref 0–200)
HDL: 68 mg/dL (ref >40)
LDL Cholesterol: 125 mg/dL — ABNORMAL HIGH (ref 0–99)
Total CHOL/HDL Ratio: 2.9 RATIO
Triglycerides: 30 mg/dL (ref <150)
VLDL: 6 mg/dL (ref 0–40)

## 2021-11-27 LAB — TROPONIN I (HIGH SENSITIVITY): Troponin I (High Sensitivity): 86 ng/L — ABNORMAL HIGH (ref <18)

## 2021-11-27 LAB — CK: Total CK: 85 U/L (ref 38–234)

## 2021-11-27 LAB — FOLATE: Folate: 43.3 ng/mL (ref >5.9)

## 2021-11-27 LAB — VITAMIN B12: Vitamin B-12: 1344 pg/mL — ABNORMAL HIGH (ref 180–914)

## 2021-11-27 LAB — D-DIMER, QUANTITATIVE: D-Dimer, Quant: 0.4 ug/mL-FEU (ref 0.00–0.50)

## 2021-11-27 MED ORDER — OXYCODONE-ACETAMINOPHEN 7.5-325 MG PO TABS: 1.0000 | ORAL_TABLET | ORAL | Status: DC | PRN

## 2021-11-27 MED ORDER — ESCITALOPRAM OXALATE 10 MG PO TABS
10.0000 mg | ORAL_TABLET | Freq: Every morning | ORAL | Status: DC
  Administered 2021-11-28: 10:00:00 10 mg via ORAL
  Filled 2021-11-27: qty 1

## 2021-11-27 MED ORDER — MAGNESIUM HYDROXIDE 400 MG/5ML PO SUSP
30.0000 mL | Freq: Every day | ORAL | Status: DC | PRN
  Administered 2021-11-27: 05:00:00 30 mL via ORAL
  Filled 2021-11-27: qty 30

## 2021-11-27 MED ORDER — OXYCODONE HCL 5 MG PO TABS
2.5000 mg | ORAL_TABLET | ORAL | Status: DC | PRN
  Administered 2021-11-27 – 2021-11-28 (×2): 2.5 mg via ORAL
  Filled 2021-11-27 (×2): qty 1

## 2021-11-27 MED ORDER — DIPHENHYDRAMINE HCL 25 MG PO CAPS
25.0000 mg | ORAL_CAPSULE | Freq: Four times a day (QID) | ORAL | Status: DC | PRN
  Administered 2021-11-27 (×2): 25 mg via ORAL
  Filled 2021-11-27 (×2): qty 1

## 2021-11-27 MED ORDER — LEVOTHYROXINE SODIUM 112 MCG PO TABS
112.0000 ug | ORAL_TABLET | ORAL | Status: DC
  Administered 2021-11-28: 07:00:00 112 ug via ORAL
  Filled 2021-11-27: qty 1

## 2021-11-27 MED ORDER — TECHNETIUM TC 99M TETROFOSMIN IV KIT
10.0000 | PACK | Freq: Once | INTRAVENOUS | Status: AC | PRN
  Administered 2021-11-27: 09:00:00 10 via INTRAVENOUS

## 2021-11-27 MED ORDER — HYDROXYZINE HCL 25 MG PO TABS
25.0000 mg | ORAL_TABLET | Freq: Three times a day (TID) | ORAL | Status: DC | PRN
  Administered 2021-11-27: 21:00:00 25 mg via ORAL
  Filled 2021-11-27: qty 1

## 2021-11-27 MED ORDER — GABAPENTIN 300 MG PO CAPS
300.0000 mg | ORAL_CAPSULE | Freq: Three times a day (TID) | ORAL | Status: DC
  Administered 2021-11-27 – 2021-11-28 (×3): 300 mg via ORAL
  Filled 2021-11-27 (×3): qty 1

## 2021-11-27 MED ORDER — OXYCODONE-ACETAMINOPHEN 5-325 MG PO TABS
1.0000 | ORAL_TABLET | ORAL | Status: DC | PRN
  Administered 2021-11-27 – 2021-11-28 (×2): 1 via ORAL
  Filled 2021-11-27 (×2): qty 1

## 2021-11-27 MED ORDER — SODIUM CHLORIDE 0.9 % IV SOLN: INTRAVENOUS | Status: AC

## 2021-11-27 MED ORDER — LEVOTHYROXINE SODIUM 112 MCG PO TABS: 168.0000 ug | ORAL_TABLET | ORAL | Status: DC

## 2021-11-27 MED ORDER — DULOXETINE HCL 30 MG PO CPEP
30.0000 mg | ORAL_CAPSULE | Freq: Every day | ORAL | Status: DC
  Administered 2021-11-27: 12:00:00 30 mg via ORAL
  Filled 2021-11-27: qty 1

## 2021-11-27 MED ORDER — MORPHINE SULFATE (PF) 2 MG/ML IV SOLN
0.5000 mg | INTRAVENOUS | Status: DC | PRN
Start: 2021-11-27 — End: 2021-11-28
  Administered 2021-11-27: 15:00:00 0.5 mg via INTRAVENOUS
  Filled 2021-11-27: qty 1

## 2021-11-27 NOTE — Care Management Obs Status (Signed)
[facility] NOTIFICATION   Patient Details  Name: TURNER KUNZMAN MRN: [id number] Date of Birth: 05/12/1951   Medicare Observation Status Notification Given:  Yes    Bartholomew Crews, RN 11/27/2021, 3:29 PM

## 2021-11-27 NOTE — Progress Notes (Addendum)
Per patient, she became confused and passed out while waiting for stress test. Patient informed nuclear med tech and Tech was not comfortable doing the stress test due to the patient's report. None of the symptoms visualized by staff. Patient again requesting morphine by name because the oral pain medication was not working and she is in agony. Notified MD.

## 2021-11-27 NOTE — Consult Note (Addendum)
Cardiology Consultation:   Patient ID: CAMRY ROBELLO MRN: [id number]; DOB: 1951/11/30  Admit date: 11/26/2021 Date of Consult: 11/27/2021  PCP:  Lequita Halt, MD   [facility] Providers Cardiologist:  None new  Patient Profile:   JANYAH SINGLETERRY is a 70 y.o. female with a hx of anxiety and depression, thyroid CA s/p thyroidectomy, spinal stenosis, claustrophobia, who is being seen 11/27/2021 for the evaluation of chest pain at the request of Dr Tana Coast.  History of Present Illness:   Ms. Coin was admitted 12/18 with chest pain, was to have a Myoview today. Per nursing report, pt became confused and passed out while waiting for stress test. Cards asked to see.   She had a heart MD in [place] and had a GXT stress test that was ok about 5 years.   She came in because of recurrent symptoms.  She gets an itchy red rash on her neck, gets tingling throughout her body. The ball of her foot gets more pink, she gets hot and sweats.   She gets an achy feeling in her heart, 7/10, hard to breath, also feels cold. The sx come in waves.   The sx started 4-5 months ago. They are worse at home, not as frequent here. Sx have come 1 hr after eating, but do not regularly, at that time.    Also had N&V when she came to the hospital. That is why she came to the ER >> [facility]. Since 7:30 am yesterday, she has had about 5 episodes, fewer than she was having at [facility]. Benadryl helps a little.   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 She had to get epi shots after the ant bites.   She had [place] central nervous system [place], but the symptoms are different.   On 11/30, she had an SI joint fusion. She did well for a week, then pain got worse, exacerbated by her other episodes of the symptoms above  Her husband stated that he saw some lesions around the incisions on her back, but did not photograph those  and they have resolved.  She has taken photographs of some of the other lesions, and has those in her phone.   Past Medical History:  Diagnosis Date   Anxiety and depression    Arthritis    Cervicogenic headache    Claustrophobia    Glaucoma    Hypertension    Lumbar radiculopathy    Postsurgical hypothyroidism    Spinal stenosis    Thyroid cancer [facility])    s/p thyroidectomy    Past Surgical History:  Procedure Laterality Date   FOOT SURGERY     LAMINOTOMY  04/03/2021   LEFT L3/4 LAMINOTOMY BILATERAL L3/4 MEDIAL FACETECTOMY   LUMBAR LAMINECTOMY  03/03/2018   THYROIDECTOMY  12/2004     Home Medications:  Prior to Admission medications   Medication Sig Start Date End Date Taking? Authorizing Provider  acetaminophen (TYLENOL) 325 MG tablet Take 325-650 mg by mouth every 6 (six) hours as needed for headache (pain).   Yes [provider]  Ascorbic Acid (VITAMIN C) 1000 MG tablet Take 1,000 mg by mouth daily.   Yes [provider]  Cholecalciferol (VITAMIN D-3) 125 MCG (5000 UT) TABS Take 5,000 Units by mouth daily.   Yes [provider]  Cyanocobalamin (VITAMIN B-12 PO) Take 1 tablet by mouth  daily.   Yes [provider]  fluticasone (FLONASE) 50 MCG/ACT nasal spray Place 1 spray into both nostrils at bedtime as needed for allergies or rhinitis.   Yes [provider]  hydrocortisone cream 1 % Apply 1 application topically daily as needed for itching.   Yes [provider]  L-Methylfolate-Algae-B12-B6 (METANX PO) Take 1-2 capsules by mouth daily.   Yes [provider]  naproxen sodium (ALEVE) 220 MG tablet Take 220 mg by mouth 2 (two) times daily as needed (pain/headache).   Yes [provider]  pyridOXINE (VITAMIN B-6) 100 MG tablet Take 100 mg by mouth daily.   Yes [provider]  Travoprost, BAK Free, (TRAVATAN) 0.004 % SOLN ophthalmic solution 1 drop at bedtime.   Yes [provider]   vitamin E 180 MG (400 UNITS) capsule Take 400 Units by mouth daily.   Yes [provider]  DULoxetine (CYMBALTA) 30 MG capsule Take 30 mg by mouth daily. 09/15/21   [provider]  escitalopram (LEXAPRO) 10 MG tablet Take 10 mg by mouth daily. 11/11/21   [provider]  Estradiol 10 MCG TABS vaginal tablet Place 1 tablet (10 mcg total) vaginally 3 (three) times a week. 08/02/21   Donnamae Jude, MD  gabapentin (NEURONTIN) 300 MG capsule Take 300 mg by mouth 2 (two) times daily. 03/27/21   [provider]  influenza vaccine adjuvanted (FLUAD) 0.5 ML injection Inject into the muscle. Patient not taking: Reported on 11/27/2021 09/13/21   Carlyle Basques, MD  levothyroxine (SYNTHROID) 112 MCG tablet Take 1 tablet (112 mcg total) by mouth as directed. Two on sundays and 1 tablet the rest of the week 09/13/21   Shamleffer, Melanie Crazier, MD  methocarbamol (ROBAXIN) 500 MG tablet Take 500 mg by mouth 3 (three) times daily as needed for muscle spasms. 11/09/21   [provider]  pregabalin (LYRICA) 75 MG capsule Take 75 mg by mouth 2 (two) times daily. 10/25/21   [provider]  timolol (TIMOPTIC) 0.5 % ophthalmic solution timolol maleate 0.5 % eye drops  once daily 08/15/17   [provider]  traMADol (ULTRAM) 50 MG tablet Take by mouth. 12/13/20   [provider]  traZODone (DESYREL) 100 MG tablet Take 100 mg by mouth at bedtime. 09/07/21   [provider]  valACYclovir (VALTREX) 500 MG tablet Take 500 mg by mouth daily. 11/21/21   [provider]    Inpatient Medications: Scheduled Meds:  aspirin EC  81 mg Oral Daily   atorvastatin  10 mg Oral Daily   DULoxetine  30 mg Oral Daily   enoxaparin (LOVENOX) injection  40 mg Subcutaneous Q24H   estradiol   Vaginal Q M,W,F   gabapentin  300 mg Oral BID   [START ON 11/28/2021] levothyroxine  112 mcg Oral Once per day on Mon Tue Wed Thu Fri Sat   And   [START ON  12/03/2021] levothyroxine  168 mcg Oral Once per day on Sun   zolpidem  5 mg Oral QHS   Continuous Infusions:  sodium chloride 100 mL/hr at 11/27/21 1215   PRN Meds: acetaminophen, hydrALAZINE, hydrOXYzine, ketorolac, magnesium hydroxide, morphine injection, ondansetron (ZOFRAN) IV, oxyCODONE, traMADol  Allergies:    Allergies  Allergen Reactions   Amoxicillin Hives    Social History:   Social History   Socioeconomic History   Marital status: Married    Spouse name: Not on file   Number of children: 0   Years of education:  Not on file   Highest education level: Master's degree (e.g., MA, MS, MEng, MEd, MSW, MBA)  Occupational History   Occupation: retired, Pharmacologist  Tobacco Use   Smoking status: Never   Smokeless tobacco: Never  Vaping Use   Vaping Use: Never used  Substance and Sexual Activity   Alcohol use: Yes    Comment: Rare   Drug use: Never   Sexual activity: Not Currently  Other Topics Concern   Not on file  Social History Narrative   Moved from [place] June 2022   Lives w/ husband   Moved to stay close to her sister (Mrs Terance Hart)   Social Determinants of Health   Financial Resource Strain: Not on file  Food Insecurity: Not on file  Transportation Needs: Not on file  Physical Activity: Not on file  Stress: Not on file  Social Connections: Not on file  Intimate Partner Violence: Not on file    Family History:   Family History  Problem Relation Age of Onset   Hypertension Sister    Thyroid cancer Brother    Skin cancer Brother    Diabetes Paternal Grandmother    Colon cancer Neg Hx    Breast cancer Neg Hx    Pancreatic cancer Neg Hx    Stomach cancer Neg Hx    Liver cancer Neg Hx    Esophageal cancer Neg Hx      ROS:  Please see the history of present illness.  All other ROS reviewed and negative.     Physical Exam/Data:   Vitals:   11/27/21 0037 11/27/21 0405 11/27/21 0730 11/27/21 1111  BP:  (!) 149/73 (!) 163/81 (!) 170/77   Pulse:  65 95 60  Resp:  18 17 17   Temp:  98.2 F (36.8 C) 98 F (36.7 C) 98.2 F (36.8 C)  TempSrc:  Oral Oral Oral  SpO2:  97% 97% 98%  Weight: 73.2 kg     Height:        Intake/Output Summary (Last 24 hours) at 11/27/2021 1737 Last data filed at 11/27/2021 1454 Gross per 24 hour  Intake 120 ml  Output 600 ml  Net -480 ml   Last 3 Weights 11/27/2021 11/26/2021 09/25/2021  Weight (lbs) 161 lb 4.8 oz 164 lb 171 lb  Weight (kg) 73.165 kg 74.39 kg 77.565 kg     Body mass index is 24.53 kg/m.  General:  Well nourished, well developed, in no acute distress HEENT: normal Neck: no JVD Vascular: No carotid bruits; Distal pulses 2+ bilaterally Cardiac:  normal S1, S2; RRR; no murmur  Lungs:  clear to auscultation bilaterally, no wheezing, rhonchi or rales  Abd: soft, nontender, no hepatomegaly  Ext: no edema Musculoskeletal:  No deformities, BUE and BLE strength normal and equal Skin: warm and dry  Neuro:  CNs 2-12 intact, no focal abnormalities noted Psych:  Normal affect   EKG:  The EKG was personally reviewed and demonstrates:   12/18 ECG is SR, HR 84 Telemetry:  Telemetry was personally reviewed and demonstrates:  SR  Relevant CV Studies:  ECHO: 10/28/2021  1. Left ventricular ejection fraction, by estimation, is 60 to 65%. The  left ventricle has normal function. The left ventricle has no regional  wall motion abnormalities. There is mild left ventricular hypertrophy.  Left ventricular diastolic parameters were normal.   2. Right ventricular systolic function is normal. The right ventricular  size is normal. Tricuspid regurgitation signal is inadequate for assessing PA pressure.  3. The mitral valve is normal in structure. Trivial mitral valve  regurgitation. No evidence of mitral stenosis.   4. The aortic valve was not well visualized. Aortic valve regurgitation  is not visualized. No aortic stenosis is present.   5. The inferior vena cava is normal in size  with greater than 50%  respiratory variability, suggesting right atrial pressure of 3 mmHg.   Laboratory Data:  High Sensitivity Troponin:   Recent Labs  Lab 11/26/21 0833 11/26/21 1023 11/27/21 0636  TROPONINIHS 34* 40* 86*     Chemistry Recent Labs  Lab 11/26/21 0833  NA 132*  K 3.6  CL 100  CO2 22  GLUCOSE 93  BUN 12  CREATININE 0.74  CALCIUM 9.3  GFRNONAA >60  ANIONGAP 10    Recent Labs  Lab 11/26/21 0833  PROT 7.4  ALBUMIN 4.5  AST 29  ALT 27  ALKPHOS 76  BILITOT 0.8   Lipids  Recent Labs  Lab 11/27/21 0204  CHOL 199  TRIG 30  HDL 68  LDLCALC 125*  CHOLHDL 2.9    Hematology Recent Labs  Lab 11/26/21 0833  WBC 5.2  RBC 4.37  HGB 13.6  HCT 38.2  MCV 87.4  MCH 31.1  MCHC 35.6  RDW 12.1  PLT 285    Thyroid No results for input(s): TSH, FREET4 in the last 168 hours.   BNP Recent Labs  Lab 11/26/21 0833  BNP 56.6    DDimer  Recent Labs  Lab 11/27/21 1459  DDIMER 0.40     Radiology/Studies:  CT Head Wo Contrast  Result Date: 11/26/2021 CLINICAL DATA:  Left-sided headache.  Nausea vomiting last night. EXAM: CT HEAD WITHOUT CONTRAST TECHNIQUE: Contiguous axial images were obtained from the base of the skull through the vertex without intravenous contrast. COMPARISON:  10/24/2021 FINDINGS: Brain: No evidence of acute infarction, hemorrhage, hydrocephalus, extra-axial collection or mass lesion/mass effect. Vascular: No hyperdense vessel or unexpected calcification. Skull: Normal. Negative for fracture or focal lesion. Sinuses/Orbits: Globes and orbits are unremarkable. Visualized sinuses are clear. Other: None. IMPRESSION: Normal unenhanced CT scan of the brain. Electronically Signed   By: Lajean Manes M.D.   On: 11/26/2021 10:05   CT Angio Chest PE W and/or Wo Contrast  Result Date: 11/26/2021 CLINICAL DATA:  Shortness of breath, nausea and vomiting. Left-sided headache. EXAM: CT ANGIOGRAPHY CHEST WITH CONTRAST TECHNIQUE:  Multidetector CT imaging of the chest was performed using the standard protocol during bolus administration of intravenous contrast. Multiplanar CT image reconstructions and MIPs were obtained to evaluate the vascular anatomy. CONTRAST:  110mL OMNIPAQUE IOHEXOL 350 MG/ML SOLN COMPARISON:  None. FINDINGS: Cardiovascular: Satisfactory opacification of the pulmonary arteries to the segmental level. No evidence of pulmonary embolism. Normal heart size. No pericardial effusion. Mediastinum/Nodes: No enlarged mediastinal, hilar, or axillary lymph nodes. Thyroid gland, trachea, and esophagus demonstrate no significant findings. Lungs/Pleura: Lungs are clear. No pleural effusion or pneumothorax. Upper Abdomen: No acute abnormality. Musculoskeletal: No chest wall abnormality. No acute or significant osseous findings. Mild dextroscoliosis of the midthoracic spine. Likely hemangioma in the T5 vertebral body. Review of the MIP images confirms the above findings. IMPRESSION: No evidence of pulmonary embolism or acute cardiopulmonary process. Electronically Signed   By: Keane Police D.O.   On: 11/26/2021 10:14   CT ABDOMEN PELVIS W CONTRAST  Result Date: 11/26/2021 CLINICAL DATA:  Left lower quadrant abdominal pain. Nausea and vomiting. Evaluate for diverticulitis. Patient had SI joint effusion on the left 2 weeks ago. EXAM:  CT ABDOMEN AND PELVIS WITH CONTRAST TECHNIQUE: Multidetector CT imaging of the abdomen and pelvis was performed using the standard protocol following bolus administration of intravenous contrast. CONTRAST:  169mL OMNIPAQUE IOHEXOL 350 MG/ML SOLN COMPARISON:  June 27, 2021 FINDINGS: Lower chest: No acute abnormality. Hepatobiliary: Multiple tiny low-attenuation lesions in the liver too small to characterize but likely cysts or small hemangiomas. Portal vein is patent. The gallbladder is normal. Pancreas: Unremarkable. No pancreatic ductal dilatation or surrounding inflammatory changes. Spleen: Normal in  size without focal abnormality. Adrenals/Urinary Tract: Adrenal glands are normal. A 3 mm stone is seen in the left kidney, unchanged. A probable tiny cyst is seen in the lower pole the right kidney, too small to completely characterize. No hydronephrosis or perinephric stranding. No ureterectasis or ureteral stone. The bladder is normal. Stomach/Bowel: Moderate to marked fecal loading throughout the colon. No colonic diverticuli are identified. No evidence of diverticulitis. The appendix is not visualized but there is no evidence of appendicitis. Vascular/Lymphatic: Mild calcified atherosclerosis in the nonaneurysmal abdominal aorta. No adenopathy. Reproductive: Uterus and bilateral adnexa are unremarkable. Other: No free air free fluid. The patient is status post recent left SI joint fusion. Hardware is in good position. Postprocedural changes are seen in the overlying soft tissues. Musculoskeletal: Status post recent left SI joint effusion. No other abnormalities. IMPRESSION: 1. Moderate to marked fecal loading throughout the colon. No diverticulosis or diverticulitis identified. 2. Multiple probable tiny cyst in the liver, too small to characterize but unchanged. 3. 3 mm nonobstructive stone in the left kidney. 4. Probable tiny cyst in the right kidney, too small to characterize. 5. Mild calcified atherosclerosis in the nonaneurysmal aorta. 6. Recent left SI joint effusion. Electronically Signed   By: Dorise Bullion III M.D.   On: 11/26/2021 10:17   ECHOCARDIOGRAM COMPLETE  Result Date: 11/27/2021    ECHOCARDIOGRAM REPORT   Patient Name:   SWETA HALSETH Date of Exam: 11/27/2021 Medical Rec #:  [id number]       Height:       68.0 in Accession #:    [id number]      Weight:       161.3 lb Date of Birth:  [birth year]       BSA:          1.865 m Patient Age:    36 years        BP:           163/81 mmHg Patient Gender: F               HR:           60 bpm. Exam Location:  Inpatient Procedure: 2D Echo, 3D Echo,  Cardiac Doppler and Color Doppler Indications:    R07.9* Chest pain, unspecified  History:        Patient has no prior history of Echocardiogram examinations.                 Signs/Symptoms:Chest Pain; Risk Factors:Hypertension.  Sonographer:    Roseanna Rainbow RDCS Referring Phys: [id number] [facility]  Sonographer Comments: Technically difficult study due to poor echo windows and suboptimal parasternal window. IMPRESSIONS  1. Left ventricular ejection fraction, by estimation, is 60 to 65%. The left ventricle has normal function. The left ventricle has no regional wall motion abnormalities. There is mild left ventricular hypertrophy. Left ventricular diastolic parameters were normal.  2. Right ventricular systolic function is normal. The right ventricular size is normal. Tricuspid regurgitation signal is  inadequate for assessing PA pressure.  3. The mitral valve is normal in structure. Trivial mitral valve regurgitation. No evidence of mitral stenosis.  4. The aortic valve was not well visualized. Aortic valve regurgitation is not visualized. No aortic stenosis is present.  5. The inferior vena cava is normal in size with greater than 50% respiratory variability, suggesting right atrial pressure of 3 mmHg. FINDINGS  Left Ventricle: Left ventricular ejection fraction, by estimation, is 60 to 65%. The left ventricle has normal function. The left ventricle has no regional wall motion abnormalities. The left ventricular internal cavity size was normal in size. There is  mild left ventricular hypertrophy. Left ventricular diastolic parameters were normal. Right Ventricle: The right ventricular size is normal. No increase in right ventricular wall thickness. Right ventricular systolic function is normal. Tricuspid regurgitation signal is inadequate for assessing PA pressure. Left Atrium: Left atrial size was normal in size. Right Atrium: Right atrial size was normal in size. Pericardium: There is no evidence of pericardial  effusion. Mitral Valve: The mitral valve is normal in structure. Trivial mitral valve regurgitation. No evidence of mitral valve stenosis. Tricuspid Valve: The tricuspid valve is normal in structure. Tricuspid valve regurgitation is trivial. Aortic Valve: The aortic valve was not well visualized. Aortic valve regurgitation is not visualized. No aortic stenosis is present. Pulmonic Valve: The pulmonic valve was not well visualized. Pulmonic valve regurgitation is not visualized. Aorta: The aortic root and ascending aorta are structurally normal, with no evidence of dilitation. Venous: The inferior vena cava is normal in size with greater than 50% respiratory variability, suggesting right atrial pressure of 3 mmHg. IAS/Shunts: The interatrial septum was not well visualized.  LEFT VENTRICLE PLAX 2D LVIDd:         4.70 cm      Diastology LVIDs:         3.30 cm      LV e' medial:    7.80 cm/s LV PW:         0.90 cm      LV E/e' medial:  12.4 LV IVS:        1.10 cm      LV e' lateral:   9.73 cm/s LVOT diam:     1.90 cm      LV E/e' lateral: 10.0 LV SV:         71 LV SV Index:   38 LVOT Area:     2.84 cm  LV Volumes (MOD) LV vol d, MOD A2C: 118.0 ml LV vol d, MOD A4C: 82.3 ml LV vol s, MOD A2C: 39.6 ml LV vol s, MOD A4C: 34.1 ml LV SV MOD A2C:     78.4 ml LV SV MOD A4C:     82.3 ml LV SV MOD BP:      62.7 ml RIGHT VENTRICLE             IVC RV S prime:     11.60 cm/s  IVC diam: 1.90 cm TAPSE (M-mode): 2.6 cm LEFT ATRIUM             Index        RIGHT ATRIUM           Index LA diam:        2.50 cm 1.34 cm/m   RA Area:     11.70 cm LA Vol (A2C):   33.1 ml 17.75 ml/m  RA Volume:   24.40 ml  13.08 ml/m LA Vol (A4C):   31.7  ml 17.00 ml/m LA Biplane Vol: 34.8 ml 18.66 ml/m  AORTIC VALVE LVOT Vmax:   106.00 cm/s LVOT Vmean:  71.800 cm/s LVOT VTI:    0.249 m  AORTA Ao Root diam: 3.10 cm Ao Asc diam:  3.55 cm MITRAL VALVE MV Area (PHT): 3.48 cm    SHUNTS MV Decel Time: 218 msec    Systemic VTI:  0.25 m MR PISA:        0.57  cm   Systemic Diam: 1.90 cm MR PISA Radius: 0.30 cm MV E velocity: 96.90 cm/s MV A velocity: 77.60 cm/s MV E/A ratio:  1.25 Oswaldo Milian MD Electronically signed by Oswaldo Milian MD Signature Date/Time: 11/27/2021/1:04:31 PM    Final      Assessment and Plan:   Chest pain -Chest pain does not occur with exertion, it occurs with a constellation of other symptoms which there is no clear cause - Her cardiac enzymes have some mild elevation -Her ECG is normal and her echocardiogram is also normal - However, she would benefit from a definitive evaluation of her chest pain - She has had IV dye before with no reaction, think cardiac CT the best option. - We will order this for a.m. - ck TSH  Otherwise, per IM.  However, I did suggest that she see an allergy specialist about her symptoms.   Risk Assessment/Risk Scores:     HEAR Score (for undifferentiated chest pain):  HEAR Score: 3   For questions or updates, please contact [place] Please consult [email or web address] for contact info under    Signed, Rosaria Ferries, PA-C  11/27/2021 5:37 PM

## 2021-11-27 NOTE — Progress Notes (Addendum)
Patient called out reporting a wave of shortness of breath, itching and anxiety. Patient is concerned that she is having an allergic reaction. Unable to administer benadryl due to the length of time between the last dose. No visible sign of rash and patient's O2 98% on room air.Patient is wondering that she could be withdrawing from her SSRI and she has serotonin syndrome.  Notified MD.

## 2021-11-27 NOTE — Progress Notes (Addendum)
[facility]                                                                              Patient Demographics  Alyssa [name], is a 70 y.o. female, DOB - 17-Feb-1951, FOY:[id number]  Admit date - 11/26/2021   Admitting Physician Lequita Halt, MD  Outpatient Primary MD for the patient is [facility], Leonette Monarch, MD  Outpatient specialists:   LOS - 0  days   Medical records reviewed and are as summarized below:    Chief Complaint  Patient presents with   Nausea       Brief summary   Patient is a 70 year old female with HTN, not on meds, anxiety, depression, lumbar radiculopathy with recent SI joint fusion 2 weeks ago, chronic hyponatremia presented with recurrent chest pains.  Patient reported that symptoms started several days after her SI joint fusion surgery.  She started to experience episodes of tingling sensation in the face, neck and aching chest pains associated with nausea and shortness of breath episode lasting 20 to 30 minutes and spontaneously resolved.  She can get as many as 1-2 episodes in a day however daily for depression she experienced 3 such episodes and on the night of admission, had a severe episode which woke her up and she could not sleep. 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  Patient has been following up with her PCP and her nephrologist.  UA positive for ketones. Patient was admitted for chest pain rule out and dehydration  Assessment & Plan    Principal Problem: Atypical chest pain, borderline elevation of troponins -Risk factors include hypertension, not on meds.  Hyperlipidemia, LDL 125 -Troponin 34-40--86 -Cardiology consulted, nuclear medicine stress test was ordered however patient could not do it due to " allergic reaction", unclear etiology.  Per patient's request, stress test canceled. -2D echo showed EF of 60 to 65%, no R WMA - will check D-dimer given recent spinal fusion surgery to rule out PE.  If  positive, will obtain CTA chest -Continue aspirin, Lipitor  Active Problems: Anxiety, depression -Continue Lexapro,  - given the history of rash, itching and acute anxiety, added Atarax as needed, patient agreeable with the plan   Recent SI fusion surgery 2 weeks ago with symptoms of neuropathy and radiculopathy -Per patient, she still is having symptoms of pain to the gluteus muscle, pins-and-needles feeling etc.  -she has been on Lyrica in the past and was not able to tolerate it, will continue Neurontin, added Cymbalta, 30 mg daily, can titrate up to 60 mg daily for pain/neuropathy. -Recommended tramadol as needed and avoid narcotics as patient also has significant constipation and gut issues Addendum: 2:58PM Patient complaining about significant pain at her surgical site, radiating to the gluteus, states 10 out of 10 pain.  Tearful and very anxious. -I have consulted neurosurgery, Dr. Reatha Armour for evaluation per patient's request.   Recent nausea and vomiting, dehydration -Currently improving, continue Zofran as needed -UA showed ketones, placed on IV fluid hydration for 24 hours  Chronic hyponatremia -Nausea and vomiting prior to admission.   -UA positive for ketones, placed on IV fluid  hydration, will recheck BMET  Hx constipation, SIBO -B12 normal, 1344, follow folate -Outpatient follow-up with Dr. Bryan Lemma -Continue bowel regimen  Hypothyroidism -Continue Synthroid  Myalgias and arthralgias -Intermittent complaints, CK normal, patient has followed up with her PCP, extensive work-up including ESR, CRP, RA factor, cortisol normal, also has intermittent rash, currently not present -Recommended outpatient follow-up with rheumatology  Elevated BP readings Not on any antihypertensives outpatient, continue hydralazine ordered parameters - will follow closely, may need to add antihypertensive at discharge   Code Status: Full CODE STATUS DVT Prophylaxis:  enoxaparin  (LOVENOX) injection 40 mg Start: 11/26/21 1715   Level of Care: Level of care: Telemetry Medical Family Communication: Discussed all imaging results, lab results, explained to the patient and husband at the bedside   Disposition Plan:     Status is: Observation  The patient remains OBS appropriate and will d/c before 2 midnights.  Hopefully DC home in a.m.  Time Spent in minutes 35 minutes  Procedures:  2D echo Per patient, could not tolerate the stress test  Consultants:   Cardiology Antimicrobials:   Anti-infectives (From admission, onward)    None          Medications  Scheduled Meds:  aspirin EC  81 mg Oral Daily   atorvastatin  10 mg Oral Daily   DULoxetine  30 mg Oral Daily   enoxaparin (LOVENOX) injection  40 mg Subcutaneous Q24H   estradiol   Vaginal Q M,W,F   gabapentin  300 mg Oral BID   levothyroxine  112 mcg Oral Once per day on Mon Tue Wed Thu Fri Sat   And   [START ON 12/03/2021] levothyroxine  224 mcg Oral Once per day on Sun   timolol  1 drop Both Eyes Daily   zolpidem  5 mg Oral QHS   Continuous Infusions:  sodium chloride 100 mL/hr at 11/27/21 1215   PRN Meds:.acetaminophen, hydrALAZINE, hydrOXYzine, ketorolac, magnesium hydroxide, ondansetron (ZOFRAN) IV, oxyCODONE, traMADol      Subjective:   Alyssa [name] was seen and examined today.  Could not tolerate stress test, states that she felt that she had "a histamine reaction" Somewhat anxious, at the time of my encounter no other acute symptoms, no chest pain, acute shortness of breath, abdominal pain.  Did have nausea prior to admission.   Objective:   Vitals:   11/27/21 0037 11/27/21 0405 11/27/21 0730 11/27/21 1111  BP:  (!) 149/73 (!) 163/81 (!) 170/77  Pulse:  65 95 60  Resp:  18 17 17   Temp:  98.2 F (36.8 C) 98 F (36.7 C) 98.2 F (36.8 C)  TempSrc:  Oral Oral Oral  SpO2:  97% 97% 98%  Weight: 73.2 kg     Height:        Intake/Output Summary (Last 24 hours) at  11/27/2021 1349 Last data filed at 11/27/2021 1247 Gross per 24 hour  Intake 0 ml  Output 600 ml  Net -600 ml     Wt Readings from Last 3 Encounters:  11/27/21 73.2 kg  09/25/21 77.6 kg  09/12/21 78.2 kg     Exam General: Alert and oriented x 3, NAD Cardiovascular: S1 S2 auscultated, no murmurs, RRR Respiratory: Clear to auscultation bilaterally, no wheezing Gastrointestinal: Soft, nontender, nondistended, + bowel sounds Ext: no pedal edema bilaterally Neuro: no new deficits Skin: No rashes Psych: somewhat anxious   Data Reviewed:  I have personally reviewed following labs and imaging studies  Micro Results Recent Results (from the past 240  hour(s))  Resp Panel by RT-PCR (Flu A&B, Covid) Nasopharyngeal Swab     Status: None   Collection Time: 11/26/21 12:21 PM   Specimen: Nasopharyngeal Swab; Nasopharyngeal(NP) swabs in vial transport medium  Result Value Ref Range Status   SARS Coronavirus 2 by RT PCR NEGATIVE NEGATIVE Final    Comment: (NOTE) SARS-CoV-2 target nucleic acids are NOT DETECTED.  The SARS-CoV-2 RNA is generally detectable in upper respiratory specimens during the acute phase of infection. The lowest concentration of SARS-CoV-2 viral copies this assay can detect is 138 copies/mL. A negative result does not preclude SARS-Cov-2 infection and should not be used as the sole basis for treatment or other patient management decisions. A negative result may occur with  improper specimen collection/handling, submission of specimen other than nasopharyngeal swab, presence of viral mutation(s) within the areas targeted by this assay, and inadequate number of viral copies(<138 copies/mL). A negative result must be combined with clinical observations, patient history, and epidemiological information. The expected result is Negative.  Fact Sheet for Patients:  EntrepreneurPulse.com.au  Fact Sheet for Healthcare Providers:   IncredibleEmployment.be  This test is no t yet approved or cleared by the [place] FDA and  has been authorized for detection and/or diagnosis of SARS-CoV-2 by FDA under an Emergency Use Authorization (EUA). This EUA will remain  in effect (meaning this test can be used) for the duration of the COVID-19 declaration under Section 564(b)(1) of the Act, 21 U.S.C.section 360bbb-3(b)(1), unless the authorization is terminated  or revoked sooner.       Influenza A by PCR NEGATIVE NEGATIVE Final   Influenza B by PCR NEGATIVE NEGATIVE Final    Comment: (NOTE) The Xpert Xpress SARS-CoV-2/FLU/RSV plus assay is intended as an aid in the diagnosis of influenza from Nasopharyngeal swab specimens and should not be used as a sole basis for treatment. Nasal washings and aspirates are unacceptable for Xpert Xpress SARS-CoV-2/FLU/RSV testing.  Fact Sheet for Patients: EntrepreneurPulse.com.au  Fact Sheet for Healthcare Providers: IncredibleEmployment.be  This test is not yet approved or cleared by the [place] FDA and has been authorized for detection and/or diagnosis of SARS-CoV-2 by FDA under an Emergency Use Authorization (EUA). This EUA will remain in effect (meaning this test can be used) for the duration of the COVID-19 declaration under Section 564(b)(1) of the Act, 21 U.S.C. section 360bbb-3(b)(1), unless the authorization is terminated or revoked.  Performed at [facility], [address], [place], [place] [address]     Radiology Reports CT Head Wo Contrast  Result Date: 11/26/2021 CLINICAL DATA:  Left-sided headache.  Nausea vomiting last night. EXAM: CT HEAD WITHOUT CONTRAST TECHNIQUE: Contiguous axial images were obtained from the base of the skull through the vertex without intravenous contrast. COMPARISON:  10/24/2021 FINDINGS: Brain: No evidence of acute infarction, hemorrhage, hydrocephalus,  extra-axial collection or mass lesion/mass effect. Vascular: No hyperdense vessel or unexpected calcification. Skull: Normal. Negative for fracture or focal lesion. Sinuses/Orbits: Globes and orbits are unremarkable. Visualized sinuses are clear. Other: None. IMPRESSION: Normal unenhanced CT scan of the brain. Electronically Signed   By: Lajean Manes M.D.   On: 11/26/2021 10:05   CT Angio Chest PE W and/or Wo Contrast  Result Date: 11/26/2021 CLINICAL DATA:  Shortness of breath, nausea and vomiting. Left-sided headache. EXAM: CT ANGIOGRAPHY CHEST WITH CONTRAST TECHNIQUE: Multidetector CT imaging of the chest was performed using the standard protocol during bolus administration of intravenous contrast. Multiplanar CT image reconstructions and MIPs were obtained to  evaluate the vascular anatomy. CONTRAST:  125m OMNIPAQUE IOHEXOL 350 MG/ML SOLN COMPARISON:  None. FINDINGS: Cardiovascular: Satisfactory opacification of the pulmonary arteries to the segmental level. No evidence of pulmonary embolism. Normal heart size. No pericardial effusion. Mediastinum/Nodes: No enlarged mediastinal, hilar, or axillary lymph nodes. Thyroid gland, trachea, and esophagus demonstrate no significant findings. Lungs/Pleura: Lungs are clear. No pleural effusion or pneumothorax. Upper Abdomen: No acute abnormality. Musculoskeletal: No chest wall abnormality. No acute or significant osseous findings. Mild dextroscoliosis of the midthoracic spine. Likely hemangioma in the T5 vertebral body. Review of the MIP images confirms the above findings. IMPRESSION: No evidence of pulmonary embolism or acute cardiopulmonary process. Electronically Signed   By: IKeane PoliceD.O.   On: 11/26/2021 10:14   CT ABDOMEN PELVIS W CONTRAST  Result Date: 11/26/2021 CLINICAL DATA:  Left lower quadrant abdominal pain. Nausea and vomiting. Evaluate for diverticulitis. Patient had SI joint effusion on the left 2 weeks ago. EXAM: CT ABDOMEN AND PELVIS WITH  CONTRAST TECHNIQUE: Multidetector CT imaging of the abdomen and pelvis was performed using the standard protocol following bolus administration of intravenous contrast. CONTRAST:  1053mOMNIPAQUE IOHEXOL 350 MG/ML SOLN COMPARISON:  June 27, 2021 FINDINGS: Lower chest: No acute abnormality. Hepatobiliary: Multiple tiny low-attenuation lesions in the liver too small to characterize but likely cysts or small hemangiomas. Portal vein is patent. The gallbladder is normal. Pancreas: Unremarkable. No pancreatic ductal dilatation or surrounding inflammatory changes. Spleen: Normal in size without focal abnormality. Adrenals/Urinary Tract: Adrenal glands are normal. A 3 mm stone is seen in the left kidney, unchanged. A probable tiny cyst is seen in the lower pole the right kidney, too small to completely characterize. No hydronephrosis or perinephric stranding. No ureterectasis or ureteral stone. The bladder is normal. Stomach/Bowel: Moderate to marked fecal loading throughout the colon. No colonic diverticuli are identified. No evidence of diverticulitis. The appendix is not visualized but there is no evidence of appendicitis. Vascular/Lymphatic: Mild calcified atherosclerosis in the nonaneurysmal abdominal aorta. No adenopathy. Reproductive: Uterus and bilateral adnexa are unremarkable. Other: No free air free fluid. The patient is status post recent left SI joint fusion. Hardware is in good position. Postprocedural changes are seen in the overlying soft tissues. Musculoskeletal: Status post recent left SI joint effusion. No other abnormalities. IMPRESSION: 1. Moderate to marked fecal loading throughout the colon. No diverticulosis or diverticulitis identified. 2. Multiple probable tiny cyst in the liver, too small to characterize but unchanged. 3. 3 mm nonobstructive stone in the left kidney. 4. Probable tiny cyst in the right kidney, too small to characterize. 5. Mild calcified atherosclerosis in the nonaneurysmal aorta.  6. Recent left SI joint effusion. Electronically Signed   By: DaDorise BullionII M.D.   On: 11/26/2021 10:17   ECHOCARDIOGRAM COMPLETE  Result Date: 11/27/2021    ECHOCARDIOGRAM REPORT   Patient Name:   Alyssa BETTESate of Exam: 11/27/2021 Medical Rec #:  [id number]     Height:       68.0 in Accession #:    [id number]    Weight:       161.3 lb Date of Birth:  6/08-Oct-1951     BSA:          1.865 m Patient [age]:    [age]        BP:           163/81 mmHg Patient Gender: F  HR:           60 bpm. Exam Location:  Inpatient Procedure: 2D Echo, 3D Echo, Cardiac Doppler and Color Doppler Indications:    R07.9* Chest pain, unspecified  History:        Patient has no prior history of Echocardiogram examinations.                 Signs/Symptoms:Chest Pain; Risk Factors:Hypertension.  Sonographer:    Roseanna Rainbow RDCS Referring Phys: [id number] [facility]  Sonographer Comments: Technically difficult study due to poor echo windows and suboptimal parasternal window. IMPRESSIONS  1. Left ventricular ejection fraction, by estimation, is 60 to 65%. The left ventricle has normal function. The left ventricle has no regional wall motion abnormalities. There is mild left ventricular hypertrophy. Left ventricular diastolic parameters were normal.  2. Right ventricular systolic function is normal. The right ventricular size is normal. Tricuspid regurgitation signal is inadequate for assessing PA pressure.  3. The mitral valve is normal in structure. Trivial mitral valve regurgitation. No evidence of mitral stenosis.  4. The aortic valve was not well visualized. Aortic valve regurgitation is not visualized. No aortic stenosis is present.  5. The inferior vena cava is normal in size with greater than 50% respiratory variability, suggesting right atrial pressure of 3 mmHg. FINDINGS  Left Ventricle: Left ventricular ejection fraction, by estimation, is 60 to 65%. The left ventricle has normal function. The left  ventricle has no regional wall motion abnormalities. The left ventricular internal cavity size was normal in size. There is  mild left ventricular hypertrophy. Left ventricular diastolic parameters were normal. Right Ventricle: The right ventricular size is normal. No increase in right ventricular wall thickness. Right ventricular systolic function is normal. Tricuspid regurgitation signal is inadequate for assessing PA pressure. Left Atrium: Left atrial size was normal in size. Right Atrium: Right atrial size was normal in size. Pericardium: There is no evidence of pericardial effusion. Mitral Valve: The mitral valve is normal in structure. Trivial mitral valve regurgitation. No evidence of mitral valve stenosis. Tricuspid Valve: The tricuspid valve is normal in structure. Tricuspid valve regurgitation is trivial. Aortic Valve: The aortic valve was not well visualized. Aortic valve regurgitation is not visualized. No aortic stenosis is present. Pulmonic Valve: The pulmonic valve was not well visualized. Pulmonic valve regurgitation is not visualized. Aorta: The aortic root and ascending aorta are structurally normal, with no evidence of dilitation. Venous: The inferior vena cava is normal in size with greater than 50% respiratory variability, suggesting right atrial pressure of 3 mmHg. IAS/Shunts: The interatrial septum was not well visualized.  LEFT VENTRICLE PLAX 2D LVIDd:         4.70 cm      Diastology LVIDs:         3.30 cm      LV e' medial:    7.80 cm/s LV PW:         0.90 cm      LV E/e' medial:  12.4 LV IVS:        1.10 cm      LV e' lateral:   9.73 cm/s LVOT diam:     1.90 cm      LV E/e' lateral: 10.0 LV SV:         71 LV SV Index:   38 LVOT Area:     2.84 cm  LV Volumes (MOD) LV vol d, MOD A2C: 118.0 ml LV vol d, MOD A4C: 82.3 ml LV vol  s, MOD A2C: 39.6 ml LV vol s, MOD A4C: 34.1 ml LV SV MOD A2C:     78.4 ml LV SV MOD A4C:     82.3 ml LV SV MOD BP:      62.7 ml RIGHT VENTRICLE             IVC RV S  prime:     11.60 cm/s  IVC diam: 1.90 cm TAPSE (M-mode): 2.6 cm LEFT ATRIUM             Index        RIGHT ATRIUM           Index LA diam:        2.50 cm 1.34 cm/m   RA Area:     11.70 cm LA Vol (A2C):   33.1 ml 17.75 ml/m  RA Volume:   24.40 ml  13.08 ml/m LA Vol (A4C):   31.7 ml 17.00 ml/m LA Biplane Vol: 34.8 ml 18.66 ml/m  AORTIC VALVE LVOT Vmax:   106.00 cm/s LVOT Vmean:  71.800 cm/s LVOT VTI:    0.249 m  AORTA Ao Root diam: 3.10 cm Ao Asc diam:  3.55 cm MITRAL VALVE MV Area (PHT): 3.48 cm    SHUNTS MV Decel Time: 218 msec    Systemic VTI:  0.25 m MR PISA:        0.57 cm   Systemic Diam: 1.90 cm MR PISA Radius: 0.30 cm MV E velocity: 96.90 cm/s MV A velocity: 77.60 cm/s MV E/A ratio:  1.25 Oswaldo Milian MD Electronically signed by Oswaldo Milian MD Signature Date/Time: 11/27/2021/1:04:31 PM    Final     Lab Data:  CBC: Recent Labs  Lab 11/26/21 0833  WBC 5.2  NEUTROABS 3.3  HGB 13.6  HCT 38.2  MCV 87.4  PLT 300   Basic Metabolic Panel: Recent Labs  Lab 11/26/21 0833  NA 132*  K 3.6  CL 100  CO2 22  GLUCOSE 93  BUN 12  CREATININE 0.74  CALCIUM 9.3   GFR: Estimated Creatinine Clearance: 67.9 mL/min (by C-G formula based on SCr of 0.74 mg/dL). Liver Function Tests: Recent Labs  Lab 11/26/21 0833  AST 29  ALT 27  ALKPHOS 76  BILITOT 0.8  PROT 7.4  ALBUMIN 4.5   Recent Labs  Lab 11/26/21 0833  LIPASE 37   No results for input(s): AMMONIA in the last 168 hours. Coagulation Profile: No results for input(s): INR, PROTIME in the last 168 hours. Cardiac Enzymes: Recent Labs  Lab 11/27/21 1103  CKTOTAL 85   BNP (last 3 results) No results for input(s): PROBNP in the last 8760 hours. HbA1C: No results for input(s): HGBA1C in the last 72 hours. CBG: No results for input(s): GLUCAP in the last 168 hours. Lipid Profile: Recent Labs    11/27/21 0204  CHOL 199  HDL 68  LDLCALC 125*  TRIG 30  CHOLHDL 2.9   Thyroid Function Tests: No  results for input(s): TSH, T4TOTAL, FREET4, T3FREE, THYROIDAB in the last 72 hours. Anemia Panel: Recent Labs    11/27/21 1103  VITAMINB12 1,344*   Urine analysis:    Component Value Date/Time   COLORURINE YELLOW 11/26/2021 [place] 11/26/2021 1057   LABSPEC 1.010 11/26/2021 1057   PHURINE 7.5 11/26/2021 1057   GLUCOSEU NEGATIVE 11/26/2021 1057   HGBUR NEGATIVE 11/26/2021 [place] 11/26/2021 1057   BILIRUBINUR negative 09/25/2021 1534   KETONESUR 40 (A) 11/26/2021 1057  PROTEINUR NEGATIVE 11/26/2021 1057   UROBILINOGEN 0.2 09/25/2021 1534   NITRITE NEGATIVE 11/26/2021 [place] 11/26/2021 1057     Blondie Riggsbee M.D. [facility] 11/27/2021, 1:49 PM  Available via Epic secure chat 7am-7pm After 7 pm, please refer to night coverage provider listed on amion.

## 2021-11-27 NOTE — Consult Note (Signed)
° °  Providing Compassionate, Quality Care - Together  Neurosurgery Consult  Referring physician: Dr. Tana Coast Reason for referral: SI pain  Chief Complaint: Increasing gluteal pain, abdominal pain, chest pain  History of Present Illness: This is a 70 year old female with a history of bilateral, left greater than right sacroiliitis, status post left sacroiliac fusion on 11/08/2021 who presented the hospital with complaints of worsening bilateral gluteal pain, numbness and tingling/neuropathy in her bilateral feet, abdominal pain and chest tightness.  She has undergone extensive cardiac work-up at this point and appears to be somewhat more comfortable.  She denies any fevers, wound drainage.  Her incisions are healing well at this point.  She did note increasing gluteal pain after bending over and loading the laundry and since then has had increasing gluteal pain.  Medications: I have reviewed the patient's current medications. Allergies: No Known Allergies  History reviewed. No pertinent family history. Social History:  has no history on file for tobacco use, alcohol use, and drug use.  ROS: All pertinent positives and negatives are listed in HPI above  Physical Exam:  Vital signs in last 24 hours: Temp:  [98 F (36.7 C)-98.3 F (36.8 C)] 98 F (36.7 C) (07/25 1814) Pulse Rate:  [58-128] 65 (07/26 0746) Resp:  [11-18] 14 (07/26 0217) BP: (138-182)/(65-125) 153/88 (07/26 0700) SpO2:  [91 %-98 %] 96 % (07/26 0746) PE: Awake alert oriented x3 PERRLA EOMI Cranial nerves II through XII intact Bilateral upper extremity 5/5 Bilateral lower extremity 5/5 Left gluteal incisions clean dry and intact, healed well.  Some gluteal ecchymoses as expected    Impression/Assessment:  70 year old female with  Left sacroiliitis, status post SI fusion  Plan:  -I adjusted her pain regimen, increasing her Percocet to 7.5 mg every 4 hours.  I increased her gabapentin 300 mg 3 times  daily. -Continue pain control.  I do believe that her surgical pain is more controlled at this point after her episode of extreme pain.  We will continue to monitor her closely. -Cardiac work-up per cardiology -CT abdomen pelvis reviewed, hardware appears intact with SI fusion.  There appears to be no fracture, hardware malfunction or hematoma. -Bowel regimen for constipation -Activity as tolerated, continue to limit bending lifting and twisting no more than 10 pounds at this time.   Thank you for allowing me to participate in this patient's care.  Please do not hesitate to call with questions or concerns.   Elwin Sleight, [facility] Cell: [phone]

## 2021-11-27 NOTE — Progress Notes (Addendum)
Patient requesting morphine by name. Patient stating  nothing by mouth will help her Pain. Notified MD.

## 2021-11-27 NOTE — Progress Notes (Signed)
2D Echocardiogram has been performed. 

## 2021-11-27 NOTE — Progress Notes (Deleted)
°  Echocardiogram 2D Echocardiogram has been performed.  Alyssa [name] 11/27/2021, 9:10 AM

## 2021-11-27 NOTE — Progress Notes (Signed)
Patient in room crying without tears because she is in agony. Patient refused to take tramadol. Patient states that only morphine will help her pain. Patient's pain is from surgical site. Patient's husband demanding for someone to help his wife while he is crying. Notified MD.

## 2021-11-28 ENCOUNTER — Inpatient Hospital Stay (HOSPITAL_COMMUNITY): Payer: Medicare Other

## 2021-11-28 DIAGNOSIS — I700 Atherosclerosis of aorta: Secondary | ICD-10-CM

## 2021-11-28 DIAGNOSIS — R079 Chest pain, unspecified: Secondary | ICD-10-CM

## 2021-11-28 LAB — TSH: TSH: 9.15 u[IU]/mL — ABNORMAL HIGH (ref 0.350–4.500)

## 2021-11-28 LAB — BASIC METABOLIC PANEL
Anion gap: 7 (ref 5–15)
BUN: 10 mg/dL (ref 8–23)
CO2: 23 mmol/L (ref 22–32)
Calcium: 8.7 mg/dL — ABNORMAL LOW (ref 8.9–10.3)
Chloride: 100 mmol/L (ref 98–111)
Creatinine, Ser: 0.78 mg/dL (ref 0.44–1.00)
GFR, Estimated: >60 mL/min (ref >60)
Glucose, Bld: 86 mg/dL (ref 70–99)
Potassium: 3.9 mmol/L (ref 3.5–5.1)
Sodium: 130 mmol/L — ABNORMAL LOW (ref 135–145)

## 2021-11-28 MED ORDER — HYDROXYZINE HCL 25 MG PO TABS
25.0000 mg | ORAL_TABLET | Freq: Three times a day (TID) | ORAL | 0 refills | Status: DC | PRN
Start: 2021-11-28 — End: 2022-06-07

## 2021-11-28 MED ORDER — ASPIRIN 81 MG PO TBEC: 81.0000 mg | DELAYED_RELEASE_TABLET | Freq: Every day | ORAL | 3 refills | Status: DC

## 2021-11-28 MED ORDER — GABAPENTIN 300 MG PO CAPS: 300.0000 mg | ORAL_CAPSULE | Freq: Three times a day (TID) | ORAL | 2 refills | Status: DC

## 2021-11-28 MED ORDER — IOHEXOL 350 MG/ML SOLN
95.0000 mL | Freq: Once | INTRAVENOUS | Status: AC | PRN
  Administered 2021-11-28: 13:00:00 95 mL via INTRAVENOUS

## 2021-11-28 MED ORDER — OXYCODONE-ACETAMINOPHEN 7.5-325 MG PO TABS: 1.0000 | ORAL_TABLET | ORAL | 0 refills | Status: DC | PRN

## 2021-11-28 MED ORDER — DULOXETINE HCL 30 MG PO CPEP: 30.0000 mg | ORAL_CAPSULE | Freq: Every day | ORAL | 0 refills | Status: DC

## 2021-11-28 MED ORDER — METOPROLOL TARTRATE 100 MG PO TABS
100.0000 mg | ORAL_TABLET | Freq: Once | ORAL | Status: AC
  Administered 2021-11-28: 10:00:00 100 mg via ORAL
  Filled 2021-11-28: qty 1

## 2021-11-28 MED ORDER — ATORVASTATIN CALCIUM 10 MG PO TABS: 10.0000 mg | ORAL_TABLET | Freq: Every day | ORAL | 3 refills | Status: DC

## 2021-11-28 MED ORDER — NITROGLYCERIN 0.4 MG SL SUBL
SUBLINGUAL_TABLET | SUBLINGUAL | Status: AC
  Filled 2021-11-28: qty 2

## 2021-11-28 NOTE — Discharge Summary (Signed)
Physician Discharge Summary   Patient ID: Alyssa [name] MRN: [id number] DOB/AGE: [birth year] 70 y.o.  Admit date: 11/26/2021 Discharge date: 11/28/2021  Primary Care Physician:  Lequita Halt, MD   Recommendations for Outpatient Follow-up:  Follow up with PCP in 1-2 weeks   Home Health: None  Equipment/Devices:   Discharge Condition: stable o CODE STATUS: FULL  Diet recommendation: Heart healthy diet   Discharge Diagnoses:     Atypical chest pain  Anxiety, depression Recent SI fusion surgery with symptoms of neuropathy, radiculopathy Dehydration Recent nausea and vomiting, resolved Chronic hyponatremia History of constipation, SIBO  Consults:   Cardiology  Neurosurgery, Dr Dawley     Allergies:   Allergies  Allergen Reactions   Amoxicillin Hives     DISCHARGE MEDICATIONS: Allergies as of 11/28/2021       Reactions   Amoxicillin Hives        Medication List     STOP taking these medications    Fluad Quadrivalent 0.5 ML injection Generic drug: influenza vaccine adjuvanted   traMADol 50 MG tablet Commonly known as: ULTRAM   traZODone 100 MG tablet Commonly known as: DESYREL       TAKE these medications    acetaminophen 325 MG tablet Commonly known as: TYLENOL Take 325-650 mg by mouth every 6 (six) hours as needed for headache (pain).   aspirin 81 MG EC tablet Take 1 tablet (81 mg total) by mouth daily. Swallow whole. Start taking on: November 29, 2021   atorvastatin 10 MG tablet Commonly known as: LIPITOR Take 1 tablet (10 mg total) by mouth daily. Start taking on: November 29, 2021   DULoxetine 30 MG capsule Commonly known as: CYMBALTA Take 1 capsule (30 mg total) by mouth daily.   EPINEPHrine 0.3 mg/0.3 mL Soaj injection Commonly known as: EPI-PEN Inject 0.3 mg into the muscle once as needed for anaphylaxis (severe allergic reaction).   escitalopram 10 MG tablet Commonly known as: LEXAPRO Take 10 mg by mouth  every morning.   Estradiol 10 MCG Tabs vaginal tablet Place 1 tablet (10 mcg total) vaginally 3 (three) times a week. What changed:  when to take this additional instructions   fluticasone 50 MCG/ACT nasal spray Commonly known as: FLONASE Place 1 spray into both nostrils at bedtime as needed for allergies or rhinitis.   gabapentin 300 MG capsule Commonly known as: NEURONTIN Take 1 capsule (300 mg total) by mouth 3 (three) times daily. What changed: when to take this   hydrocortisone cream 1 % Apply 1 application topically daily as needed for itching.   hydrOXYzine 25 MG tablet Commonly known as: ATARAX Take 1 tablet (25 mg total) by mouth 3 (three) times daily as needed for itching or anxiety.   levothyroxine 112 MCG tablet Commonly known as: SYNTHROID Take 1 tablet (112 mcg total) by mouth as directed. Two on sundays and 1 tablet the rest of the week What changed:  when to take this additional instructions   METANX PO Take 1-2 capsules by mouth daily.   naproxen sodium 220 MG tablet Commonly known as: ALEVE Take 220 mg by mouth 2 (two) times daily as needed (pain/headache).   oxyCODONE-acetaminophen 7.5-325 MG tablet Commonly known as: Percocet Take 1 tablet by mouth every 4 (four) hours as needed for severe pain.   pyridOXINE 100 MG tablet Commonly known as: VITAMIN B-6 Take 100 mg by mouth daily.   timolol 0.5 % ophthalmic solution Commonly known as: TIMOPTIC timolol maleate 0.5 % eye drops  once daily   Travoprost (BAK Free) 0.004 % Soln ophthalmic solution Commonly known as: TRAVATAN 1 drop at bedtime.   valACYclovir 500 MG tablet Commonly known as: VALTREX Take 500 mg by mouth daily.   VITAMIN B-12 PO Take 1 tablet by mouth daily.   vitamin C 1000 MG tablet Take 1,000 mg by mouth daily.   Vitamin D-3 125 MCG (5000 UT) Tabs Take 5,000 Units by mouth daily.   vitamin E 180 MG (400 UNITS) capsule Take 400 Units by mouth daily.   zolpidem 5 MG  tablet Commonly known as: AMBIEN Take 5 mg by mouth at bedtime as needed for sleep.               Discharge Care Instructions  (From admission, onward)           Start     Ordered   11/28/21 0000  If the dressing is still on your incision site when you go home, remove it on the third day after your surgery date. Remove dressing if it begins to fall off, or if it is dirty or damaged before the third day.        11/28/21 1414             Brief H and P: For complete details please refer to admission H and P, but in brief Patient is a 70 year old female with HTN, not on meds, anxiety, depression, lumbar radiculopathy with recent SI joint fusion 2 weeks ago, chronic hyponatremia presented with recurrent chest pains.  Patient reported that symptoms started several days after her SI joint fusion surgery.  She started to experience episodes of tingling sensation in the face, neck and aching chest pains associated with nausea and shortness of breath episode lasting 20 to 30 minutes and spontaneously resolved.  She can get as many as 1-2 episodes in a day however daily for depression she experienced 3 such episodes and on the night of admission, had a severe episode which woke her up and she could not sleep. 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  Patient has been following up with her PCP and her nephrologist.  UA positive for ketones. Patient was admitted for chest pain rule out and dehydration  Hospital Course:  Atypical chest pain, borderline elevation of troponins -Risk factors include hypertension, not on meds.  Hyperlipidemia, LDL 125 -Troponin 34-40--86 -nuclear medicine stress test was ordered however patient could not do it due to " allergic reaction", unclear etiology.  Per patient's request, stress test canceled. -2D echo showed EF of 60 to 65%, no R WMA -CT angiogram chest was negative for PE or acute cardiopulmonary process  -Coronary CT done,  calcium score of 0, normal coronary arteries in with right dominance, no evidence of coronary artery disease.  Cleared by cardiology to be discharged home    Anxiety, depression -Continue Lexapro -given the history of rash, itching and acute anxiety, added Atarax as needed, patient agreeable with the plan     Recent SI fusion surgery 2 weeks ago with symptoms of neuropathy and radiculopathy -Per patient, she still is having symptoms of pain to the gluteus muscle, pins-and-needles feeling etc.  -she has been on Lyrica in the past and was not able to tolerate it, will continue Neurontin, added Cymbalta, 30 mg daily, can titrate up to 60 mg daily for pain/neuropathy. -Appreciate neurosurgery follow-up by Dr. Reatha Armour and recommendations, increased Percocet and Neurontin to 300 mg 3 times daily.  Outpatient follow-up  with neurosurgery     Recent nausea and vomiting, dehydration -Resolved, tolerating diet -UA on admission showed ketones, continue IV fluid hydration   Chronic hyponatremia -Nausea and vomiting prior to admission.   -UA positive for ketones, placed on IV fluid hydration, no further nausea vomiting --tolerating diet.   Hx constipation, SIBO -B12 normal, 1344, follow folate -Outpatient follow-up with Dr. Bryan Lemma -Continue bowel regimen   Hypothyroidism -Continue Synthroid   Myalgias and arthralgias -Intermittent complaints, CK normal, patient has followed up with her PCP, extensive work-up including ESR, CRP, RA factor, cortisol normal, also has intermittent rash, currently not present -Recommended outpatient follow-up with rheumatology   Elevated BP readings Not on any antihypertensives outpatient, continue hydralazine ordered parameters - may need to add antihypertensive outpatient, follow-up with PCP      Day of Discharge S: Doing well, no acute complaints no further chest pain, nausea or vomiting.  BP (!) 149/73 (BP Location: Right Arm)    Pulse 89    Temp 97.9  F (36.6 C) (Oral)    Resp 17    Ht _0  (1.727 m)    Wt 73.1 kg Comment: scale b   SpO2 99%    BMI 24.51 kg/m   Physical Exam: General: Alert and awake oriented x3 not in any acute distress. CVS: S1-S2 clear no murmur rubs or gallops Chest: clear to auscultation bilaterally, no wheezing rales or rhonchi Abdomen: soft nontender, nondistended, normal bowel sounds Extremities: no cyanosis, clubbing or edema noted bilaterally Neuro: Cranial nerves II-XII intact, no focal neurological deficits    Get Medicines reviewed and adjusted: Please take all your medications with you for your next visit with your Primary MD  Please request your Primary MD to go over all hospital tests and procedure/radiological results at the follow up. Please ask your Primary MD to get all Hospital records sent to his/her office.  If you experience worsening of your admission symptoms, develop shortness of breath, life threatening emergency, suicidal or homicidal thoughts you must seek medical attention immediately by calling 911 or calling your MD immediately  if symptoms less severe.  You must read complete instructions/literature along with all the possible adverse reactions/side effects for all the Medicines you take and that have been prescribed to you. Take any new Medicines after you have completely understood and accept all the possible adverse reactions/side effects.   Do not drive when taking pain medications.   Do not take more than prescribed Pain, Sleep and Anxiety Medications  Special Instructions: If you have smoked or chewed Tobacco  in the last 2 yrs please stop smoking, stop any regular Alcohol  and or any Recreational drug use.  Wear Seat belts while driving.  Please note  You were cared for by a hospitalist during your hospital stay. Once you are discharged, your primary care physician will handle any further medical issues. Please note that NO REFILLS for any discharge medications will be  authorized once you are discharged, as it is imperative that you return to your primary care physician (or establish a relationship with a primary care physician if you do not have one) for your aftercare needs so that they can reassess your need for medications and monitor your lab values.   The results of significant diagnostics from this hospitalization (including imaging, microbiology, ancillary and laboratory) are listed below for reference.      Procedures/Studies:  CT Head Wo Contrast  Result Date: 11/26/2021 CLINICAL DATA:  Left-sided headache.  Nausea vomiting last  night. EXAM: CT HEAD WITHOUT CONTRAST TECHNIQUE: Contiguous axial images were obtained from the base of the skull through the vertex without intravenous contrast. COMPARISON:  10/24/2021 FINDINGS: Brain: No evidence of acute infarction, hemorrhage, hydrocephalus, extra-axial collection or mass lesion/mass effect. Vascular: No hyperdense vessel or unexpected calcification. Skull: Normal. Negative for fracture or focal lesion. Sinuses/Orbits: Globes and orbits are unremarkable. Visualized sinuses are clear. Other: None. IMPRESSION: Normal unenhanced CT scan of the brain. Electronically Signed   By: Lajean Manes M.D.   On: 11/26/2021 10:05   CT Angio Chest PE W and/or Wo Contrast  Result Date: 11/26/2021 CLINICAL DATA:  Shortness of breath, nausea and vomiting. Left-sided headache. EXAM: CT ANGIOGRAPHY CHEST WITH CONTRAST TECHNIQUE: Multidetector CT imaging of the chest was performed using the standard protocol during bolus administration of intravenous contrast. Multiplanar CT image reconstructions and MIPs were obtained to evaluate the vascular anatomy. CONTRAST:  174m OMNIPAQUE IOHEXOL 350 MG/ML SOLN COMPARISON:  None. FINDINGS: Cardiovascular: Satisfactory opacification of the pulmonary arteries to the segmental level. No evidence of pulmonary embolism. Normal heart size. No pericardial effusion. Mediastinum/Nodes: No enlarged  mediastinal, hilar, or axillary lymph nodes. Thyroid gland, trachea, and esophagus demonstrate no significant findings. Lungs/Pleura: Lungs are clear. No pleural effusion or pneumothorax. Upper Abdomen: No acute abnormality. Musculoskeletal: No chest wall abnormality. No acute or significant osseous findings. Mild dextroscoliosis of the midthoracic spine. Likely hemangioma in the T5 vertebral body. Review of the MIP images confirms the above findings. IMPRESSION: No evidence of pulmonary embolism or acute cardiopulmonary process. Electronically Signed   By: IKeane PoliceD.O.   On: 11/26/2021 10:14   CT ABDOMEN PELVIS W CONTRAST  Result Date: 11/26/2021 CLINICAL DATA:  Left lower quadrant abdominal pain. Nausea and vomiting. Evaluate for diverticulitis. Patient had SI joint effusion on the left 2 weeks ago. EXAM: CT ABDOMEN AND PELVIS WITH CONTRAST TECHNIQUE: Multidetector CT imaging of the abdomen and pelvis was performed using the standard protocol following bolus administration of intravenous contrast. CONTRAST:  1060mOMNIPAQUE IOHEXOL 350 MG/ML SOLN COMPARISON:  June 27, 2021 FINDINGS: Lower chest: No acute abnormality. Hepatobiliary: Multiple tiny low-attenuation lesions in the liver too small to characterize but likely cysts or small hemangiomas. Portal vein is patent. The gallbladder is normal. Pancreas: Unremarkable. No pancreatic ductal dilatation or surrounding inflammatory changes. Spleen: Normal in size without focal abnormality. Adrenals/Urinary Tract: Adrenal glands are normal. A 3 mm stone is seen in the left kidney, unchanged. A probable tiny cyst is seen in the lower pole the right kidney, too small to completely characterize. No hydronephrosis or perinephric stranding. No ureterectasis or ureteral stone. The bladder is normal. Stomach/Bowel: Moderate to marked fecal loading throughout the colon. No colonic diverticuli are identified. No evidence of diverticulitis. The appendix is not  visualized but there is no evidence of appendicitis. Vascular/Lymphatic: Mild calcified atherosclerosis in the nonaneurysmal abdominal aorta. No adenopathy. Reproductive: Uterus and bilateral adnexa are unremarkable. Other: No free air free fluid. The patient is status post recent left SI joint fusion. Hardware is in good position. Postprocedural changes are seen in the overlying soft tissues. Musculoskeletal: Status post recent left SI joint effusion. No other abnormalities. IMPRESSION: 1. Moderate to marked fecal loading throughout the colon. No diverticulosis or diverticulitis identified. 2. Multiple probable tiny cyst in the liver, too small to characterize but unchanged. 3. 3 mm nonobstructive stone in the left kidney. 4. Probable tiny cyst in the right kidney, too small to characterize. 5. Mild calcified atherosclerosis in the nonaneurysmal  aorta. 6. Recent left SI joint effusion. Electronically Signed   By: Dorise Bullion III M.D.   On: 11/26/2021 10:17   CT CORONARY MORPH W/CTA COR W/SCORE W/CA W/CM &/OR WO/CM  Result Date: 11/28/2021 CLINICAL DATA:  70 Year old White Female EXAM: Cardiac/Coronary  CTA TECHNIQUE: The patient was scanned on a [employer]. FINDINGS: Scan was triggered in the descending thoracic aorta. Axial non-contrast 3 mm slices were carried out through the heart. The data set was analyzed on a dedicated work station and scored using the [place]. Gantry rotation speed was 250 msecs and collimation was .6 mm. 0.8 mg of sl NTG was given. The 3D data set was reconstructed in 5% intervals of the 67-82 % of the R-R cycle. Diastolic phases were analyzed on a dedicated work station using MPR, MIP and VRT modes. The patient received 95 cc of contrast. Aorta: Normal size. Descending aortic scattered calcifications. No dissection. Main Pulmonary Artery: Normal size of the pulmonary artery. Aortic Valve:  Tri-leaflet.  No calcifications. Coronary Arteries:  Normal coronary  origin.  Right dominance. Coronary Calcium Score: Left main: 0 Left anterior descending artery: 0 Left circumflex artery: 0 Right coronary artery: 0 Total: 0 Percentile: 1st for age, sex, and race matched control. RCA is a large dominant artery that gives rise to PDA and PLA. There is no significant plaque. Left main is a large artery that gives rise to LAD and LCX arteries. There is no significant plaque. LAD is a large vessel that gives rise to three diagonal branches. There is no significant plaque. LCX is a non-dominant artery with a small OM1. There is no significant plaque. Other findings: Normal pulmonary vein drainage into the left atrium. Normal left atrial appendage without a thrombus. Extra-cardiac findings: See attached radiology report for non-cardiac structures. Patient motion and associated stair step artifact. IMPRESSION: 1. Coronary calcium score of 0. This was 1st percentile for age, sex, and race matched control. 2. Normal coronary origin with right dominance. 3. CAD-RADS 0. No evidence of CAD (0%). Consider non-atherosclerotic causes of chest pain. RECOMMENDATIONS: Coronary artery calcium (CAC) score is a strong predictor of incident coronary heart disease (CHD) and provides predictive information beyond traditional risk factors. CAC scoring is reasonable to use in the decision to withhold, postpone, or initiate statin therapy in intermediate-risk or selected borderline-risk asymptomatic adults (age 25-75 years and LDL-C >=70 to <190 mg/dL) who do not have diabetes or established atherosclerotic cardiovascular disease (ASCVD).* In intermediate-risk (10-year ASCVD risk >=7.5% to <20%) adults or selected borderline-risk (10-year ASCVD risk >=5% to <7.5%) adults in whom a CAC score is measured for the purpose of making a treatment decision the following recommendations have been made: If CAC = 0, it is reasonable to withhold statin therapy and reassess in 5 to 10 years, as long as higher risk  conditions are absent (diabetes mellitus, family history of premature CHD in first degree relatives (males <55 years; females <65 years), cigarette smoking, LDL >=190 mg/dL or other independent risk factors). If CAC is 1 to 99, it is reasonable to initiate statin therapy for patients >=20 years of age. If CAC is >=100 or >=75th percentile, it is reasonable to initiate statin therapy at any age. Cardiology referral should be considered for patients with CAC scores =400 or >=75th percentile. *2018 AHA/ACC/AACVPR/AAPA/ABC/ACPM/ADA/AGS/APhA/ASPC/NLA/PCNA Guideline on the Management of Blood Cholesterol: A Report of the American College of Cardiology/American Heart Association Task Force on Clinical Practice Guidelines. J Am Coll Cardiol. 2019;73(24):3168-3209. Mahesh  Gasper Sells, MD Electronically Signed   By: Rudean Haskell M.D.   On: 11/28/2021 13:29   ECHOCARDIOGRAM COMPLETE  Result Date: 11/27/2021    ECHOCARDIOGRAM REPORT   Patient Name:   MANAL KREUTZER Date of Exam: 11/27/2021 Medical Rec #:  [id number]       Height:       68.0 in Accession #:    [id number]      Weight:       161.3 lb Date of Birth:  03-11-1951       BSA:          1.865 m Patient Age:    65 years        BP:           163/81 mmHg Patient Gender: F               HR:           60 bpm. Exam Location:  Inpatient Procedure: 2D Echo, 3D Echo, Cardiac Doppler and Color Doppler Indications:    R07.9* Chest pain, unspecified  History:        Patient has no prior history of Echocardiogram examinations.                 Signs/Symptoms:Chest Pain; Risk Factors:Hypertension.  Sonographer:    Roseanna Rainbow RDCS Referring Phys: [id number] [facility]  Sonographer Comments: Technically difficult study due to poor echo windows and suboptimal parasternal window. IMPRESSIONS  1. Left ventricular ejection fraction, by estimation, is 60 to 65%. The left ventricle has normal function. The left ventricle has no regional wall motion abnormalities. There is mild  left ventricular hypertrophy. Left ventricular diastolic parameters were normal.  2. Right ventricular systolic function is normal. The right ventricular size is normal. Tricuspid regurgitation signal is inadequate for assessing PA pressure.  3. The mitral valve is normal in structure. Trivial mitral valve regurgitation. No evidence of mitral stenosis.  4. The aortic valve was not well visualized. Aortic valve regurgitation is not visualized. No aortic stenosis is present.  5. The inferior vena cava is normal in size with greater than 50% respiratory variability, suggesting right atrial pressure of 3 mmHg. FINDINGS  Left Ventricle: Left ventricular ejection fraction, by estimation, is 60 to 65%. The left ventricle has normal function. The left ventricle has no regional wall motion abnormalities. The left ventricular internal cavity size was normal in size. There is  mild left ventricular hypertrophy. Left ventricular diastolic parameters were normal. Right Ventricle: The right ventricular size is normal. No increase in right ventricular wall thickness. Right ventricular systolic function is normal. Tricuspid regurgitation signal is inadequate for assessing PA pressure. Left Atrium: Left atrial size was normal in size. Right Atrium: Right atrial size was normal in size. Pericardium: There is no evidence of pericardial effusion. Mitral Valve: The mitral valve is normal in structure. Trivial mitral valve regurgitation. No evidence of mitral valve stenosis. Tricuspid Valve: The tricuspid valve is normal in structure. Tricuspid valve regurgitation is trivial. Aortic Valve: The aortic valve was not well visualized. Aortic valve regurgitation is not visualized. No aortic stenosis is present. Pulmonic Valve: The pulmonic valve was not well visualized. Pulmonic valve regurgitation is not visualized. Aorta: The aortic root and ascending aorta are structurally normal, with no evidence of dilitation. Venous: The inferior vena  cava is normal in size with greater than 50% respiratory variability, suggesting right atrial pressure of 3 mmHg. IAS/Shunts: The interatrial septum was not well visualized.  LEFT  VENTRICLE PLAX 2D LVIDd:         4.70 cm      Diastology LVIDs:         3.30 cm      LV e' medial:    7.80 cm/s LV PW:         0.90 cm      LV E/e' medial:  12.4 LV IVS:        1.10 cm      LV e' lateral:   9.73 cm/s LVOT diam:     1.90 cm      LV E/e' lateral: 10.0 LV SV:         71 LV SV Index:   38 LVOT Area:     2.84 cm  LV Volumes (MOD) LV vol d, MOD A2C: 118.0 ml LV vol d, MOD A4C: 82.3 ml LV vol s, MOD A2C: 39.6 ml LV vol s, MOD A4C: 34.1 ml LV SV MOD A2C:     78.4 ml LV SV MOD A4C:     82.3 ml LV SV MOD BP:      62.7 ml RIGHT VENTRICLE             IVC RV S prime:     11.60 cm/s  IVC diam: 1.90 cm TAPSE (M-mode): 2.6 cm LEFT ATRIUM             Index        RIGHT ATRIUM           Index LA diam:        2.50 cm 1.34 cm/m   RA Area:     11.70 cm LA Vol (A2C):   33.1 ml 17.75 ml/m  RA Volume:   24.40 ml  13.08 ml/m LA Vol (A4C):   31.7 ml 17.00 ml/m LA Biplane Vol: 34.8 ml 18.66 ml/m  AORTIC VALVE LVOT Vmax:   106.00 cm/s LVOT Vmean:  71.800 cm/s LVOT VTI:    0.249 m  AORTA Ao Root diam: 3.10 cm Ao Asc diam:  3.55 cm MITRAL VALVE MV Area (PHT): 3.48 cm    SHUNTS MV Decel Time: 218 msec    Systemic VTI:  0.25 m MR PISA:        0.57 cm   Systemic Diam: 1.90 cm MR PISA Radius: 0.30 cm MV E velocity: 96.90 cm/s MV A velocity: 77.60 cm/s MV E/A ratio:  1.25 Oswaldo Milian MD Electronically signed by Oswaldo Milian MD Signature Date/Time: 11/27/2021/1:04:31 PM    Final       LAB RESULTS: Basic Metabolic Panel: Recent Labs  Lab 11/26/21 0833 11/28/21 0226  NA 132* 130*  K 3.6 3.9  CL 100 100  CO2 22 23  GLUCOSE 93 86  BUN 12 10  CREATININE 0.74 0.78  CALCIUM 9.3 8.7*   Liver Function Tests: Recent Labs  Lab 11/26/21 0833  AST 29  ALT 27  ALKPHOS 76  BILITOT 0.8  PROT 7.4  ALBUMIN 4.5   Recent  Labs  Lab 11/26/21 0833  LIPASE 37   No results for input(s): AMMONIA in the last 168 hours. CBC: Recent Labs  Lab 11/26/21 0833  WBC 5.2  NEUTROABS 3.3  HGB 13.6  HCT 38.2  MCV 87.4  PLT 285   Cardiac Enzymes: Recent Labs  Lab 11/27/21 1103  CKTOTAL 85   BNP: Invalid input(s): POCBNP CBG: No results for input(s): GLUCAP in the last 168 hours.     Disposition and Follow-up: Discharge Instructions  Diet - low sodium heart healthy   Complete by: As directed    If the dressing is still on your incision site when you go home, remove it on the third day after your surgery date. Remove dressing if it begins to fall off, or if it is dirty or damaged before the third day.   Complete by: As directed    Increase activity slowly   Complete by: As directed         DISPOSITION: Home   DISCHARGE FOLLOW-UP  Follow-up Information     Magalski, Leonette Monarch, MD. Schedule an appointment as soon as possible for a visit in 2 week(s).   Specialty: Student Why: for hospital follow-up Contact information: [place] [address] [phone]         Dawley, Theodoro Doing, DO. Schedule an appointment as soon as possible for a visit in 2 week(s).   Why: for hospital follow-up Contact information: [place] [address] [phone]                  Time coordinating discharge:   41mns   Signed:   REstill CottaM.D. [facility] 11/28/2021, 2:18 PM

## 2021-11-28 NOTE — Plan of Care (Signed)

## 2021-11-28 NOTE — Progress Notes (Signed)
[facility]                                                                              Patient Demographics  Alyssa [name], is a 70 y.o. female, DOB - Mar 30, 1951, WVP:[id number]  Admit date - 11/26/2021   Admitting Physician Karmen Bongo, MD  Outpatient Primary MD for the patient is [facility], Leonette Monarch, MD  Outpatient specialists:   LOS - 1  days   Medical records reviewed and are as summarized below:    Chief Complaint  Patient presents with   Nausea       Brief summary   Patient is a 70 year old female with HTN, not on meds, anxiety, depression, lumbar radiculopathy with recent SI joint fusion 2 weeks ago, chronic hyponatremia presented with recurrent chest pains.  Patient reported that symptoms started several days after her SI joint fusion surgery.  She started to experience episodes of tingling sensation in the face, neck and aching chest pains associated with nausea and shortness of breath episode lasting 20 to 30 minutes and spontaneously resolved.  She can get as many as 1-2 episodes in a day however daily for depression she experienced 3 such episodes and on the night of admission, had a severe episode which woke her up and she could not sleep. 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  Patient has been following up with her PCP and her nephrologist.  UA positive for ketones. Patient was admitted for chest pain rule out and dehydration  Assessment & Plan    Principal Problem: Atypical chest pain, borderline elevation of troponins -Risk factors include hypertension, not on meds.  Hyperlipidemia, LDL 125 -Troponin 34-40--86 -nuclear medicine stress test was ordered however patient could not do it due to " allergic reaction", unclear etiology.  Per patient's request, stress test canceled. -2D echo showed EF of 60 to 65%, no R WMA -CT angiogram chest was negative for PE or acute cardiopulmonary process  -Cardiology following,  plan for cardiac CT today  Active Problems: Anxiety, depression -Continue Lexapro,  -given the history of rash, itching and acute anxiety, added Atarax as needed, patient agreeable with the plan   Recent SI fusion surgery 2 weeks ago with symptoms of neuropathy and radiculopathy -Per patient, she still is having symptoms of pain to the gluteus muscle, pins-and-needles feeling etc.  -she has been on Lyrica in the past and was not able to tolerate it, will continue Neurontin, added Cymbalta, 30 mg daily, can titrate up to 60 mg daily for pain/neuropathy. -Appreciate neurosurgery follow-up by Dr. Reatha Armour and recommendations   Recent nausea and vomiting, dehydration -Currently improving, continue Zofran as needed -UA on admission showed ketones, continue IV fluid hydration  Chronic hyponatremia -Nausea and vomiting prior to admission.   -UA positive for ketones, placed on IV fluid hydration, no further nausea vomiting --tolerating diet.  Hx constipation, SIBO -B12 normal, 1344, follow folate -Outpatient follow-up with Dr. Bryan Lemma -Continue bowel regimen  Hypothyroidism -Continue Synthroid  Myalgias and arthralgias -Intermittent complaints, CK normal, patient has followed up with her PCP, extensive work-up including ESR, CRP, RA factor, cortisol normal, also has intermittent  rash, currently not present -Recommended outpatient follow-up with rheumatology  Elevated BP readings Not on any antihypertensives outpatient, continue hydralazine ordered parameters - will follow closely, may need to add antihypertensive at discharge   Code Status: Full CODE STATUS DVT Prophylaxis:  enoxaparin (LOVENOX) injection 40 mg Start: 11/26/21 1715   Level of Care: Level of care: Telemetry Medical Family Communication: Discussed all imaging results, lab results, explained to the patient and husband at the bedside on 12/19.   Disposition Plan:     Status is: Inpatient  Pending cardiac CT and  further work-up by cardiology.   Time Spent in minutes 25 minutes  Procedures:  2D echo Per patient, could not tolerate the stress test  Consultants:   Cardiology Neurosurgery  Antimicrobials:   Anti-infectives (From admission, onward)    None          Medications  Scheduled Meds:  aspirin EC  81 mg Oral Daily   atorvastatin  10 mg Oral Daily   enoxaparin (LOVENOX) injection  40 mg Subcutaneous Q24H   escitalopram  10 mg Oral q morning   estradiol   Vaginal Q M,W,F   gabapentin  300 mg Oral TID   levothyroxine  112 mcg Oral Once per day on Mon Tue Wed Thu Fri Sat   And   [START ON 12/03/2021] levothyroxine  168 mcg Oral Once per day on Sun   nitroGLYCERIN       zolpidem  5 mg Oral QHS   Continuous Infusions:   PRN Meds:.acetaminophen, hydrALAZINE, hydrOXYzine, ketorolac, magnesium hydroxide, morphine injection, ondansetron (ZOFRAN) IV, oxyCODONE-acetaminophen **AND** oxyCODONE      Subjective:   Alyssa [name] was seen and examined today.  No acute complaints this morning, no acute chest pain or shortness of breath.  Pain is controlled in the back..     Objective:   Vitals:   11/27/21 1111 11/27/21 1931 11/28/21 0325 11/28/21 1112  BP: (!) 170/77 (!) 152/73 (!) 153/76 (!) 149/73  Pulse: 60 62 89   Resp: 17 18 16 17   Temp: 98.2 F (36.8 C) 98.2 F (36.8 C) 97.7 F (36.5 C) 97.9 F (36.6 C)  TempSrc: Oral Oral Oral Oral  SpO2: 98% 100% 99% 99%  Weight:   73.1 kg   Height:        Intake/Output Summary (Last 24 hours) at 11/28/2021 1359 Last data filed at 11/28/2021 1312 Gross per 24 hour  Intake 1635.59 ml  Output --  Net 1635.59 ml     Wt Readings from Last 3 Encounters:  11/28/21 73.1 kg  09/25/21 77.6 kg  09/12/21 78.2 kg   Physical Exam General: Alert and oriented x 3, NAD Cardiovascular: S1 S2 clear, RRR. No pedal edema b/l Respiratory: CTAB, no wheezing, Gastrointestinal: Soft, nontender, nondistended, NBS Ext: no pedal  edema bilaterally Psych: Normal affect and demeanor, alert and oriented x3     Data Reviewed:  I have personally reviewed following labs and imaging studies  Micro Results Recent Results (from the past 240 hour(s))  Resp Panel by RT-PCR (Flu A&B, Covid) Nasopharyngeal Swab     Status: None   Collection Time: 11/26/21 12:21 PM   Specimen: Nasopharyngeal Swab; Nasopharyngeal(NP) swabs in vial transport medium  Result Value Ref Range Status   SARS Coronavirus 2 by RT PCR NEGATIVE NEGATIVE Final    Comment: (NOTE) SARS-CoV-2 target nucleic acids are NOT DETECTED.  The SARS-CoV-2 RNA is generally detectable in upper respiratory specimens during the acute phase of infection. The lowest  concentration of SARS-CoV-2 viral copies this assay can detect is 138 copies/mL. A negative result does not preclude SARS-Cov-2 infection and should not be used as the sole basis for treatment or other patient management decisions. A negative result may occur with  improper specimen collection/handling, submission of specimen other than nasopharyngeal swab, presence of viral mutation(s) within the areas targeted by this assay, and inadequate number of viral copies(<138 copies/mL). A negative result must be combined with clinical observations, patient history, and epidemiological information. The expected result is Negative.  Fact Sheet for Patients:  EntrepreneurPulse.com.au  Fact Sheet for Healthcare Providers:  IncredibleEmployment.be  This test is no t yet approved or cleared by the [place] FDA and  has been authorized for detection and/or diagnosis of SARS-CoV-2 by FDA under an Emergency Use Authorization (EUA). This EUA will remain  in effect (meaning this test can be used) for the duration of the COVID-19 declaration under Section 564(b)(1) of the Act, 21 U.S.C.section 360bbb-3(b)(1), unless the authorization is terminated  or revoked sooner.        Influenza A by PCR NEGATIVE NEGATIVE Final   Influenza B by PCR NEGATIVE NEGATIVE Final    Comment: (NOTE) The Xpert Xpress SARS-CoV-2/FLU/RSV plus assay is intended as an aid in the diagnosis of influenza from Nasopharyngeal swab specimens and should not be used as a sole basis for treatment. Nasal washings and aspirates are unacceptable for Xpert Xpress SARS-CoV-2/FLU/RSV testing.  Fact Sheet for Patients: EntrepreneurPulse.com.au  Fact Sheet for Healthcare Providers: IncredibleEmployment.be  This test is not yet approved or cleared by the [place] FDA and has been authorized for detection and/or diagnosis of SARS-CoV-2 by FDA under an Emergency Use Authorization (EUA). This EUA will remain in effect (meaning this test can be used) for the duration of the COVID-19 declaration under Section 564(b)(1) of the Act, 21 U.S.C. section 360bbb-3(b)(1), unless the authorization is terminated or revoked.  Performed at [facility], [address], [place], [place] [address]     Radiology Reports CT Head Wo Contrast  Result Date: 11/26/2021 CLINICAL DATA:  Left-sided headache.  Nausea vomiting last night. EXAM: CT HEAD WITHOUT CONTRAST TECHNIQUE: Contiguous axial images were obtained from the base of the skull through the vertex without intravenous contrast. COMPARISON:  10/24/2021 FINDINGS: Brain: No evidence of acute infarction, hemorrhage, hydrocephalus, extra-axial collection or mass lesion/mass effect. Vascular: No hyperdense vessel or unexpected calcification. Skull: Normal. Negative for fracture or focal lesion. Sinuses/Orbits: Globes and orbits are unremarkable. Visualized sinuses are clear. Other: None. IMPRESSION: Normal unenhanced CT scan of the brain. Electronically Signed   By: Lajean Manes M.D.   On: 11/26/2021 10:05   CT Angio Chest PE W and/or Wo Contrast  Result Date: 11/26/2021 CLINICAL DATA:  Shortness of breath,  nausea and vomiting. Left-sided headache. EXAM: CT ANGIOGRAPHY CHEST WITH CONTRAST TECHNIQUE: Multidetector CT imaging of the chest was performed using the standard protocol during bolus administration of intravenous contrast. Multiplanar CT image reconstructions and MIPs were obtained to evaluate the vascular anatomy. CONTRAST:  170m OMNIPAQUE IOHEXOL 350 MG/ML SOLN COMPARISON:  None. FINDINGS: Cardiovascular: Satisfactory opacification of the pulmonary arteries to the segmental level. No evidence of pulmonary embolism. Normal heart size. No pericardial effusion. Mediastinum/Nodes: No enlarged mediastinal, hilar, or axillary lymph nodes. Thyroid gland, trachea, and esophagus demonstrate no significant findings. Lungs/Pleura: Lungs are clear. No pleural effusion or pneumothorax. Upper Abdomen: No acute abnormality. Musculoskeletal: No chest wall abnormality. No acute or significant osseous findings. Mild dextroscoliosis  of the midthoracic spine. Likely hemangioma in the T5 vertebral body. Review of the MIP images confirms the above findings. IMPRESSION: No evidence of pulmonary embolism or acute cardiopulmonary process. Electronically Signed   By: Keane Police D.O.   On: 11/26/2021 10:14   CT ABDOMEN PELVIS W CONTRAST  Result Date: 11/26/2021 CLINICAL DATA:  Left lower quadrant abdominal pain. Nausea and vomiting. Evaluate for diverticulitis. Patient had SI joint effusion on the left 2 weeks ago. EXAM: CT ABDOMEN AND PELVIS WITH CONTRAST TECHNIQUE: Multidetector CT imaging of the abdomen and pelvis was performed using the standard protocol following bolus administration of intravenous contrast. CONTRAST:  139m OMNIPAQUE IOHEXOL 350 MG/ML SOLN COMPARISON:  June 27, 2021 FINDINGS: Lower chest: No acute abnormality. Hepatobiliary: Multiple tiny low-attenuation lesions in the liver too small to characterize but likely cysts or small hemangiomas. Portal vein is patent. The gallbladder is normal. Pancreas:  Unremarkable. No pancreatic ductal dilatation or surrounding inflammatory changes. Spleen: Normal in size without focal abnormality. Adrenals/Urinary Tract: Adrenal glands are normal. A 3 mm stone is seen in the left kidney, unchanged. A probable tiny cyst is seen in the lower pole the right kidney, too small to completely characterize. No hydronephrosis or perinephric stranding. No ureterectasis or ureteral stone. The bladder is normal. Stomach/Bowel: Moderate to marked fecal loading throughout the colon. No colonic diverticuli are identified. No evidence of diverticulitis. The appendix is not visualized but there is no evidence of appendicitis. Vascular/Lymphatic: Mild calcified atherosclerosis in the nonaneurysmal abdominal aorta. No adenopathy. Reproductive: Uterus and bilateral adnexa are unremarkable. Other: No free air free fluid. The patient is status post recent left SI joint fusion. Hardware is in good position. Postprocedural changes are seen in the overlying soft tissues. Musculoskeletal: Status post recent left SI joint effusion. No other abnormalities. IMPRESSION: 1. Moderate to marked fecal loading throughout the colon. No diverticulosis or diverticulitis identified. 2. Multiple probable tiny cyst in the liver, too small to characterize but unchanged. 3. 3 mm nonobstructive stone in the left kidney. 4. Probable tiny cyst in the right kidney, too small to characterize. 5. Mild calcified atherosclerosis in the nonaneurysmal aorta. 6. Recent left SI joint effusion. Electronically Signed   By: DDorise BullionIII M.D.   On: 11/26/2021 10:17   CT CORONARY MORPH W/CTA COR W/SCORE W/CA W/CM &/OR WO/CM  Result Date: 11/28/2021 CLINICAL DATA:  70Year old White Female EXAM: Cardiac/Coronary  CTA TECHNIQUE: The patient was scanned on a [employer] FINDINGS: Scan was triggered in the descending thoracic aorta. Axial non-contrast 3 mm slices were carried out through the heart. The data set was  analyzed on a dedicated work station and scored using the [employer] G[employer] rotation speed was 250 msecs and collimation was .6 mm. 0.8 mg of sl NTG was given. The 3D data set was reconstructed in 5% intervals of the 67-82 % of the R-R cycle. Diastolic phases were analyzed on a dedicated work station using MPR, MIP and VRT modes. The patient received 95 cc of contrast. Aorta: Normal size. Descending aortic scattered calcifications. No dissection. Main Pulmonary Artery: Normal size of the pulmonary artery. Aortic Valve:  Tri-leaflet.  No calcifications. Coronary Arteries:  Normal coronary origin.  Right dominance. Coronary Calcium Score: Left main: 0 Left anterior descending artery: 0 Left circumflex artery: 0 Right coronary artery: 0 Total: 0 Percentile: 1st for age, sex, and race matched control. RCA is a large dominant artery that gives rise to PDA and PLA. There is no  significant plaque. Left main is a large artery that gives rise to LAD and LCX arteries. There is no significant plaque. LAD is a large vessel that gives rise to three diagonal branches. There is no significant plaque. LCX is a non-dominant artery with a small OM1. There is no significant plaque. Other findings: Normal pulmonary vein drainage into the left atrium. Normal left atrial appendage without a thrombus. Extra-cardiac findings: See attached radiology report for non-cardiac structures. Patient motion and associated stair step artifact. IMPRESSION: 1. Coronary calcium score of 0. This was 1st percentile for age, sex, and race matched control. 2. Normal coronary origin with right dominance. 3. CAD-RADS 0. No evidence of CAD (0%). Consider non-atherosclerotic causes of chest pain. RECOMMENDATIONS: Coronary artery calcium (CAC) score is a strong predictor of incident coronary heart disease (CHD) and provides predictive information beyond traditional risk factors. CAC scoring is reasonable to use in the decision to withhold, postpone, or  initiate statin therapy in intermediate-risk or selected borderline-risk asymptomatic adults (age 65-75 years and LDL-C >=70 to <190 mg/dL) who do not have diabetes or established atherosclerotic cardiovascular disease (ASCVD).* In intermediate-risk (10-year ASCVD risk >=7.5% to <20%) adults or selected borderline-risk (10-year ASCVD risk >=5% to <7.5%) adults in whom a CAC score is measured for the purpose of making a treatment decision the following recommendations have been made: If CAC = 0, it is reasonable to withhold statin therapy and reassess in 5 to 10 years, as long as higher risk conditions are absent (diabetes mellitus, family history of premature CHD in first degree relatives (males <55 years; females <65 years), cigarette smoking, LDL >=190 mg/dL or other independent risk factors). If CAC is 1 to 99, it is reasonable to initiate statin therapy for patients >=39 years of age. If CAC is >=100 or >=75th percentile, it is reasonable to initiate statin therapy at any age. Cardiology referral should be considered for patients with CAC scores =400 or >=75th percentile. *2018 AHA/ACC/AACVPR/AAPA/ABC/ACPM/ADA/AGS/APhA/ASPC/NLA/PCNA Guideline on the Management of Blood Cholesterol: A Report of the American College of Cardiology/American Heart Association Task Force on Clinical Practice Guidelines. J Am Coll Cardiol. 2019;73(24):3168-3209. Rudean Haskell, MD Electronically Signed   By: Rudean Haskell M.D.   On: 11/28/2021 13:29   ECHOCARDIOGRAM COMPLETE  Result Date: 11/27/2021    ECHOCARDIOGRAM REPORT   Patient Name:   XITLALY AULT Date of Exam: 11/27/2021 Medical Rec #:  [id number]       Height:       68.0 in Accession #:    [id number]      Weight:       161.3 lb Date of Birth:  11/19/1951       BSA:          1.865 m Patient Age:    89 years        BP:           163/81 mmHg Patient Gender: F               HR:           60 bpm. Exam Location:  Inpatient Procedure: 2D Echo, 3D Echo, Cardiac  Doppler and Color Doppler Indications:    R07.9* Chest pain, unspecified  History:        Patient has no prior history of Echocardiogram examinations.                 Signs/Symptoms:Chest Pain; Risk Factors:Hypertension.  Sonographer:    [place] Referring Phys: [phone]  PING T ZHANG  Sonographer Comments: Technically difficult study due to poor echo windows and suboptimal parasternal window. IMPRESSIONS  1. Left ventricular ejection fraction, by estimation, is 60 to 65%. The left ventricle has normal function. The left ventricle has no regional wall motion abnormalities. There is mild left ventricular hypertrophy. Left ventricular diastolic parameters were normal.  2. Right ventricular systolic function is normal. The right ventricular size is normal. Tricuspid regurgitation signal is inadequate for assessing PA pressure.  3. The mitral valve is normal in structure. Trivial mitral valve regurgitation. No evidence of mitral stenosis.  4. The aortic valve was not well visualized. Aortic valve regurgitation is not visualized. No aortic stenosis is present.  5. The inferior vena cava is normal in size with greater than 50% respiratory variability, suggesting right atrial pressure of 3 mmHg. FINDINGS  Left Ventricle: Left ventricular ejection fraction, by estimation, is 60 to 65%. The left ventricle has normal function. The left ventricle has no regional wall motion abnormalities. The left ventricular internal cavity size was normal in size. There is  mild left ventricular hypertrophy. Left ventricular diastolic parameters were normal. Right Ventricle: The right ventricular size is normal. No increase in right ventricular wall thickness. Right ventricular systolic function is normal. Tricuspid regurgitation signal is inadequate for assessing PA pressure. Left Atrium: Left atrial size was normal in size. Right Atrium: Right atrial size was normal in size. Pericardium: There is no evidence of pericardial effusion.  Mitral Valve: The mitral valve is normal in structure. Trivial mitral valve regurgitation. No evidence of mitral valve stenosis. Tricuspid Valve: The tricuspid valve is normal in structure. Tricuspid valve regurgitation is trivial. Aortic Valve: The aortic valve was not well visualized. Aortic valve regurgitation is not visualized. No aortic stenosis is present. Pulmonic Valve: The pulmonic valve was not well visualized. Pulmonic valve regurgitation is not visualized. Aorta: The aortic root and ascending aorta are structurally normal, with no evidence of dilitation. Venous: The inferior vena cava is normal in size with greater than 50% respiratory variability, suggesting right atrial pressure of 3 mmHg. IAS/Shunts: The interatrial septum was not well visualized.  LEFT VENTRICLE PLAX 2D LVIDd:         4.70 cm      Diastology LVIDs:         3.30 cm      LV e' medial:    7.80 cm/s LV PW:         0.90 cm      LV E/e' medial:  12.4 LV IVS:        1.10 cm      LV e' lateral:   9.73 cm/s LVOT diam:     1.90 cm      LV E/e' lateral: 10.0 LV SV:         71 LV SV Index:   38 LVOT Area:     2.84 cm  LV Volumes (MOD) LV vol d, MOD A2C: 118.0 ml LV vol d, MOD A4C: 82.3 ml LV vol s, MOD A2C: 39.6 ml LV vol s, MOD A4C: 34.1 ml LV SV MOD A2C:     78.4 ml LV SV MOD A4C:     82.3 ml LV SV MOD BP:      62.7 ml RIGHT VENTRICLE             IVC RV S prime:     11.60 cm/s  IVC diam: 1.90 cm TAPSE (M-mode): 2.6 cm LEFT ATRIUM  Index        RIGHT ATRIUM           Index LA diam:        2.50 cm 1.34 cm/m   RA Area:     11.70 cm LA Vol (A2C):   33.1 ml 17.75 ml/m  RA Volume:   24.40 ml  13.08 ml/m LA Vol (A4C):   31.7 ml 17.00 ml/m LA Biplane Vol: 34.8 ml 18.66 ml/m  AORTIC VALVE LVOT Vmax:   106.00 cm/s LVOT Vmean:  71.800 cm/s LVOT VTI:    0.249 m  AORTA Ao Root diam: 3.10 cm Ao Asc diam:  3.55 cm MITRAL VALVE MV Area (PHT): 3.48 cm    SHUNTS MV Decel Time: 218 msec    Systemic VTI:  0.25 m MR PISA:        0.57 cm    Systemic Diam: 1.90 cm MR PISA Radius: 0.30 cm MV E velocity: 96.90 cm/s MV A velocity: 77.60 cm/s MV E/A ratio:  1.25 Oswaldo Milian MD Electronically signed by Oswaldo Milian MD Signature Date/Time: 11/27/2021/1:04:31 PM    Final     Lab Data:  CBC: Recent Labs  Lab 11/26/21 0833  WBC 5.2  NEUTROABS 3.3  HGB 13.6  HCT 38.2  MCV 87.4  PLT 572   Basic Metabolic Panel: Recent Labs  Lab 11/26/21 0833 11/28/21 0226  NA 132* 130*  K 3.6 3.9  CL 100 100  CO2 22 23  GLUCOSE 93 86  BUN 12 10  CREATININE 0.74 0.78  CALCIUM 9.3 8.7*   GFR: Estimated Creatinine Clearance: 67.9 mL/min (by C-G formula based on SCr of 0.78 mg/dL). Liver Function Tests: Recent Labs  Lab 11/26/21 0833  AST 29  ALT 27  ALKPHOS 76  BILITOT 0.8  PROT 7.4  ALBUMIN 4.5   Recent Labs  Lab 11/26/21 0833  LIPASE 37   No results for input(s): AMMONIA in the last 168 hours. Coagulation Profile: No results for input(s): INR, PROTIME in the last 168 hours. Cardiac Enzymes: Recent Labs  Lab 11/27/21 1103  CKTOTAL 85   BNP (last 3 results) No results for input(s): PROBNP in the last 8760 hours. HbA1C: No results for input(s): HGBA1C in the last 72 hours. CBG: No results for input(s): GLUCAP in the last 168 hours. Lipid Profile: Recent Labs    11/27/21 0204  CHOL 199  HDL 68  LDLCALC 125*  TRIG 30  CHOLHDL 2.9   Thyroid Function Tests: Recent Labs    11/28/21 0226  TSH 9.150*   Anemia Panel: Recent Labs    11/27/21 1103  VITAMINB12 1,344*  FOLATE 43.3   Urine analysis:    Component Value Date/Time   COLORURINE YELLOW 11/26/2021 [place] 11/26/2021 1057   LABSPEC 1.010 11/26/2021 1057   PHURINE 7.5 11/26/2021 1057   GLUCOSEU NEGATIVE 11/26/2021 1057   HGBUR NEGATIVE 11/26/2021 1057   BILIRUBINUR NEGATIVE 11/26/2021 1057   BILIRUBINUR negative 09/25/2021 1534   KETONESUR 40 (A) 11/26/2021 1057   PROTEINUR NEGATIVE 11/26/2021 1057    UROBILINOGEN 0.2 09/25/2021 1534   NITRITE NEGATIVE 11/26/2021 1057   LEUKOCYTESUR NEGATIVE 11/26/2021 1057     Riggins Cisek M.D. [facility] 11/28/2021, 1:59 PM  Available via Epic secure chat 7am-7pm After 7 pm, please refer to night coverage provider listed on amion.

## 2021-11-28 NOTE — Progress Notes (Signed)
Progress Note  Patient Name: Alyssa [name] Date of Encounter: 11/28/2021  Primary Cardiologist: None   Subjective   Patient seen and examined at her bedside.  She is anxious for her procedure.  She offers no complaints at this time.  Inpatient Medications    Scheduled Meds:  aspirin EC  81 mg Oral Daily   atorvastatin  10 mg Oral Daily   enoxaparin (LOVENOX) injection  40 mg Subcutaneous Q24H   escitalopram  10 mg Oral q morning   estradiol   Vaginal Q M,W,F   gabapentin  300 mg Oral TID   levothyroxine  112 mcg Oral Once per day on Mon Tue Wed Thu Fri Sat   And   [START ON 12/03/2021] levothyroxine  168 mcg Oral Once per day on Sun   zolpidem  5 mg Oral QHS   Continuous Infusions:  sodium chloride Stopped (11/28/21 0936)   PRN Meds: acetaminophen, hydrALAZINE, hydrOXYzine, ketorolac, magnesium hydroxide, morphine injection, ondansetron (ZOFRAN) IV, oxyCODONE-acetaminophen **AND** oxyCODONE   Vital Signs    Vitals:   11/27/21 1111 11/27/21 1931 11/28/21 0325 11/28/21 1112  BP: (!) 170/77 (!) 152/73 (!) 153/76 (!) 149/73  Pulse: 60 62 89   Resp: 17 18 16 17   Temp: 98.2 F (36.8 C) 98.2 F (36.8 C) 97.7 F (36.5 C) 97.9 F (36.6 C)  TempSrc: Oral Oral Oral Oral  SpO2: 98% 100% 99% 99%  Weight:   73.1 kg   Height:        Intake/Output Summary (Last 24 hours) at 11/28/2021 1131 Last data filed at 11/28/2021 2440 Gross per 24 hour  Intake 1635.59 ml  Output 600 ml  Net 1035.59 ml   Filed Weights   11/26/21 0741 11/27/21 0037 11/28/21 0325  Weight: 74.4 kg 73.2 kg 73.1 kg    Telemetry    Sinus bradycardia- Personally Reviewed  ECG    None today- Personally Reviewed  Physical Exam    General: Comfortable, lying in bed Head: Atraumatic, normal size  Eyes: PEERLA, EOMI  Neck: Supple, normal JVD Cardiac: Normal S1, S2; RRR; no murmurs, rubs, or gallops Lungs: Clear to auscultation bilaterally Abd: Soft, nontender, no hepatomegaly  Ext: warm,  no edema Musculoskeletal: No deformities, BUE and BLE strength normal and equal Skin: Warm and dry, no rashes   Neuro: Alert and oriented to person, place, time, and situation, CNII-XII grossly intact, no focal deficits  Psych: Normal mood and affect   Labs    Chemistry Recent Labs  Lab 11/26/21 0833 11/28/21 0226  NA 132* 130*  K 3.6 3.9  CL 100 100  CO2 22 23  GLUCOSE 93 86  BUN 12 10  CREATININE 0.74 0.78  CALCIUM 9.3 8.7*  PROT 7.4  --   ALBUMIN 4.5  --   AST 29  --   ALT 27  --   ALKPHOS 76  --   BILITOT 0.8  --   GFRNONAA >60 >60  ANIONGAP 10 7     Hematology Recent Labs  Lab 11/26/21 0833  WBC 5.2  RBC 4.37  HGB 13.6  HCT 38.2  MCV 87.4  MCH 31.1  MCHC 35.6  RDW 12.1  PLT 285    Cardiac EnzymesNo results for input(s): TROPONINI in the last 168 hours. No results for input(s): TROPIPOC in the last 168 hours.   BNP Recent Labs  Lab 11/26/21 0833  BNP 56.6     DDimer  Recent Labs  Lab 11/27/21 1459  DDIMER 0.40  Radiology    ECHOCARDIOGRAM COMPLETE  Result Date: 11/27/2021    ECHOCARDIOGRAM REPORT   Patient Name:   MARIELOUISE AMEY Date of Exam: 11/27/2021 Medical Rec #:  [id number]       Height:       68.0 in Accession #:    [id number]      Weight:       161.3 lb Date of Birth:  1951/09/09       BSA:          1.865 m Patient Age:    70 years        BP:           163/81 mmHg Patient Gender: F               HR:           60 bpm. Exam Location:  Inpatient Procedure: 2D Echo, 3D Echo, Cardiac Doppler and Color Doppler Indications:    R07.9* Chest pain, unspecified  History:        Patient has no prior history of Echocardiogram examinations.                 Signs/Symptoms:Chest Pain; Risk Factors:Hypertension.  Sonographer:    Roseanna Rainbow RDCS Referring Phys: [id number] [facility]  Sonographer Comments: Technically difficult study due to poor echo windows and suboptimal parasternal window. IMPRESSIONS  1. Left ventricular ejection fraction, by  estimation, is 60 to 65%. The left ventricle has normal function. The left ventricle has no regional wall motion abnormalities. There is mild left ventricular hypertrophy. Left ventricular diastolic parameters were normal.  2. Right ventricular systolic function is normal. The right ventricular size is normal. Tricuspid regurgitation signal is inadequate for assessing PA pressure.  3. The mitral valve is normal in structure. Trivial mitral valve regurgitation. No evidence of mitral stenosis.  4. The aortic valve was not well visualized. Aortic valve regurgitation is not visualized. No aortic stenosis is present.  5. The inferior vena cava is normal in size with greater than 50% respiratory variability, suggesting right atrial pressure of 3 mmHg. FINDINGS  Left Ventricle: Left ventricular ejection fraction, by estimation, is 60 to 65%. The left ventricle has normal function. The left ventricle has no regional wall motion abnormalities. The left ventricular internal cavity size was normal in size. There is  mild left ventricular hypertrophy. Left ventricular diastolic parameters were normal. Right Ventricle: The right ventricular size is normal. No increase in right ventricular wall thickness. Right ventricular systolic function is normal. Tricuspid regurgitation signal is inadequate for assessing PA pressure. Left Atrium: Left atrial size was normal in size. Right Atrium: Right atrial size was normal in size. Pericardium: There is no evidence of pericardial effusion. Mitral Valve: The mitral valve is normal in structure. Trivial mitral valve regurgitation. No evidence of mitral valve stenosis. Tricuspid Valve: The tricuspid valve is normal in structure. Tricuspid valve regurgitation is trivial. Aortic Valve: The aortic valve was not well visualized. Aortic valve regurgitation is not visualized. No aortic stenosis is present. Pulmonic Valve: The pulmonic valve was not well visualized. Pulmonic valve regurgitation is  not visualized. Aorta: The aortic root and ascending aorta are structurally normal, with no evidence of dilitation. Venous: The inferior vena cava is normal in size with greater than 50% respiratory variability, suggesting right atrial pressure of 3 mmHg. IAS/Shunts: The interatrial septum was not well visualized.  LEFT VENTRICLE PLAX 2D LVIDd:         4.70 cm  Diastology LVIDs:         3.30 cm      LV e' medial:    7.80 cm/s LV PW:         0.90 cm      LV E/e' medial:  12.4 LV IVS:        1.10 cm      LV e' lateral:   9.73 cm/s LVOT diam:     1.90 cm      LV E/e' lateral: 10.0 LV SV:         71 LV SV Index:   38 LVOT Area:     2.84 cm  LV Volumes (MOD) LV vol d, MOD A2C: 118.0 ml LV vol d, MOD A4C: 82.3 ml LV vol s, MOD A2C: 39.6 ml LV vol s, MOD A4C: 34.1 ml LV SV MOD A2C:     78.4 ml LV SV MOD A4C:     82.3 ml LV SV MOD BP:      62.7 ml RIGHT VENTRICLE             IVC RV S prime:     11.60 cm/s  IVC diam: 1.90 cm TAPSE (M-mode): 2.6 cm LEFT ATRIUM             Index        RIGHT ATRIUM           Index LA diam:        2.50 cm 1.34 cm/m   RA Area:     11.70 cm LA Vol (A2C):   33.1 ml 17.75 ml/m  RA Volume:   24.40 ml  13.08 ml/m LA Vol (A4C):   31.7 ml 17.00 ml/m LA Biplane Vol: 34.8 ml 18.66 ml/m  AORTIC VALVE LVOT Vmax:   106.00 cm/s LVOT Vmean:  71.800 cm/s LVOT VTI:    0.249 m  AORTA Ao Root diam: 3.10 cm Ao Asc diam:  3.55 cm MITRAL VALVE MV Area (PHT): 3.48 cm    SHUNTS MV Decel Time: 218 msec    Systemic VTI:  0.25 m MR PISA:        0.57 cm   Systemic Diam: 1.90 cm MR PISA Radius: 0.30 cm MV E velocity: 96.90 cm/s MV A velocity: 77.60 cm/s MV E/A ratio:  1.25 Oswaldo Milian MD Electronically signed by Oswaldo Milian MD Signature Date/Time: 11/27/2021/1:04:31 PM    Final     Cardiac Studies   Echocardiogram 11/27/2021 IMPRESSIONS     1. Left ventricular ejection fraction, by estimation, is 60 to 65%. The  left ventricle has normal function. The left ventricle has no regional   wall motion abnormalities. There is mild left ventricular hypertrophy.  Left ventricular diastolic parameters  were normal.   2. Right ventricular systolic function is normal. The right ventricular  size is normal. Tricuspid regurgitation signal is inadequate for assessing  PA pressure.   3. The mitral valve is normal in structure. Trivial mitral valve  regurgitation. No evidence of mitral stenosis.   4. The aortic valve was not well visualized. Aortic valve regurgitation  is not visualized. No aortic stenosis is present.   5. The inferior vena cava is normal in size with greater than 50%  respiratory variability, suggesting right atrial pressure of 3 mmHg.   FINDINGS   Left Ventricle: Left ventricular ejection fraction, by estimation, is 60  to 65%. The left ventricle has normal function. The left ventricle has no  regional wall motion abnormalities. The left  ventricular internal cavity  size was normal in size. There is   mild left ventricular hypertrophy. Left ventricular diastolic parameters  were normal.   Right Ventricle: The right ventricular size is normal. No increase in  right ventricular wall thickness. Right ventricular systolic function is  normal. Tricuspid regurgitation signal is inadequate for assessing PA  pressure.   Left Atrium: Left atrial size was normal in size.   Right Atrium: Right atrial size was normal in size.   Pericardium: There is no evidence of pericardial effusion.   Mitral Valve: The mitral valve is normal in structure. Trivial mitral  valve regurgitation. No evidence of mitral valve stenosis.   Tricuspid Valve: The tricuspid valve is normal in structure. Tricuspid  valve regurgitation is trivial.   Aortic Valve: The aortic valve was not well visualized. Aortic valve  regurgitation is not visualized. No aortic stenosis is present.   Pulmonic Valve: The pulmonic valve was not well visualized. Pulmonic valve  regurgitation is not visualized.    Aorta: The aortic root and ascending aorta are structurally normal, with  no evidence of dilitation.   Venous: The inferior vena cava is normal in size with greater than 50%  respiratory variability, suggesting right atrial pressure of 3 mmHg.   IAS/Shunts: The interatrial septum was not well visualized.       Patient Profile     70 y.o. female with history of anxiety, depression, thyroid cancer status post thyroidectomy, spinal stenosis presented to be evaluated for chest pain  Assessment & Plan    Atypical chest pain -plan for cardiac CT scan today.  The patient is aware about this testing.  She is agreeable still to proceed with this.  Further recommendation postcardiac CTPA  For questions or updates, please contact [facility] Please consult [email or web address] for contact info under Cardiology/STEMI.      Signed, Berniece Salines, DO  11/28/2021, 11:31 AM

## 2021-11-28 NOTE — Progress Notes (Signed)
° °  Providing Compassionate, Quality Care - Together  NEUROSURGERY PROGRESS NOTE   S: No issues overnight. Pain significantly improved  O: EXAM:  BP (!) 149/73 (BP Location: Right Arm)    Pulse 89    Temp 97.9 F (36.6 C) (Oral)    Resp 17    Ht 5\' 8"  (1.727 m)    Wt 73.1 kg Comment: scale b   SpO2 99%    BMI 24.51 kg/m   Awake, alert, oriented x3 PERRL Speech fluent, appropriate  CNs grossly intact  5/5 BUE/BLE   ASSESSMENT:  70 y.o. female with   Left Sacroiliitis  PLAN: - cards workup pending -cont current pain regimen, will cont to follow while inpatient and will setup a new outpatient appointment    Thank you for allowing me to participate in this patient's care.  Please do not hesitate to call with questions or concerns.   Elwin Sleight, [facility] Cell: [phone]

## 2021-11-29 LAB — VITAMIN B1: Vitamin B1 (Thiamine): 85.6 nmol/L (ref 66.5–200.0)

## 2021-12-07 ENCOUNTER — Telehealth (HOSPITAL_BASED_OUTPATIENT_CLINIC_OR_DEPARTMENT_OTHER): Payer: Self-pay

## 2021-12-12 ENCOUNTER — Inpatient Hospital Stay (HOSPITAL_BASED_OUTPATIENT_CLINIC_OR_DEPARTMENT_OTHER): Admission: RE | Admit: 2021-12-12 | Payer: Medicare Other | Source: Ambulatory Visit

## 2021-12-12 DIAGNOSIS — Z1231 Encounter for screening mammogram for malignant neoplasm of breast: Secondary | ICD-10-CM

## 2021-12-18 ENCOUNTER — Encounter (HOSPITAL_BASED_OUTPATIENT_CLINIC_OR_DEPARTMENT_OTHER): Payer: Self-pay

## 2021-12-18 ENCOUNTER — Ambulatory Visit (HOSPITAL_BASED_OUTPATIENT_CLINIC_OR_DEPARTMENT_OTHER)
Admission: RE | Admit: 2021-12-18 | Discharge: 2021-12-18 | Disposition: A | Discharge status: Home / Self Care | Payer: Medicare Other | Source: Ambulatory Visit | Attending: Family Medicine | Admitting: Family Medicine

## 2021-12-18 ENCOUNTER — Other Ambulatory Visit: Payer: Self-pay

## 2021-12-18 DIAGNOSIS — Z1231 Encounter for screening mammogram for malignant neoplasm of breast: Secondary | ICD-10-CM | POA: Diagnosis not present

## 2022-03-13 ENCOUNTER — Ambulatory Visit: Payer: Medicare Other | Admitting: Internal Medicine

## 2022-04-17 ENCOUNTER — Ambulatory Visit: Payer: Medicare Other | Admitting: Podiatry

## 2022-06-04 ENCOUNTER — Encounter (HOSPITAL_BASED_OUTPATIENT_CLINIC_OR_DEPARTMENT_OTHER): Payer: Self-pay

## 2022-06-04 ENCOUNTER — Ambulatory Visit (INDEPENDENT_AMBULATORY_CARE_PROVIDER_SITE_OTHER)
Admission: EM | Admit: 2022-06-04 | Discharge: 2022-06-04 | Disposition: A | Discharge status: Home / Self Care | Payer: Medicare Other | Source: Home / Self Care

## 2022-06-04 ENCOUNTER — Other Ambulatory Visit: Payer: Self-pay

## 2022-06-04 ENCOUNTER — Emergency Department (HOSPITAL_BASED_OUTPATIENT_CLINIC_OR_DEPARTMENT_OTHER)
Admission: EM | Admit: 2022-06-04 | Discharge: 2022-06-04 | Disposition: A | Discharge status: Home / Self Care | Payer: Medicare Other | Attending: Emergency Medicine | Admitting: Emergency Medicine

## 2022-06-04 DIAGNOSIS — R45851 Suicidal ideations: Secondary | ICD-10-CM | POA: Diagnosis present

## 2022-06-04 DIAGNOSIS — F411 Generalized anxiety disorder: Secondary | ICD-10-CM | POA: Insufficient documentation

## 2022-06-04 DIAGNOSIS — N3000 Acute cystitis without hematuria: Secondary | ICD-10-CM | POA: Diagnosis not present

## 2022-06-04 DIAGNOSIS — Z7982 Long term (current) use of aspirin: Secondary | ICD-10-CM | POA: Diagnosis not present

## 2022-06-04 DIAGNOSIS — F329 Major depressive disorder, single episode, unspecified: Secondary | ICD-10-CM | POA: Insufficient documentation

## 2022-06-04 DIAGNOSIS — R45850 Homicidal ideations: Secondary | ICD-10-CM | POA: Diagnosis not present

## 2022-06-04 DIAGNOSIS — F32A Depression, unspecified: Secondary | ICD-10-CM

## 2022-06-04 DIAGNOSIS — N390 Urinary tract infection, site not specified: Secondary | ICD-10-CM

## 2022-06-04 LAB — RAPID URINE DRUG SCREEN, HOSP PERFORMED
Amphetamines: NOT DETECTED
Barbiturates: NOT DETECTED
Benzodiazepines: NOT DETECTED
Cocaine: NOT DETECTED
Opiates: NOT DETECTED
Tetrahydrocannabinol: NOT DETECTED

## 2022-06-04 LAB — URINALYSIS, ROUTINE W REFLEX MICROSCOPIC
Glucose, UA: NEGATIVE mg/dL
Hgb urine dipstick: NEGATIVE
Ketones, ur: 40 mg/dL — AB
Nitrite: NEGATIVE
Protein, ur: NEGATIVE mg/dL
Specific Gravity, Urine: >=1.03 (ref 1.005–1.030)
pH: 5 (ref 5.0–8.0)

## 2022-06-04 LAB — CBC
HCT: 43.9 % (ref 36.0–46.0)
Hemoglobin: 15.5 g/dL — ABNORMAL HIGH (ref 12.0–15.0)
MCH: 30.5 pg (ref 26.0–34.0)
MCHC: 35.3 g/dL (ref 30.0–36.0)
MCV: 86.2 fL (ref 80.0–100.0)
Platelets: 289 10*3/uL (ref 150–400)
RBC: 5.09 MIL/uL (ref 3.87–5.11)
RDW: 12.7 % (ref 11.5–15.5)
WBC: 7.6 10*3/uL (ref 4.0–10.5)
nRBC: 0 % (ref 0.0–0.2)

## 2022-06-04 LAB — ETHANOL: Alcohol, Ethyl (B): <10 mg/dL (ref <10)

## 2022-06-04 LAB — COMPREHENSIVE METABOLIC PANEL
ALT: 15 U/L (ref 0–44)
AST: 24 U/L (ref 15–41)
Albumin: 4.4 g/dL (ref 3.5–5.0)
Alkaline Phosphatase: 63 U/L (ref 38–126)
Anion gap: 14 (ref 5–15)
BUN: 18 mg/dL (ref 8–23)
CO2: 20 mmol/L — ABNORMAL LOW (ref 22–32)
Calcium: 9.5 mg/dL (ref 8.9–10.3)
Chloride: 103 mmol/L (ref 98–111)
Creatinine, Ser: 0.81 mg/dL (ref 0.44–1.00)
GFR, Estimated: >60 mL/min (ref >60)
Glucose, Bld: 111 mg/dL — ABNORMAL HIGH (ref 70–99)
Potassium: 3.5 mmol/L (ref 3.5–5.1)
Sodium: 137 mmol/L (ref 135–145)
Total Bilirubin: 1.3 mg/dL — ABNORMAL HIGH (ref 0.3–1.2)
Total Protein: 7.5 g/dL (ref 6.5–8.1)

## 2022-06-04 LAB — URINALYSIS, MICROSCOPIC (REFLEX)

## 2022-06-04 LAB — LIPASE, BLOOD: Lipase: 30 U/L (ref 11–51)

## 2022-06-04 LAB — ACETAMINOPHEN LEVEL: Acetaminophen (Tylenol), Serum: <10 ug/mL — ABNORMAL LOW (ref 10–30)

## 2022-06-04 LAB — SALICYLATE LEVEL: Salicylate Lvl: <7 mg/dL — ABNORMAL LOW (ref 7.0–30.0)

## 2022-06-04 MED ORDER — SULFAMETHOXAZOLE-TRIMETHOPRIM 800-160 MG PO TABS: 1.0000 | ORAL_TABLET | Freq: Two times a day (BID) | ORAL | 0 refills | Status: AC

## 2022-06-04 MED ORDER — LEVOTHYROXINE SODIUM 100 MCG PO TABS: 100.0000 ug | ORAL_TABLET | Freq: Every day | ORAL | Status: DC

## 2022-06-04 NOTE — ED Triage Notes (Signed)
States hasnt been eating the past few days. When asked if suicidal, patient states she has been feeling this way the past few days. Plan for SI is starvation per patient.  Adds she has abdominal pain and vomiting.  Husband states records are in Epic from [facility] from recent admission, this RN unable to access records at this time and husband does not want to give details of encounter.

## 2022-06-04 NOTE — ED Notes (Signed)
Reviewed discharge instructions and recommendations with patients husband. Resources provided . Pt ambulatory for discharge

## 2022-06-04 NOTE — ED Notes (Signed)
Patient wanded by security. 

## 2022-06-04 NOTE — ED Notes (Signed)
Pt with flat affect. Offers no conversation when questions asked. Turns to husband to answer questions. Husband explains chronic pain and she stops drinking and eating. Asked pt is she was in pain and says "legs". Voice low  Asked pt if she wanted to harm herself in any way.Pt says "no". Per husband not taking meds

## 2022-06-04 NOTE — ED Provider Notes (Signed)
[facility] EMERGENCY DEPARTMENT Provider Note   CSN: [id number] Arrival date & time: 06/04/22  1232     History  Chief Complaint  Patient presents with   Suicidal   Emesis    Alyssa [name] is a 71 y.o. female.  Patient presents with chief complaint of depression, brought in by her husband, concerned that she is malnutrition and dehydrated.  Husband states that she has not been eating or drinking in the last 2 to 3 days.  They were recently seen at [facility] for suicidal thoughts and discharge in April or May per her husband.  Otherwise the patient denies any fevers or cough.  No vomiting or diarrhea reported.  There was some confusion as to whether the patient had suicidal thoughts, however the husband clarifies and the patient clarifies that she does not have any active suicidal thoughts but just depression and decreased appetite.  Husband feels safe taking care of her at home.  They brought her into the hospital for evaluation of labs and outpatient resources.       Home Medications Prior to Admission medications   Medication Sig Start Date End Date Taking? Authorizing Provider  sulfamethoxazole-trimethoprim (BACTRIM DS) 800-160 MG tablet Take 1 tablet by mouth 2 (two) times daily for 7 days. 06/04/22 06/11/22 Yes Cheryll Cockayne, MD  acetaminophen (TYLENOL) 325 MG tablet Take 325-650 mg by mouth every 6 (six) hours as needed for headache (pain).    [provider]  Ascorbic Acid (VITAMIN C) 1000 MG tablet Take 1,000 mg by mouth daily.    [provider]  aspirin EC 81 MG EC tablet Take 1 tablet (81 mg total) by mouth daily. Swallow whole. 11/29/21   Rai, Delene Ruffini, MD  atorvastatin (LIPITOR) 10 MG tablet Take 1 tablet (10 mg total) by mouth daily. 11/29/21   Rai, Delene Ruffini, MD  Cholecalciferol (VITAMIN D-3) 125 MCG (5000 UT) TABS Take 5,000 Units by mouth daily.    [provider]  Cyanocobalamin (VITAMIN B-12 PO) Take 1 tablet by mouth  daily. Patient not taking: Reported on 11/27/2021    [provider]  DULoxetine (CYMBALTA) 30 MG capsule Take 1 capsule (30 mg total) by mouth daily. 11/28/21   Rai, Ripudeep K, MD  EPINEPHrine 0.3 mg/0.3 mL IJ SOAJ injection Inject 0.3 mg into the muscle once as needed for anaphylaxis (severe allergic reaction).    [provider]  escitalopram (LEXAPRO) 10 MG tablet Take 10 mg by mouth every morning. 11/11/21   [provider]  Estradiol 10 MCG TABS vaginal tablet Place 1 tablet (10 mcg total) vaginally 3 (three) times a week. Patient taking differently: Place 10 mcg vaginally See admin instructions. Insert one tablet (10 mcg) vaginally up to three times weekly as needed for vaginal dryness 08/02/21   Reva Bores, MD  fluticasone [facility]) 50 MCG/ACT nasal spray Place 1 spray into both nostrils at bedtime as needed for allergies or rhinitis.    [provider]  gabapentin (NEURONTIN) 300 MG capsule Take 1 capsule (300 mg total) by mouth 3 (three) times daily. 11/28/21   Rai, Delene Ruffini, MD  hydrocortisone cream 1 % Apply 1 application topically daily as needed for itching.    [provider]  hydrOXYzine (ATARAX) 25 MG tablet Take 1 tablet (25 mg total) by mouth 3 (three) times daily as needed for itching or anxiety. 11/28/21   Rai, Ripudeep K, MD  L-Methylfolate-Algae-B12-B6 (METANX PO) Take 1-2 capsules by mouth  daily.    [provider]  levothyroxine (SYNTHROID) 112 MCG tablet Take 1 tablet (112 mcg total) by mouth as directed. Two on sundays and 1 tablet the rest of the week Patient taking differently: Take 112 mcg by mouth See admin instructions. Take 1 1/2 tablets (168 mcg) by mouth on Sunday mornings, take 1 tablet (112 mcg) on all other mornings of the week 09/13/21   Shamleffer, Konrad Dolores, MD  naproxen sodium (ALEVE) 220 MG tablet Take 220 mg by mouth 2 (two) times daily as needed (pain/headache).    [provider]   oxyCODONE-acetaminophen (PERCOCET) 7.5-325 MG tablet Take 1 tablet by mouth every 4 (four) hours as needed for severe pain. 11/28/21 11/28/22  Rai, Delene Ruffini, MD  pyridOXINE (VITAMIN B-6) 100 MG tablet Take 100 mg by mouth daily.    [provider]  timolol (TIMOPTIC) 0.5 % ophthalmic solution timolol maleate 0.5 % eye drops  once daily Patient not taking: Reported on 11/27/2021 08/15/17   [provider]  Travoprost, BAK Free, (TRAVATAN) 0.004 % SOLN ophthalmic solution 1 drop at bedtime.    [provider]  valACYclovir (VALTREX) 500 MG tablet Take 500 mg by mouth daily. Patient not taking: Reported on 11/27/2021 11/21/21   [provider]  vitamin E 180 MG (400 UNITS) capsule Take 400 Units by mouth daily.    [provider]  zolpidem (AMBIEN) 5 MG tablet Take 5 mg by mouth at bedtime as needed for sleep.    [provider]      Allergies    Amoxicillin    Review of Systems   Review of Systems  Constitutional:  Negative for fever.  HENT:  Negative for ear pain.   Eyes:  Negative for pain.  Respiratory:  Negative for cough.   Cardiovascular:  Negative for chest pain.  Gastrointestinal:  Negative for abdominal pain.  Genitourinary:  Negative for flank pain.  Musculoskeletal:  Negative for back pain.  Skin:  Negative for rash.  Neurological:  Negative for headaches.  Psychiatric/Behavioral:  Positive for suicidal ideas.     Physical Exam Updated Vital Signs BP (!) 155/75   Pulse 78   Temp 98.3 F (36.8 C) (Oral)   Resp 14   Ht 5\' 8"  (1.727 m)   Wt 67.5 kg   SpO2 99%   BMI 22.63 kg/m  Physical Exam Constitutional:      General: She is not in acute distress.    Appearance: Normal appearance.  HENT:     Head: Normocephalic.     Nose: Nose normal.  Eyes:     Extraocular Movements: Extraocular movements intact.  Cardiovascular:     Rate and Rhythm: Normal rate.  Pulmonary:     Effort: Pulmonary effort is normal.   Musculoskeletal:        General: Normal range of motion.     Cervical back: Normal range of motion.  Neurological:     General: No focal deficit present.     Mental Status: She is alert. Mental status is at baseline.  Psychiatric:     Comments: Patient appears moderately anxious and jittery.  Depressed mood is present.  Otherwise cooperative     ED Results / Procedures / Treatments   Labs (all labs ordered are listed, but only abnormal results are displayed) Labs Reviewed  COMPREHENSIVE METABOLIC PANEL - Abnormal; Notable for the following components:      Result Value   CO2 20 (*)    Glucose,  Bld 111 (*)    Total Bilirubin 1.3 (*)    All other components within normal limits  SALICYLATE LEVEL - Abnormal; Notable for the following components:   Salicylate Lvl <7.0 (*)    All other components within normal limits  ACETAMINOPHEN LEVEL - Abnormal; Notable for the following components:   Acetaminophen (Tylenol), Serum <10 (*)    All other components within normal limits  CBC - Abnormal; Notable for the following components:   Hemoglobin 15.5 (*)    All other components within normal limits  URINALYSIS, ROUTINE W REFLEX MICROSCOPIC - Abnormal; Notable for the following components:   Color, Urine AMBER (*)    APPearance HAZY (*)    Bilirubin Urine SMALL (*)    Ketones, ur 40 (*)    Leukocytes,Ua TRACE (*)    All other components within normal limits  URINALYSIS, MICROSCOPIC (REFLEX) - Abnormal; Notable for the following components:   Bacteria, UA MANY (*)    All other components within normal limits  ETHANOL  RAPID URINE DRUG SCREEN, HOSP PERFORMED  LIPASE, BLOOD    EKG None  Radiology No results found.  Procedures Procedures    Medications Ordered in ED Medications  levothyroxine (SYNTHROID) tablet 100 mcg (has no administration in time range)    ED Course/ Medical Decision Making/ A&P                           Medical Decision Making Amount and/or  Complexity of Data Reviewed Labs: ordered.  Risk Prescription drug management.   History obtained from family at bedside.  Cardiac monitoring showing sinus rhythm.  Sinus studies were unremarkable.  Urinalysis concerning for UTI.  Will be prescribed Keflex to go home with.  [facility].  Advised immediate return for worsening symptoms or suicidal thoughts at home.  Husband expressed understanding patient appears comfortable with plan.  Discharged home in stable condition.        Final Clinical Impression(s) / ED Diagnoses Final diagnoses:  Depression, unspecified depression type  Urinary tract infection without hematuria, site unspecified    Rx / DC Orders ED Discharge Orders          Ordered    sulfamethoxazole-trimethoprim (BACTRIM DS) 800-160 MG tablet  2 times daily        06/04/22 1542              Cheryll Cockayne, MD 06/04/22 1542

## 2022-06-07 ENCOUNTER — Ambulatory Visit (HOSPITAL_COMMUNITY)
Admission: EM | Admit: 2022-06-07 | Discharge: 2022-06-08 | Disposition: A | Discharge status: Other Acute Inpatient Hospital | Payer: Medicare Other | Attending: Psychiatry | Admitting: Psychiatry

## 2022-06-07 ENCOUNTER — Encounter (HOSPITAL_COMMUNITY): Payer: Self-pay | Admitting: Student

## 2022-06-07 ENCOUNTER — Other Ambulatory Visit: Payer: Self-pay

## 2022-06-07 DIAGNOSIS — F419 Anxiety disorder, unspecified: Secondary | ICD-10-CM | POA: Insufficient documentation

## 2022-06-07 DIAGNOSIS — F32A Depression, unspecified: Secondary | ICD-10-CM | POA: Diagnosis not present

## 2022-06-07 DIAGNOSIS — K5900 Constipation, unspecified: Secondary | ICD-10-CM | POA: Insufficient documentation

## 2022-06-07 DIAGNOSIS — E890 Postprocedural hypothyroidism: Secondary | ICD-10-CM | POA: Insufficient documentation

## 2022-06-07 DIAGNOSIS — R4182 Altered mental status, unspecified: Secondary | ICD-10-CM | POA: Diagnosis not present

## 2022-06-07 DIAGNOSIS — F410 Panic disorder [episodic paroxysmal anxiety] without agoraphobia: Secondary | ICD-10-CM | POA: Insufficient documentation

## 2022-06-07 DIAGNOSIS — Z20822 Contact with and (suspected) exposure to covid-19: Secondary | ICD-10-CM | POA: Insufficient documentation

## 2022-06-07 DIAGNOSIS — G8929 Other chronic pain: Secondary | ICD-10-CM | POA: Insufficient documentation

## 2022-06-07 DIAGNOSIS — Z85850 Personal history of malignant neoplasm of thyroid: Secondary | ICD-10-CM | POA: Insufficient documentation

## 2022-06-07 DIAGNOSIS — F411 Generalized anxiety disorder: Secondary | ICD-10-CM | POA: Insufficient documentation

## 2022-06-07 DIAGNOSIS — F22 Delusional disorders: Secondary | ICD-10-CM | POA: Diagnosis present

## 2022-06-07 DIAGNOSIS — R3915 Urgency of urination: Secondary | ICD-10-CM | POA: Insufficient documentation

## 2022-06-07 DIAGNOSIS — R112 Nausea with vomiting, unspecified: Secondary | ICD-10-CM | POA: Insufficient documentation

## 2022-06-07 DIAGNOSIS — Z91148 Patient's other noncompliance with medication regimen for other reason: Secondary | ICD-10-CM | POA: Diagnosis not present

## 2022-06-07 DIAGNOSIS — M545 Low back pain, unspecified: Secondary | ICD-10-CM | POA: Insufficient documentation

## 2022-06-07 DIAGNOSIS — Z79899 Other long term (current) drug therapy: Secondary | ICD-10-CM | POA: Insufficient documentation

## 2022-06-07 DIAGNOSIS — Z79890 Hormone replacement therapy (postmenopausal): Secondary | ICD-10-CM | POA: Insufficient documentation

## 2022-06-07 DIAGNOSIS — R630 Anorexia: Secondary | ICD-10-CM | POA: Diagnosis not present

## 2022-06-07 LAB — LIPID PANEL
Cholesterol: 309 mg/dL — ABNORMAL HIGH (ref 0–200)
HDL: 75 mg/dL (ref >40)
LDL Cholesterol: 222 mg/dL — ABNORMAL HIGH (ref 0–99)
Total CHOL/HDL Ratio: 4.1 RATIO
Triglycerides: 62 mg/dL (ref <150)
VLDL: 12 mg/dL (ref 0–40)

## 2022-06-07 LAB — COMPREHENSIVE METABOLIC PANEL
ALT: 17 U/L (ref 0–44)
AST: 23 U/L (ref 15–41)
Albumin: 4.7 g/dL (ref 3.5–5.0)
Alkaline Phosphatase: 62 U/L (ref 38–126)
Anion gap: 16 — ABNORMAL HIGH (ref 5–15)
BUN: 27 mg/dL — ABNORMAL HIGH (ref 8–23)
CO2: 21 mmol/L — ABNORMAL LOW (ref 22–32)
Calcium: 10 mg/dL (ref 8.9–10.3)
Chloride: 101 mmol/L (ref 98–111)
Creatinine, Ser: 1.03 mg/dL — ABNORMAL HIGH (ref 0.44–1.00)
GFR, Estimated: 59 mL/min — ABNORMAL LOW (ref >60)
Glucose, Bld: 87 mg/dL (ref 70–99)
Potassium: 3.7 mmol/L (ref 3.5–5.1)
Sodium: 138 mmol/L (ref 135–145)
Total Bilirubin: 1.5 mg/dL — ABNORMAL HIGH (ref 0.3–1.2)
Total Protein: 7.5 g/dL (ref 6.5–8.1)

## 2022-06-07 LAB — POCT URINE DRUG SCREEN - MANUAL ENTRY (I-SCREEN)
POC Amphetamine UR: NOT DETECTED
POC Buprenorphine (BUP): NOT DETECTED
POC Cocaine UR: NOT DETECTED
POC Marijuana UR: NOT DETECTED
POC Methadone UR: NOT DETECTED
POC Methamphetamine UR: NOT DETECTED
POC Morphine: NOT DETECTED
POC Oxazepam (BZO): POSITIVE — AB
POC Oxycodone UR: NOT DETECTED
POC Secobarbital (BAR): NOT DETECTED

## 2022-06-07 LAB — CBC WITH DIFFERENTIAL/PLATELET
Abs Immature Granulocytes: 0.03 10*3/uL (ref 0.00–0.07)
Basophils Absolute: 0.1 10*3/uL (ref 0.0–0.1)
Basophils Relative: 2 %
Eosinophils Absolute: 0 10*3/uL (ref 0.0–0.5)
Eosinophils Relative: 0 %
HCT: 44.7 % (ref 36.0–46.0)
Hemoglobin: 15.6 g/dL — ABNORMAL HIGH (ref 12.0–15.0)
Immature Granulocytes: 0 %
Lymphocytes Relative: 14 %
Lymphs Abs: 1.1 10*3/uL (ref 0.7–4.0)
MCH: 30.8 pg (ref 26.0–34.0)
MCHC: 34.9 g/dL (ref 30.0–36.0)
MCV: 88.3 fL (ref 80.0–100.0)
Monocytes Absolute: 0.4 10*3/uL (ref 0.1–1.0)
Monocytes Relative: 5 %
Neutro Abs: 6.2 10*3/uL (ref 1.7–7.7)
Neutrophils Relative %: 79 %
Platelets: 329 10*3/uL (ref 150–400)
RBC: 5.06 MIL/uL (ref 3.87–5.11)
RDW: 12.7 % (ref 11.5–15.5)
WBC: 7.8 10*3/uL (ref 4.0–10.5)
nRBC: 0 % (ref 0.0–0.2)

## 2022-06-07 LAB — RESP PANEL BY RT-PCR (FLU A&B, COVID) ARPGX2
Influenza A by PCR: NEGATIVE
Influenza B by PCR: NEGATIVE
SARS Coronavirus 2 by RT PCR: NEGATIVE

## 2022-06-07 LAB — TSH: TSH: 26.37 u[IU]/mL — ABNORMAL HIGH (ref 0.350–4.500)

## 2022-06-07 LAB — HEMOGLOBIN A1C
Hgb A1c MFr Bld: 5.1 % (ref 4.8–5.6)
Mean Plasma Glucose: 100 mg/dL

## 2022-06-07 MED ORDER — SULFAMETHOXAZOLE-TRIMETHOPRIM 800-160 MG PO TABS
1.0000 | ORAL_TABLET | Freq: Two times a day (BID) | ORAL | Status: DC
  Administered 2022-06-07 – 2022-06-08 (×2): 1 via ORAL
  Filled 2022-06-07 (×2): qty 1

## 2022-06-07 MED ORDER — OLANZAPINE 5 MG PO TBDP
2.5000 mg | ORAL_TABLET | Freq: Two times a day (BID) | ORAL | Status: DC
  Administered 2022-06-07 – 2022-06-08 (×2): 2.5 mg via ORAL

## 2022-06-07 MED ORDER — OLANZAPINE 5 MG PO TBDP
5.0000 mg | ORAL_TABLET | Freq: Three times a day (TID) | ORAL | Status: DC | PRN
  Filled 2022-06-07: qty 1

## 2022-06-07 MED ORDER — ACETAMINOPHEN 325 MG PO TABS: 650.0000 mg | ORAL_TABLET | Freq: Four times a day (QID) | ORAL | Status: DC | PRN

## 2022-06-07 MED ORDER — HALOPERIDOL 0.5 MG PO TABS: 1.0000 mg | ORAL_TABLET | Freq: Four times a day (QID) | ORAL | Status: DC | PRN

## 2022-06-07 MED ORDER — ALUM & MAG HYDROXIDE-SIMETH 200-200-20 MG/5ML PO SUSP: 30.0000 mL | ORAL | Status: DC | PRN

## 2022-06-07 MED ORDER — MAGNESIUM HYDROXIDE 400 MG/5ML PO SUSP: 30.0000 mL | Freq: Every day | ORAL | Status: DC | PRN

## 2022-06-07 MED ORDER — OLANZAPINE 5 MG PO TBDP
ORAL_TABLET | ORAL | Status: AC
  Administered 2022-06-07: 2.5 mg via ORAL
  Filled 2022-06-07: qty 1

## 2022-06-07 MED ORDER — OLANZAPINE 5 MG PO TBDP: 2.5000 mg | ORAL_TABLET | Freq: Two times a day (BID) | ORAL | Status: DC

## 2022-06-07 MED ORDER — ZIPRASIDONE MESYLATE 20 MG IM SOLR
20.0000 mg | INTRAMUSCULAR | Status: DC | PRN
Start: 2022-06-07 — End: 2022-06-08

## 2022-06-07 MED ORDER — HALOPERIDOL LACTATE 5 MG/ML IJ SOLN: 1.0000 mg | Freq: Four times a day (QID) | INTRAMUSCULAR | Status: DC | PRN

## 2022-06-07 MED ORDER — HYDROXYZINE HCL 25 MG PO TABS: 25.0000 mg | ORAL_TABLET | Freq: Three times a day (TID) | ORAL | Status: DC | PRN

## 2022-06-07 MED ORDER — LEVOTHYROXINE SODIUM 25 MCG PO TABS
125.0000 ug | ORAL_TABLET | Freq: Every day | ORAL | Status: DC
  Administered 2022-06-08: 125 ug via ORAL
  Filled 2022-06-07: qty 1

## 2022-06-07 NOTE — ED Provider Notes (Signed)
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  History was obtained with husband in room. The history had to be obtained primarily through her husband due to limited engagement and cooperativity of patient throughout exam. Later husband and wife were separately interviewed to allow both to speak freely.  This episode of altered mental status started occurring last week when the patient started acting strangely at home. Her husband notes that she started believing that she was getting special messages from [place]. She began weirdly arranging things in her room, reports nausea, and has significantly reduced her oral intake to 1 to 2 tablespoons of food per day. Due to her limited oral intake, the patient stopped taking her Depakote & Clonazepam, and took only her Levothyroxine and Keflex.  Husband reports that she has had many episodes of SA, psychosis, and mania since she was 57, with numerous hospitalizations. This has become more frequent since 2019. Most recently, she was hospitalized at [place] and started on Risperdal 2 mg qhs and Lorazepam BID due to c/f Bipolar 1 Disorder and PTSD. They felt her presentation was more consistent with panic disorder and severe anxiety, so they switched her to Seroquel and Klonopin. This led to improvement in her anxiety and sleep. On 5/23 she discontinued the Seroquel due to nightmares and was started on Depakote 500 mg daily  with the plan to follow-up soon with an OP psychiatrist (Dr. Vianne Bulls). Most recently, she was seen in the [facility] for worsened mood and resources and was diagnosed with a UTI for which she was prescribed Keflex. She was also seen [place] [name] where she was deemed psychiatrically stable and sent home, though some aspects of paranoid delusions were noted.  Patient denies in room that she has had decreased appetite and has had change in oral intake. When asked about why she is not eating any feelings she responds that she is not sure that this is the right thing to do. When asked what she's concerned about she says she is unsure. Denies that she has thoughts or voices that are telling her not to eat. She has also had trouble going to the bathroom, sometimes urinating on the floor. Husband is also concerned that the patient tried to run away with their dog and her insistence that he could not feed the dog.   Psych ROS and its fidelity are limited by patient engagement in exam. She endorses being anxious with racing, repetitive thoughts. She is sleeping for four hours a night. Patient denies auditory and visual hallucinations and delusions. Denies suicidal and homicidal ideation. The remainder of the psycho ROS was unable to be obtained.   PHQ 2-9:  [employer] from 06/21/2021 in [facility] at [place] Visit from 06/05/2021 in [place] at [place] that you would be better off dead, or of hurting yourself in some way Not at all Not at all  PHQ-9  Total Score 0 0       Flowsheet Row ED from 06/07/2022 in [facility] ED from 06/04/2022 in [place] ED to Hosp-Admission (Discharged) from 11/26/2021 in [place] HF PCU  C-SSRS RISK CATEGORY No Risk High Risk No Risk        Total Time spent with patient: 1 hour  Musculoskeletal  Strength & Muscle  Tone: within normal limits Gait & Station: normal Patient leans: N/A  Psychiatric Specialty Exam  Presentation General Appearance: Fairly Groomed (Guarded, clingling onto husband)  Eye Contact:Poor  Speech:Clear and Coherent; Normal Rate  Speech Volume:Normal  Handedness:Right   Mood and Affect  Mood:Anxious  Affect:Flat; Congruent (Anxious)   Thought Process  Thought Processes:Coherent; Goal Directed ([place] information. Would not answer questions directly)  Descriptions of Associations:Circumstantial  Orientation:Full (Time, Place and Person)  Thought Content:Illogical; Delusions; Perseveration; Paranoid Ideation (Paranoid delusions about eating, hyper-religiosity)    Hallucinations:Hallucinations: Other (comment) (Husband reported that patient experiences AH)  Ideas of Reference:Delusions  Suicidal Thoughts:Suicidal Thoughts: No  Homicidal Thoughts:Homicidal Thoughts: No   Sensorium  Memory:Immediate Good; Remote Poor  Judgment:Impaired  Insight:Lacking   Executive Functions  Concentration:Fair  Attention Span:Fair  [place]  Language:Good   Psychomotor Activity  Psychomotor Activity:Psychomotor Activity: Increased (fidgeting)   Assets  Assets:Intimacy; Housing; Physical Health; Social Support; Vocational/Educational; Catering manager   Sleep  Sleep:Sleep: Poor (3hrs a night)   Nutritional Assessment (For OBS and FBC admissions only) Has the patient had a weight loss or gain of 10 pounds or more in the last 3 months?: Yes Has the patient had a decrease in food intake/or appetite?: Yes Does the patient have dental problems?: Yes Does the patient have eating habits or behaviors that may be indicators of an eating disorder including binging or inducing vomiting?: Yes Has the patient recently lost weight without trying?: -- (Yes unclear) Has the patient been eating poorly because of a decreased  appetite?: 1    Physical Exam Vitals and nursing note reviewed.  Constitutional:      General: She is not in acute distress.    Appearance: Normal appearance. She is well-developed and normal weight.     Comments: Patient trembling in arms throughout interview, clinging on to husband. Frequently looking to him for reassurance and holding his hand.  HENT:     Head: Normocephalic and atraumatic.  Eyes:     Conjunctiva/sclera: Conjunctivae normal.  Cardiovascular:     Heart sounds: No murmur heard. Abdominal:     Palpations: Abdomen is soft.  Musculoskeletal:        General: Normal range of motion.     Cervical back: Neck supple.     Comments: tremulous  Skin:    General: Skin is warm and dry.  Neurological:     Mental Status: She is alert.    Review of Systems  Respiratory:  Negative for shortness of breath.   Gastrointestinal:  Positive for constipation, nausea and vomiting.  Genitourinary:  Positive for dysuria and urgency.    Blood pressure (!) 143/88, pulse 98, temperature 98.6 F (37 C), temperature source Oral, resp. rate 18, SpO2 96 %. There is no height or weight on file to calculate BMI.  Past Psychiatric History:  Husband reports multiple psychiatric hospitalizations for suicide attempts, psychosis, and mania dating back to when she turned 5.  Is the patient at risk to self? Yes  Has the patient been a risk to self  in the past 6 months? Yes .    Has the patient been a risk to self within the distant past? Yes   Is the patient a risk to others? Yes   Has the patient been a risk to others in the past 6 months? Yes   Has the patient been a risk to others within the distant past?  unclear  Past Medical History:  Past Medical History:  Diagnosis Date   Anxiety and depression    Arthritis    Cervicogenic headache    Claustrophobia    Glaucoma    Hypertension    Lumbar radiculopathy    Postsurgical hypothyroidism    Spinal stenosis    Thyroid cancer ([place])     s/p thyroidectomy    Past Surgical History:  Procedure Laterality Date   FOOT SURGERY     LAMINOTOMY  04/03/2021   LEFT L3/4 LAMINOTOMY BILATERAL L3/4 MEDIAL FACETECTOMY   LUMBAR LAMINECTOMY  03/03/2018   THYROIDECTOMY  12/2004    Family History:  Family History  Problem Relation Age of Onset   Hypertension Sister    Thyroid cancer Brother    Skin cancer Brother    Diabetes Paternal Grandmother    Colon cancer Neg Hx    Breast cancer Neg Hx    Pancreatic cancer Neg Hx    Stomach cancer Neg Hx    Liver cancer Neg Hx    Esophageal cancer Neg Hx     Social History:  Social History   Socioeconomic History   Marital status: Married    Spouse name: Not on file   Number of children: 0   Years of education: Not on file   Highest education level: Master's degree (e.g., MA, MS, MEng, MEd, MSW, MBA)  Occupational History   Occupation: retired, Pharmacologist  Tobacco Use   Smoking status: Never   Smokeless tobacco: Never  Vaping Use   Vaping Use: Never used  Substance and Sexual Activity   Alcohol use: Yes    Comment: Rare   Drug use: Never   Sexual activity: Not Currently  Other Topics Concern   Not on file  Social History Narrative   Moved from [place] June 2022   Lives w/ husband   Moved to stay close to her sister (Mrs Terance [place])   Social Determinants of Health   Financial Resource Strain: Not on file  Food Insecurity: Not on file  Transportation Needs: Not on file  Physical Activity: Not on file  Stress: Not on file  Social Connections: Not on file  Intimate Partner Violence: Not on file    SDOH:  SDOH Screenings   Alcohol Screen: Not on file  Depression (PHQ2-9): Low Risk  (06/21/2021)   Depression (PHQ2-9)    PHQ-2 Score: 0  Financial Resource Strain: Not on file  Food Insecurity: Not on file  Housing: Not on file  Physical Activity: Not on file  Social Connections: Not on file  Stress: Not on file  Tobacco Use: Low Risk  (06/07/2022)   Patient  History    Smoking Tobacco Use: Never    Smokeless Tobacco Use: Never    Passive Exposure: Not on file  Transportation Needs: Not on file    Last Labs:  Admission on 06/07/2022  Component Date Value Ref Range Status   WBC 06/07/2022 7.8  4.0 - 10.5 K/uL Final   RBC 06/07/2022 5.06  3.87 - 5.11 MIL/uL Final   Hemoglobin 06/07/2022 15.6 (H)  12.0 - 15.0 g/dL  Final   HCT 06/07/2022 44.7  36.0 - 46.0 % Final   MCV 06/07/2022 88.3  80.0 - 100.0 fL Final   MCH 06/07/2022 30.8  26.0 - 34.0 pg Final   MCHC 06/07/2022 34.9  30.0 - 36.0 g/dL Final   RDW 06/07/2022 12.7  11.5 - 15.5 % Final   Platelets 06/07/2022 329  150 - 400 K/uL Final   nRBC 06/07/2022 0.0  0.0 - 0.2 % Final   Neutrophils Relative % 06/07/2022 79  % Final   Neutro Abs 06/07/2022 6.2  1.7 - 7.7 K/uL Final   Lymphocytes Relative 06/07/2022 14  % Final   Lymphs Abs 06/07/2022 1.1  0.7 - 4.0 K/uL Final   Monocytes Relative 06/07/2022 5  % Final   Monocytes Absolute 06/07/2022 0.4  0.1 - 1.0 K/uL Final   Eosinophils Relative 06/07/2022 0  % Final   Eosinophils Absolute 06/07/2022 0.0  0.0 - 0.5 K/uL Final   Basophils Relative 06/07/2022 2  % Final   Basophils Absolute 06/07/2022 0.1  0.0 - 0.1 K/uL Final   Immature Granulocytes 06/07/2022 0  % Final   Abs Immature Granulocytes 06/07/2022 0.03  0.00 - 0.07 K/uL Final   Performed at [facility], [place] [name] [place], [place] [address]   POC Amphetamine UR 06/07/2022 None Detected  NONE DETECTED (Cut Off Level 1000 ng/mL) Final   POC Secobarbital (BAR) 06/07/2022 None Detected  NONE DETECTED (Cut Off Level 300 ng/mL) Final   POC Buprenorphine (BUP) 06/07/2022 None Detected  NONE DETECTED (Cut Off Level 10 ng/mL) Final   POC Oxazepam (BZO) 06/07/2022 Positive (A)  NONE DETECTED (Cut Off Level 300 ng/mL) Final   POC Cocaine UR 06/07/2022 None Detected  NONE DETECTED (Cut Off Level 300 ng/mL) Final   POC Methamphetamine UR 06/07/2022 None Detected  NONE DETECTED (Cut  Off Level 1000 ng/mL) Final   POC Morphine 06/07/2022 None Detected  NONE DETECTED (Cut Off Level 300 ng/mL) Final   POC Methadone UR 06/07/2022 None Detected  NONE DETECTED (Cut Off Level 300 ng/mL) Final   POC Oxycodone UR 06/07/2022 None Detected  NONE DETECTED (Cut Off Level 100 ng/mL) Final   POC Marijuana UR 06/07/2022 None Detected  NONE DETECTED (Cut Off Level 50 ng/mL) Final  Admission on 06/04/2022, Discharged on 06/04/2022  Component Date Value Ref Range Status   Sodium 06/04/2022 137  135 - 145 mmol/L Final   Potassium 06/04/2022 3.5  3.5 - 5.1 mmol/L Final   Chloride 06/04/2022 103  98 - 111 mmol/L Final   CO2 06/04/2022 20 (L)  22 - 32 mmol/L Final   Glucose, Bld 06/04/2022 111 (H)  70 - 99 mg/dL Final   Glucose reference range applies only to samples taken after fasting for at least 8 hours.   BUN 06/04/2022 18  8 - 23 mg/dL Final   Creatinine, Ser 06/04/2022 0.81  0.44 - 1.00 mg/dL Final   Calcium 06/04/2022 9.5  8.9 - 10.3 mg/dL Final   Total Protein 06/04/2022 7.5  6.5 - 8.1 g/dL Final   Albumin 06/04/2022 4.4  3.5 - 5.0 g/dL Final   AST 06/04/2022 24  15 - 41 U/L Final   ALT 06/04/2022 15  0 - 44 U/L Final   Alkaline Phosphatase 06/04/2022 63  38 - 126 U/L Final   Total Bilirubin 06/04/2022 1.3 (H)  0.3 - 1.2 mg/dL Final   GFR, Estimated 06/04/2022 >60  >60 mL/min Final   Comment: (NOTE) Calculated using  the CKD-EPI Creatinine Equation (2021)    Anion gap 06/04/2022 14  5 - 15 Final   Performed at [facility], [address] [place], [place] [address]   Alcohol, Ethyl (B) 06/04/2022 <10  <10 mg/dL Final   Comment:        LOWEST DETECTABLE LIMIT FOR SERUM ALCOHOL IS 10 mg/dL FOR MEDICAL PURPOSES ONLY Performed at [facility], [address] [place], [place] [address]    Salicylate Lvl 43/32/9518 <7.0 (L)  7.0 - 30.0 mg/dL Final   Performed at [facility], [address] [place], [place] [address]   Acetaminophen (Tylenol),  Serum 06/04/2022 <10 (L)  10 - 30 ug/mL Final   Performed at [facility], [address] [place], [place] [address]   WBC 06/04/2022 7.6  4.0 - 10.5 K/uL Final   RBC 06/04/2022 5.09  3.87 - 5.11 MIL/uL Final   Hemoglobin 06/04/2022 15.5 (H)  12.0 - 15.0 g/dL Final   HCT 06/04/2022 43.9  36.0 - 46.0 % Final   MCV 06/04/2022 86.2  80.0 - 100.0 fL Final   MCH 06/04/2022 30.5  26.0 - 34.0 pg Final   MCHC 06/04/2022 35.3  30.0 - 36.0 g/dL Final   RDW 06/04/2022 12.7  11.5 - 15.5 % Final   Platelets 06/04/2022 289  150 - 400 K/uL Final   nRBC 06/04/2022 0.0  0.0 - 0.2 % Final   Performed at [facility], [address] [place], [place] [address]   Opiates 06/04/2022 NONE DETECTED  NONE DETECTED Final   Cocaine 06/04/2022 NONE DETECTED  NONE DETECTED Final   Benzodiazepines 06/04/2022 NONE DETECTED  NONE DETECTED Final   Amphetamines 06/04/2022 NONE DETECTED  NONE DETECTED Final   Tetrahydrocannabinol 06/04/2022 NONE DETECTED  NONE DETECTED Final   Barbiturates 06/04/2022 NONE DETECTED  NONE DETECTED Final   Comment: (NOTE) DRUG SCREEN FOR MEDICAL PURPOSES ONLY.  IF CONFIRMATION IS NEEDED FOR ANY PURPOSE, NOTIFY LAB WITHIN 5 DAYS.  LOWEST DETECTABLE LIMITS FOR URINE DRUG SCREEN Drug Class                     Cutoff (ng/mL) Amphetamine and metabolites    1000 Barbiturate and metabolites    200 Benzodiazepine                 323 Tricyclics and metabolites     300 Opiates and metabolites        300 Cocaine and metabolites        300 THC                            50 Performed at [facility], [address] [place], [place] [address]    Lipase 06/04/2022 30  11 - 51 U/L Final   Performed at [facility], [address] [place], [place] [address]   Color, Urine 06/04/2022 AMBER (A)  YELLOW Final   BIOCHEMICALS MAY BE AFFECTED BY COLOR   APPearance 06/04/2022 HAZY (A)  CLEAR Final   Specific Gravity, Urine 06/04/2022 >=1.030  1.005 -  1.030 Final   pH 06/04/2022 5.0  5.0 - 8.0 Final   Glucose, UA 06/04/2022 NEGATIVE  NEGATIVE mg/dL Final   Hgb urine dipstick 06/04/2022 NEGATIVE  NEGATIVE Final   Bilirubin Urine 06/04/2022 SMALL (A)  NEGATIVE Final   Ketones,  ur 06/04/2022 40 (A)  NEGATIVE mg/dL Final   Protein, ur 06/04/2022 NEGATIVE  NEGATIVE mg/dL Final   Nitrite 06/04/2022 NEGATIVE  NEGATIVE Final   Leukocytes,Ua 06/04/2022 TRACE (A)  NEGATIVE Final   Performed at [facility], [address] [place], [place] [address]   RBC / HPF 06/04/2022 0-5  0 - 5 RBC/hpf Final   WBC, UA 06/04/2022 21-50  0 - 5 WBC/hpf Final   Bacteria, UA 06/04/2022 MANY (A)  NONE SEEN Final   Squamous Epithelial / LPF 06/04/2022 0-5  0 - 5 Final   Mucus 06/04/2022 PRESENT   Final   Performed at [facility], [address] [place], [place] [address]    Allergies: Amoxicillin  PTA Medications: (Not in a hospital admission)   Medical Decision Making   Alyssa [name] is a 71 year-old female with PMH of thyroid cancer (s/p 2016 resection), hypothyroidism, chronic lower back pain, and vulvodynia with PPH c/f Bipolar 1 D, GAD, and Panic Disorder presenting with AMS.  The patient is presenting with delusions, bizarre behavior, decreased need for sleep, racing, repetitive thoughts, and anorexia in the setting of recent Seroquel and Depakote discontinuation and UTI dx treated with Keflex (tx day 4 of 7). Given her hyperreligious and paranoid delusions and bizarre behavior she appears psychotic. Her decreased need for sleep and racing, repetitve thoughts are supportive of a potential manic component. Patient's anorexia could be due to an underlying medical condition (supported by report of nausea), depression, psychosis, or catatonia. Given patient is psychotic with inability to reliably take meds in the OP setting and severe anorexia, patient does not appear to be psychiatrically stable and requires further careful evaluation.  Patient is not agreeable to hospitalization, so IVC paperwork was submitted.  - Zyprexa 2.5 mg ODT BID - Bactrim Twice Daily - Levothyroxine - Take 1 tablet (112 mcg total) by mouth as directed. Two on sundays and 1 tablet the rest of the week - IVC'd on 6/29 - Requesting transfer to geriatric psychiatry  Recommendations  Based on my evaluation the patient appears to have an emergency medical condition for which I recommend the patient be transferred to the emergency department for further evaluation.   Cena Benton, Medical Student 06/07/22 1615 _______________________________________________ I personally was present and performed or re-performed the history, physical exam and medical decision-making activities of this service and have verified that the service and findings are accurately documented in the student's note.  40F with suspected past history of Bipolar 1 d/o presented to [facility] with husband from home for concern of 6days of self neglect and at least 2 weeks of paranoid delusions.  On exam, patient was alert and oriented x4.  She was able to state the days of the week backwards. Her thought process was coherent.  However she was circumstantial and withholding information. Patient denied SI or HI/AVH, delusions, paranoia.  However has been present at interview, reported the patient has had hyper religious delusions, exhibited paranoid delusions, along with decreased need for sleep (3 hours a night), and not eating for the past 6 days.  Upon further discussion with husband alone, about patient's past history.  He reported that she has had episodes similar to this, every 1.5 years.  However not to this extent.  In the past she has had symptoms of exessive spending. She has also had multiple hospitalizations in the past since her mid-50s.  Plan per above  Signed: Merrily Brittle, DO Psychiatry Resident,  PGY-1 [place] 06/07/2022, 5:29 PM

## 2022-06-07 NOTE — ED Notes (Signed)
[facility] Distribution called to collect specimens and to deliver to [facility].  Specimens collected via L AC x 1 stick, Pt tolerated well.

## 2022-06-07 NOTE — Progress Notes (Signed)
Pt was accepted to [facility] 06/07/22; Bed Assignment Acute Gero Unit.  -CSW has agreed to send labs.  Pt meets inpatient criteria per Merrily Brittle, DO  Attending Physician will be Dr. Gerrit Halls  Report can be called to: [phone].  Pt can arrive after 8:00am  Care Team notified: Donell Beers, RN, and Molinda Bailiff. Corena Pilgrim, RN.  Nadara Mode, [place] 06/07/2022 @ 5:15 PM

## 2022-06-07 NOTE — ED Notes (Signed)
[employer] requested to [employer], [place].

## 2022-06-07 NOTE — ED Notes (Signed)
Pt sleeping'@this'$  time. Pt calm and cooperative. No c/o pain or distress. Will continue to monitor for safety

## 2022-06-07 NOTE — Progress Notes (Signed)
Patient was brought to the [facility] by her husband. Patient has not been eating since Tuesday or taking her medications. Patient has a UTI and hypothyroidism and not taking her medications. Patient is anxious and pacing all night. Patient is confused, removing all decorations in the home as well as pictures and putting them up out of sight. Patient has repeated told husband that she is scared and overwhelmed. Patient is depressed and change in mental status. Patient is doing strange things, but not suicidal or homicidal. No drug or alcohol use. Prior inpatient psych in April at [facility]. Patient is urgent.

## 2022-06-07 NOTE — ED Notes (Signed)
Pt awake and quietly resting at present, no distress noted.  Respirations even & unlabored.  Monitoring for safety.

## 2022-06-07 NOTE — Discharge Instructions (Addendum)
To see which pharmacy near you is the CHEAPEST for certain medications, please use GoodRx. It is free website and has a free phone app.      Also consider looking at [facility] $8.76 or [facility] $8.11 prescription list. Both are free to view if googled [facility]" and "[facility] $7 prescription". These are set prices, no insurance required.   For issues with sleep, please use this free app for insomnia called CBT-I. Let your doctors and therapists know so they can help with extra tips and tricks or for guidance and accountability. NO ADDS on the app.     For therapy outside the hospital, please ask for these specific types of therapy: CBT   Please make regular appointments with an outpatient psychiatrist and other doctors once you leave the hospital.    Suicide hotline: 988 Emergency: 911

## 2022-06-07 NOTE — ED Notes (Signed)
Pt served IVC paperwork.  Paperwork faxed to [facility].

## 2022-06-07 NOTE — ED Provider Notes (Cosign Needed)
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  History was obtained with husband in room. The history had to be obtained primarily through her husband due to limited engagement and cooperativity of patient throughout exam. Later husband and wife were separately interviewed to allow both to speak freely.   This episode of altered mental status started occurring last week when the patient started acting strangely at home. Her husband notes that she started believing that she was getting special messages from [place]. She began weirdly arranging things in her room, reports nausea, and has significantly reduced her oral intake to 1 to 2 tablespoons of food per day. Due to her limited oral intake, the patient stopped taking her Depakote & Clonazepam, and took only her Levothyroxine and Keflex.  Husband reports that she has had many episodes of SA, psychosis, and mania since she was 22, with numerous hospitalizations. This has become more frequent since 2019. Most recently, she was hospitalized at [facility] and started on Risperdal 2 mg qhs and Lorazepam BID due to c/f Bipolar 1 Disorder and PTSD. They felt her presentation was more consistent with panic disorder and severe anxiety, so they switched her to Seroquel and Klonopin. This led to improvement in her anxiety and sleep. On 5/23 she discontinued the Seroquel due to nightmares and was started on Depakote 500 mg daily with the plan to follow-up soon with an OP psychiatrist (Dr. Vianne Bulls). Most recently,  she was seen in the [facility] for worsened mood and resources and was diagnosed with a UTI for which she was prescribed Keflex. She was also seen [facility] where she was deemed psychiatrically stable and sent home, though some aspects of paranoid delusions were noted.  Patient denies in room that she has had decreased appetite and has had change in oral intake. When asked about why she is not eating any feelings she responds that she is not sure that this is the right thing to do. When asked what she's concerned about she says she is unsure. Denies that she has thoughts or voices that are telling her not to eat. She has also had trouble going to the bathroom, sometimes urinating on the floor. Husband is also concerned that the patient tried to run away with their dog and her insistence that he could not feed the dog.   Psych ROS and its fidelity are limited by patient engagement in exam. She endorses being anxious with racing, repetitive thoughts. She is sleeping for four hours a night. Patient denies auditory and visual hallucinations and delusions. Denies suicidal and homicidal ideation. The remainder of the psycho ROS was unable to be obtained.   PHQ 2-9:  [employer] from 06/21/2021 in [facility] Visit from 06/05/2021 in [facility] at [facility] that you would be better off dead, or of hurting yourself in some way Not at all Not at all  PHQ-9 Total Score 0 0       Flowsheet  [facility] from 06/04/2022 in [place] ED to Hosp-Admission (Discharged) from 11/26/2021 in [facility] PCU ED from 06/27/2021 in [place] High Risk No Risk No Risk        Total Time spent with patient: 1 hour  Musculoskeletal  Strength & Muscle Tone: within normal limits Gait & Station: normal Patient leans: N/A  Psychiatric  Specialty Exam  Presentation General Appearance: Fairly Groomed  Eye Contact:Fleeting  Speech:Clear and Coherent; Normal Rate  Speech Volume:Normal  Handedness:Right   Mood and Affect  Mood:Anxious  Affect:Congruent   Thought Process  Thought Processes:Coherent  Descriptions of Associations:Intact  Orientation:Full (Time, Place and Person)  Thought Content:Logical    Hallucinations:No data recorded Ideas of Reference:None  Suicidal Thoughts:No data recorded Homicidal Thoughts:No data recorded  Sensorium  Memory:Recent Fair; Immediate Fair; Remote [place]  Insight:Fair   Executive Functions  Concentration:Poor  Attention Span:Poor  [place]  Language:Good   Psychomotor Activity  Psychomotor Activity:No data recorded  [place]; Resilience; Social Support; Housing; Desire for Improvement   Sleep  Sleep:No data recorded  No data recorded  Physical Exam Vitals and nursing note reviewed.  Constitutional:      General: She is not in acute distress.    Appearance: Normal appearance. She is well-developed and normal weight.     Comments: Patient trembling in arms throughout interview, clinging on to husband. Frequently looking to him for reassurance and holding his hand.  HENT:     Head: Normocephalic and atraumatic.  Eyes:     Conjunctiva/sclera: Conjunctivae normal.  Cardiovascular:     Heart sounds: No murmur heard. Abdominal:     Palpations: Abdomen is soft.  Musculoskeletal:        General: Normal range of motion.     Cervical back: Neck supple.     Comments: tremulous  Skin:    General: Skin is warm and dry.  Neurological:     Mental Status: She is alert.    ROS  Blood pressure (!) 143/88, pulse 98, temperature 98.6 F (37 C), temperature source Oral, resp. rate 18, SpO2 96 %. There is no height or weight on file to calculate BMI.  Past Psychiatric History:   Husband reports multiple psychiatric hospitalizations for suicide attempts, psychosis, and mania dating back to when she turned 81.  Is the patient at risk to self? Yes  Has the patient been a risk to self in the past 6 months? Yes .    Has the patient been a risk to self within the distant past? Yes   Is the patient a risk to others? Yes   Has the patient been a risk to others in the past 6 months? Yes   Has the patient been a risk to others within the distant past?  unclear  Past Medical History:  Past Medical History:  Diagnosis Date   Anxiety and depression    Arthritis    Cervicogenic headache    Claustrophobia    Glaucoma    Hypertension    Lumbar radiculopathy    Postsurgical hypothyroidism    Spinal stenosis    Thyroid cancer [facility])    s/p thyroidectomy    Past Surgical History:  Procedure Laterality Date   FOOT SURGERY     LAMINOTOMY  04/03/2021   LEFT L3/4 LAMINOTOMY BILATERAL L3/4 MEDIAL FACETECTOMY   LUMBAR LAMINECTOMY  03/03/2018   THYROIDECTOMY  12/2004    Family History:  Family History  Problem Relation Age of Onset   Hypertension Sister    Thyroid cancer Brother    Skin cancer Brother    Diabetes Paternal Grandmother    Colon cancer Neg Hx    Breast cancer Neg Hx    Pancreatic cancer Neg Hx    Stomach cancer Neg Hx    Liver cancer Neg Hx    Esophageal cancer Neg Hx     Social History:  Social History   Socioeconomic History   Marital status: Married    Spouse name: Not on file   Number of children: 0   Years of education: Not on file   Highest education level: Master's degree (e.g., MA, MS, MEng, MEd, MSW, MBA)  Occupational History   Occupation: retired, Pharmacologist  Tobacco Use   Smoking status: Never   Smokeless tobacco: Never  Vaping Use   Vaping Use: Never used  Substance and Sexual Activity   Alcohol use: Yes    Comment: Rare   Drug use: Never   Sexual activity: Not Currently  Other Topics Concern   Not on file  Social  History Narrative   Moved from [place] June 2022   Lives w/ husband   Moved to stay close to her sister (Mrs Terance Hart)   Social Determinants of Health   Financial Resource Strain: Not on file  Food Insecurity: Not on file  Transportation Needs: Not on file  Physical Activity: Not on file  Stress: Not on file  Social Connections: Not on file  Intimate Partner Violence: Not on file    SDOH:  SDOH Screenings   Alcohol Screen: Not on file  Depression (PHQ2-9): Low Risk  (06/21/2021)   Depression (PHQ2-9)    PHQ-2 Score: 0  Financial Resource Strain: Not on file  Food Insecurity: Not on file  Housing: Not on file  Physical Activity: Not on file  Social Connections: Not on file  Stress: Not on file  Tobacco Use: Low Risk  (06/07/2022)   Patient History    Smoking Tobacco Use: Never    Smokeless Tobacco Use: Never    Passive Exposure: Not on file  Transportation Needs: Not on file    Last Labs:  Admission on 06/04/2022, Discharged on 06/04/2022  Component Date Value Ref Range Status   Sodium 06/04/2022 137  135 - 145 mmol/L Final   Potassium 06/04/2022 3.5  3.5 - 5.1 mmol/L Final   Chloride 06/04/2022 103  98 - 111 mmol/L Final   CO2 06/04/2022 20 (L)  22 - 32 mmol/L Final   Glucose, Bld 06/04/2022 111 (H)  70 - 99 mg/dL Final   Glucose reference range applies only to samples taken after fasting for at least 8 hours.   BUN 06/04/2022 18  8 - 23 mg/dL Final   Creatinine, Ser 06/04/2022 0.81  0.44 - 1.00 mg/dL Final   Calcium 06/04/2022 9.5  8.9 - 10.3 mg/dL Final   Total Protein 06/04/2022 7.5  6.5 - 8.1 g/dL Final   Albumin 06/04/2022 4.4  3.5 - 5.0 g/dL Final   AST 06/04/2022 24  15 - 41 U/L Final   ALT 06/04/2022 15  0 - 44 U/L Final   Alkaline Phosphatase 06/04/2022 63  38 - 126 U/L Final   Total Bilirubin 06/04/2022 1.3 (H)  0.3 - 1.2 mg/dL Final   GFR, Estimated 06/04/2022 >60  >60 mL/min Final   Comment: (NOTE) Calculated using the CKD-EPI Creatinine Equation  (  2021)    Anion gap 06/04/2022 14  5 - 15 Final   Performed at [facility], [address] [place], [place] [address]   Alcohol, Ethyl (B) 06/04/2022 <10  <10 mg/dL Final   Comment:        LOWEST DETECTABLE LIMIT FOR SERUM ALCOHOL IS 10 mg/dL FOR MEDICAL PURPOSES ONLY Performed at [facility], [address] [place], [place] [address]    Salicylate Lvl 29/52/8413 <7.0 (L)  7.0 - 30.0 mg/dL Final   Performed at [facility], [address] [place], [place] [address]   Acetaminophen (Tylenol), Serum 06/04/2022 <10 (L)  10 - 30 ug/mL Final   Performed at [facility], [address] [place], [place] [address]   WBC 06/04/2022 7.6  4.0 - 10.5 K/uL Final   RBC 06/04/2022 5.09  3.87 - 5.11 MIL/uL Final   Hemoglobin 06/04/2022 15.5 (H)  12.0 - 15.0 g/dL Final   HCT 06/04/2022 43.9  36.0 - 46.0 % Final   MCV 06/04/2022 86.2  80.0 - 100.0 fL Final   MCH 06/04/2022 30.5  26.0 - 34.0 pg Final   MCHC 06/04/2022 35.3  30.0 - 36.0 g/dL Final   RDW 06/04/2022 12.7  11.5 - 15.5 % Final   Platelets 06/04/2022 289  150 - 400 K/uL Final   nRBC 06/04/2022 0.0  0.0 - 0.2 % Final   Performed at [facility], [address] [place], [place] [address]   Opiates 06/04/2022 NONE DETECTED  NONE DETECTED Final   Cocaine 06/04/2022 NONE DETECTED  NONE DETECTED Final   Benzodiazepines 06/04/2022 NONE DETECTED  NONE DETECTED Final   Amphetamines 06/04/2022 NONE DETECTED  NONE DETECTED Final   Tetrahydrocannabinol 06/04/2022 NONE DETECTED  NONE DETECTED Final   Barbiturates 06/04/2022 NONE DETECTED  NONE DETECTED Final   Comment: (NOTE) DRUG SCREEN FOR MEDICAL PURPOSES ONLY.  IF CONFIRMATION IS NEEDED FOR ANY PURPOSE, NOTIFY LAB WITHIN 5 DAYS.  LOWEST DETECTABLE LIMITS FOR URINE DRUG SCREEN Drug Class                     Cutoff (ng/mL) Amphetamine and metabolites    1000 Barbiturate and metabolites    200 Benzodiazepine                  034 Tricyclics and metabolites     300 Opiates and metabolites        300 Cocaine and metabolites        300 THC                            50 Performed at [facility], [address] [place], [place] [address]    Lipase 06/04/2022 30  11 - 51 U/L Final   Performed at [facility], [address] [place], [place] [address]   Color, Urine 06/04/2022 AMBER (A)  YELLOW Final   BIOCHEMICALS MAY BE AFFECTED BY COLOR   APPearance 06/04/2022 HAZY (A)  CLEAR Final   Specific Gravity, Urine 06/04/2022 >=1.030  1.005 - 1.030 Final   pH 06/04/2022 5.0  5.0 - 8.0 Final   Glucose, UA 06/04/2022 NEGATIVE  NEGATIVE mg/dL Final   Hgb urine dipstick 06/04/2022 NEGATIVE  NEGATIVE Final   Bilirubin Urine 06/04/2022 SMALL (A)  NEGATIVE Final   Ketones, ur 06/04/2022 40 (A)  NEGATIVE mg/dL Final   Protein, ur 06/04/2022 NEGATIVE  NEGATIVE mg/dL Final   Nitrite 06/04/2022 NEGATIVE  NEGATIVE Final   Leukocytes,Ua 06/04/2022 TRACE (A)  NEGATIVE Final   Performed at [facility], [address] [place], [place] [address]   RBC / HPF 06/04/2022 0-5  0 - 5 RBC/hpf Final   WBC, UA 06/04/2022 21-50  0 - 5 WBC/hpf Final   Bacteria, UA 06/04/2022 MANY (A)  NONE SEEN Final   Squamous Epithelial / LPF 06/04/2022 0-5  0 - 5 Final   Mucus 06/04/2022 PRESENT   Final   Performed at [facility], [address] [place], [place] [address]    Allergies: Amoxicillin  PTA Medications: (Not in a hospital admission)   Medical Decision Making   Mrs. Dowlen is a 71 year-old female with PMH of thyroid cancer (s/p 2016 resection), hypothyroidism, chronic lower back pain, and vulvodynia with PPH c/f Bipolar 1 D, GAD, and Panic Disorder presenting with AMS.  The patient is presenting with delusions, bizarre behavior, decreased need for sleep, racing, repetitive thoughts, and anorexia in the setting of recent Seroquel and Depakote discontinuation and UTI dx treated with Keflex  (tx day 4 of 7). Given her hyperreligious and paranoid delusions and bizarre behavior she appears psychotic. Her decreased need for sleep and racing, repetitve thoughts are supportive of a potential manic component. Patient's anorexia could be due to an underlying medical condition (supported by report of nausea), depression, psychosis, or catatonia. Given patient is psychotic with inability to reliably take meds in the OP setting and severe anorexia, patient does not appear to be psychiatrically stable and requires further careful evaluation. Patient is not agreeable to hospitalization, so IVC paperwork was submitted.  - Zyprexa 2.5 mg ODT BID - Bactrim Twice Daily - Levothyroxine - Take 1 tablet (112 mcg total) by mouth as directed. Two on sundays and 1 tablet the rest of the week - IVC'd on 6/29 - requesting transfer to geriatric psychiatry  Recommendations  Based on my evaluation the patient appears to have an emergency medical condition for which I recommend the patient be transferred to the emergency department for further evaluation.   Cena Benton, Medical Student 06/07/22  2:45 PM   Cena Benton, Medical Student 06/07/22 [phone]

## 2022-06-07 NOTE — ED Notes (Signed)
Patient is alert but confused. Oriented to

## 2022-06-07 NOTE — Progress Notes (Signed)
Patient has been denied by [facility] due to acuity. Patient meets [facility] inpatient criteria per Merrily Brittle, NP. Patient has been faxed out to the following facilities:   [facility]  [address], [place] [address] [phone]  [place], [place] [address] [phone]  [name]  [place], [place] [address] [phone]  [facility]  [address] [place] [address] [phone] [facility]  [address] [place], [place] [address] [phone] [facility]  [address] [place] [address] [phone] [facility]  [address], [place] [address] [place]  [facility]  [address], [place] [address] [phone] [place]  [address] [place] [address] [phone] [place]  [address] [place] [address] [phone] [facility]  [place], [place] [address] [phone]  [facility]  [address] [place] [address] [phone] [facility]  [address] [place] [address] [phone]   Mariea Clonts, MSW, LCSW-A  3:40 PM 06/07/2022

## 2022-06-07 NOTE — Progress Notes (Signed)
CSW sent updated labs and nursing Miguel Rota, RN confirmed that IVC paperwork has been faxed to [facility]. CSW communicated with Caryl Pina with [facility] who advised that pt can transfer tomorrow for admission.  Benjaman Kindler, MSW, [facility] 06/07/2022 11:37 PM
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
## 2022-06-08 DIAGNOSIS — E890 Postprocedural hypothyroidism: Secondary | ICD-10-CM

## 2022-06-08 DIAGNOSIS — F22 Delusional disorders: Secondary | ICD-10-CM

## 2022-06-08 DIAGNOSIS — F419 Anxiety disorder, unspecified: Secondary | ICD-10-CM | POA: Diagnosis not present

## 2022-06-08 DIAGNOSIS — F32A Depression, unspecified: Secondary | ICD-10-CM | POA: Diagnosis not present

## 2022-06-08 MED ORDER — OLANZAPINE 5 MG PO TBDP
ORAL_TABLET | ORAL | Status: AC
  Filled 2022-06-08: qty 1

## 2022-06-08 NOTE — ED Notes (Signed)
Three copies of IVC, eMar, H&P and EKG placed in envelope for transfer to [facility]. Pt currently talking w/provider. Spouse informed yesterday of admission per provider. Safety maintained and will continue to monitor.
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
## 2022-06-08 NOTE — ED Notes (Signed)
Pt resting at present, no distress noted.  Monitoring for safety. 
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
## 2022-06-08 NOTE — ED Notes (Signed)
Report called to English as a second language teacher at [facility]. [facility] called and stated they are en route to pick Pt up for transfer.

## 2022-06-08 NOTE — ED Notes (Signed)
Discharge instructions provided and Pt stated understanding. Pt alert, orient and ambulatory prior to d/c from facility. No personal belongings to be returned at time of d/c from facility. Staff packed a snack for the ride. Paperwork given to [employer]. Pt escorted to the sally port. Safety maintained.

## 2022-08-17 ENCOUNTER — Emergency Department (HOSPITAL_COMMUNITY)
Admission: EM | Admit: 2022-08-17 | Discharge: 2022-08-21 | Disposition: A | Discharge status: Other Acute Inpatient Hospital | Payer: Medicare Other | Attending: Emergency Medicine | Admitting: Emergency Medicine

## 2022-08-17 ENCOUNTER — Other Ambulatory Visit: Payer: Self-pay

## 2022-08-17 ENCOUNTER — Encounter (HOSPITAL_COMMUNITY): Payer: Self-pay | Admitting: Emergency Medicine

## 2022-08-17 ENCOUNTER — Ambulatory Visit (INDEPENDENT_AMBULATORY_CARE_PROVIDER_SITE_OTHER)
Admission: EM | Admit: 2022-08-17 | Discharge: 2022-08-17 | Disposition: A | Discharge status: Other Acute Inpatient Hospital | Payer: Medicare Other | Source: Home / Self Care

## 2022-08-17 DIAGNOSIS — Z85850 Personal history of malignant neoplasm of thyroid: Secondary | ICD-10-CM | POA: Insufficient documentation

## 2022-08-17 DIAGNOSIS — Z91148 Patient's other noncompliance with medication regimen for other reason: Secondary | ICD-10-CM | POA: Insufficient documentation

## 2022-08-17 DIAGNOSIS — Z818 Family history of other mental and behavioral disorders: Secondary | ICD-10-CM | POA: Insufficient documentation

## 2022-08-17 DIAGNOSIS — F333 Major depressive disorder, recurrent, severe with psychotic symptoms: Secondary | ICD-10-CM | POA: Insufficient documentation

## 2022-08-17 DIAGNOSIS — E876 Hypokalemia: Secondary | ICD-10-CM | POA: Insufficient documentation

## 2022-08-17 DIAGNOSIS — I10 Essential (primary) hypertension: Secondary | ICD-10-CM | POA: Diagnosis not present

## 2022-08-17 DIAGNOSIS — F323 Major depressive disorder, single episode, severe with psychotic features: Secondary | ICD-10-CM | POA: Diagnosis not present

## 2022-08-17 DIAGNOSIS — Z79899 Other long term (current) drug therapy: Secondary | ICD-10-CM | POA: Insufficient documentation

## 2022-08-17 DIAGNOSIS — Z20822 Contact with and (suspected) exposure to covid-19: Secondary | ICD-10-CM | POA: Insufficient documentation

## 2022-08-17 DIAGNOSIS — F32A Depression, unspecified: Secondary | ICD-10-CM | POA: Diagnosis present

## 2022-08-17 DIAGNOSIS — F22 Delusional disorders: Secondary | ICD-10-CM | POA: Diagnosis present

## 2022-08-17 DIAGNOSIS — G4700 Insomnia, unspecified: Secondary | ICD-10-CM | POA: Insufficient documentation

## 2022-08-17 DIAGNOSIS — F339 Major depressive disorder, recurrent, unspecified: Secondary | ICD-10-CM

## 2022-08-17 DIAGNOSIS — F419 Anxiety disorder, unspecified: Secondary | ICD-10-CM | POA: Insufficient documentation

## 2022-08-17 DIAGNOSIS — Y900 Blood alcohol level of less than 20 mg/100 ml: Secondary | ICD-10-CM | POA: Diagnosis not present

## 2022-08-17 DIAGNOSIS — F418 Other specified anxiety disorders: Secondary | ICD-10-CM | POA: Insufficient documentation

## 2022-08-17 DIAGNOSIS — Z046 Encounter for general psychiatric examination, requested by authority: Secondary | ICD-10-CM | POA: Diagnosis present

## 2022-08-17 LAB — CBC WITH DIFFERENTIAL/PLATELET
Abs Immature Granulocytes: 0.02 10*3/uL (ref 0.00–0.07)
Basophils Absolute: 0 10*3/uL (ref 0.0–0.1)
Basophils Relative: 1 %
Eosinophils Absolute: 0 10*3/uL (ref 0.0–0.5)
Eosinophils Relative: 0 %
HCT: 42.9 % (ref 36.0–46.0)
Hemoglobin: 14.6 g/dL (ref 12.0–15.0)
Immature Granulocytes: 0 %
Lymphocytes Relative: 31 %
Lymphs Abs: 1.9 10*3/uL (ref 0.7–4.0)
MCH: 30.5 pg (ref 26.0–34.0)
MCHC: 34 g/dL (ref 30.0–36.0)
MCV: 89.7 fL (ref 80.0–100.0)
Monocytes Absolute: 0.4 10*3/uL (ref 0.1–1.0)
Monocytes Relative: 7 %
Neutro Abs: 3.6 10*3/uL (ref 1.7–7.7)
Neutrophils Relative %: 61 %
Platelets: 264 10*3/uL (ref 150–400)
RBC: 4.78 MIL/uL (ref 3.87–5.11)
RDW: 12 % (ref 11.5–15.5)
WBC: 5.9 10*3/uL (ref 4.0–10.5)
nRBC: 0 % (ref 0.0–0.2)

## 2022-08-17 LAB — COMPREHENSIVE METABOLIC PANEL
ALT: 15 U/L (ref 0–44)
AST: 19 U/L (ref 15–41)
Albumin: 4.2 g/dL (ref 3.5–5.0)
Alkaline Phosphatase: 61 U/L (ref 38–126)
Anion gap: 13 (ref 5–15)
BUN: 21 mg/dL (ref 8–23)
CO2: 20 mmol/L — ABNORMAL LOW (ref 22–32)
Calcium: 9.5 mg/dL (ref 8.9–10.3)
Chloride: 107 mmol/L (ref 98–111)
Creatinine, Ser: 0.82 mg/dL (ref 0.44–1.00)
GFR, Estimated: >60 mL/min (ref >60)
Glucose, Bld: 96 mg/dL (ref 70–99)
Potassium: 3.2 mmol/L — ABNORMAL LOW (ref 3.5–5.1)
Sodium: 140 mmol/L (ref 135–145)
Total Bilirubin: 1.3 mg/dL — ABNORMAL HIGH (ref 0.3–1.2)
Total Protein: 7.1 g/dL (ref 6.5–8.1)

## 2022-08-17 LAB — ETHANOL: Alcohol, Ethyl (B): <10 mg/dL (ref <10)

## 2022-08-17 LAB — RAPID URINE DRUG SCREEN, HOSP PERFORMED
Amphetamines: NOT DETECTED
Barbiturates: NOT DETECTED
Benzodiazepines: NOT DETECTED
Cocaine: NOT DETECTED
Opiates: NOT DETECTED
Tetrahydrocannabinol: NOT DETECTED

## 2022-08-17 NOTE — Progress Notes (Signed)
The charge nurse Deneise Lever was notified related to the arrival of [facility].

## 2022-08-17 NOTE — ED Notes (Signed)
Pt will now be sent via safe transport.  Provider approved transportation

## 2022-08-17 NOTE — ED Notes (Signed)
Report called to ed.  Non-emergent ems called to transport pt.

## 2022-08-17 NOTE — ED Provider Triage Note (Signed)
Emergency Medicine Provider Triage Evaluation Note  Alyssa [name] , a 70 y.o. female  was evaluated in triage.  Pt complains of anxiety/depression/psychosis.  Patient was sent here by [facility]; see note for further details.  In short, patient is experiencing more depression/anxiety and panic attacks at home.  She has been noncompliant with her medication.  She is also reports episodes leaving for 4 to 5 days at a time.  Their assessment was for inpatient psychiatric treatment and patient is agreeable to voluntary commitment.  Patient currently has no body complaints.  Denies suicidal homicidal ideation.  Denies auditory visual hallucinations.  Review of Systems  Positive: See above Negative:   Physical Exam  BP (!) 166/79 (BP Location: Left Arm)   Pulse 74   Temp 98 F (36.7 C) (Oral)   Resp 16   SpO2 100%  Gen:   Awake, no distress   Resp:  Normal effort  MSK:   Moves extremities without difficulty  Other:    Medical Decision Making  Medically screening exam initiated at 8:53 PM.  Appropriate orders placed.  Alyssa [name] was informed that the remainder of the evaluation will be completed by another provider, this initial triage assessment does not replace that evaluation, and the importance of remaining in the ED until their evaluation is complete.     Wilnette Kales, [place] 08/17/22 2059
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
## 2022-08-17 NOTE — ED Triage Notes (Signed)
Patient's husband reported patient's anorexia , insomnia with decreased self hygiene for several weeks  , patient with flat affect at encounter , denies SI or HI /no hallucinations .

## 2022-08-17 NOTE — ED Provider Notes (Signed)
Behavioral Health Urgent Care Medical Screening Exam  Patient Name: Alyssa [name] MRN: [id number] Date of Evaluation: 08/17/22 Chief Complaint:   Diagnosis:  Final diagnoses:  MDD (major depressive disorder), recurrent, severe, with psychosis ([place])    History of Present illness: Alyssa [name] is a 71 y.o. female. Patient presents voluntarily to [facility] behavioral health for walk-in assessment.  Patient is accompanied by her husband, Jenny Reichmann, who remains present during assessment as patient prefers. Patient is assessed, face-to-face, by nurse practitioner, seated in assessment area, no acute distress.  She  is alert and oriented, pleasant and minimally cooperative during assessment.   Patient  presents with anxious mood, flat affect. She  denies suicidal and homicidal ideations. Denies history of suicide attempts, denies history of non suicidal self-harm behavior.  Patient does not contract verbally for safety with this Probation officer.  Patient states "I think it would be better if were in a hospital."  Alyssa [name] has been diagnosed with anxiety and depression.  Per medical record also with paranoid delusion and psychosis.  She is scheduled for an upcoming initial appointment with outpatient [facility] on 08/23/2022.  She has met with a new counselor only once, did not adequately engage with counselor.  She is not compliant with medications at this time.  Patient was recently admitted to [facility] in July 2023.  She was prescribed Wellbutrin at that time.  She is currently compliant with medications including Synthroid, eyedrops, trazodone and tramadol as needed.  She endorses a total of 5 or more admissions in her life.  Family mental health history includes patient's mother who was diagnosed with bipolar disorder.  Patient has not been participating in self-care and ADLs.  Patient reports she has been eating and drinking very little over the last several days.   Patient has only had water to drink today.  She reports decreased appetite.  She has not bathed in the last 2 days.  She endorses decreased sleep, sleeping 4 to 5 hours at most, this is with the assistance of trazodone.  Approximately 1 week ago patient took the keys to the family car and left the family home residing in the car drinking only water for 4 days.  2 days ago patient became very anxious and insisted husband not leave the home to attend scheduled appointments.  Patient reports paranoid ideations.  Patient reports she "feels like I will have a panic attack if I leave the house."  On yesterday patient again insisted that her husband not leave the home for appointments and attempted to take the family car to leave the home without any destination in mind.  Patient presents with delayed speech, low speech volume.  Appropriate behavior.  She  denies auditory and visual hallucinations.  Alyssa [name] resides in [place] with her husband.  She denies access to weapons.  She is retired.  She denies alcohol and substance use.  Patient offered support and encouragement. Patient and her husband verbalize understanding of treatment plan to include medical clearance in the [facility] emergency department followed by inpatient psychiatric treatment.  Patient remains voluntary at this time.  Psychiatric Specialty Exam  Presentation  General Appearance:Appropriate for Environment; Casual  Eye Contact:Minimal  Speech:Slow  Speech Volume:Decreased  Handedness:Right   Mood and Affect  Mood:Anxious  Affect:Flat   Thought Process  Thought Processes:Coherent  Descriptions of Associations:Intact  Orientation:Full (Time, Place and Person)  Thought Content:Logical  Diagnosis of Schizophrenia or Schizoaffective disorder in past: No  Hallucinations:None AH "really pushing me to do things"  Ideas of Reference:None  Suicidal Thoughts:No  Homicidal Thoughts:No   Sensorium   Memory:Immediate Good; Recent Fair  Judgment:Intact  Insight:Present   Executive Functions  Concentration:Fair  Attention Span:Poor  [name]  Language:Good   Psychomotor Activity  Psychomotor Activity:Normal   Assets  Assets:Communication Skills; Desire for Improvement; Financial Resources/Insurance; Housing; Intimacy; Leisure Time; Resilience; Social Support   Sleep  Sleep:Poor  Number of hours: No data recorded  Nutritional Assessment (For OBS and FBC admissions only) Has the patient had a weight loss or gain of 10 pounds or more in the last 3 months?: No Has the patient had a decrease in food intake/or appetite?: Yes Does the patient have dental problems?: No Does the patient have eating habits or behaviors that may be indicators of an eating disorder including binging or inducing vomiting?: No Has the patient recently lost weight without trying?: 2.0 Has the patient been eating poorly because of a decreased appetite?: 1 Malnutrition Screening Tool Score: 3    Physical Exam: Physical Exam Vitals and nursing note reviewed.  Constitutional:      Appearance: Normal appearance. She is well-developed and normal weight.  HENT:     Head: Normocephalic and atraumatic.     Nose: Nose normal.  Cardiovascular:     Rate and Rhythm: Normal rate.  Pulmonary:     Effort: Pulmonary effort is normal.  Musculoskeletal:        General: Normal range of motion.     Cervical back: Normal range of motion.  Skin:    General: Skin is warm and dry.  Neurological:     Mental Status: She is alert and oriented to person, place, and time.  Psychiatric:        Attention and Perception: Attention normal.        Mood and Affect: Mood is anxious. Affect is flat.        Speech: Speech is delayed.        Behavior: Behavior is cooperative.        Thought Content: Thought content is paranoid.        Cognition and Memory: Cognition normal.    Review of  Systems  Constitutional: Negative.   HENT: Negative.    Eyes: Negative.   Respiratory: Negative.    Cardiovascular: Negative.   Gastrointestinal: Negative.   Genitourinary: Negative.   Musculoskeletal: Negative.   Skin: Negative.   Neurological: Negative.   Psychiatric/Behavioral:  The patient is nervous/anxious and has insomnia.    Blood pressure (!) 155/84, pulse 78, temperature 98.4 F (36.9 C), temperature source Oral, resp. rate 18, SpO2 100 %. There is no height or weight on file to calculate BMI.  Musculoskeletal: Strength & Muscle Tone: within normal limits Gait & Station: normal Patient leans: N/A   [facility] MSE Discharge Disposition for Follow up and Recommendations: Based on my evaluation the patient appears to have an emergency medical condition for which I recommend the patient be transferred to the emergency department for further evaluation.  Patient reviewed with Dr. Lynden Ang. Patient remains voluntary, inpatient psychiatric treatment recommended.  Patient excepted to [facility] emergency department by Dr. Ronnald Nian for medical clearance. Recommend consider olanzapine 2.5 mg QHS.   Lucky Rathke, FNP 08/17/2022, 5:04 PM

## 2022-08-18 DIAGNOSIS — F333 Major depressive disorder, recurrent, severe with psychotic symptoms: Secondary | ICD-10-CM | POA: Diagnosis not present

## 2022-08-18 LAB — RESP PANEL BY RT-PCR (FLU A&B, COVID) ARPGX2
Influenza A by PCR: NEGATIVE
Influenza B by PCR: NEGATIVE
SARS Coronavirus 2 by RT PCR: NEGATIVE

## 2022-08-18 MED ORDER — BUPROPION HCL ER (XL) 150 MG PO TB24
150.0000 mg | ORAL_TABLET | Freq: Every morning | ORAL | Status: DC
  Administered 2022-08-19 – 2022-08-21 (×3): 150 mg via ORAL
  Filled 2022-08-18 (×3): qty 1

## 2022-08-18 MED ORDER — LEVOTHYROXINE SODIUM 25 MCG PO TABS: 125.0000 ug | ORAL_TABLET | Freq: Every day | ORAL | Status: DC

## 2022-08-18 MED ORDER — POLYETHYLENE GLYCOL 3350 17 G PO PACK: 17.0000 g | PACK | Freq: Every day | ORAL | Status: DC | PRN

## 2022-08-18 MED ORDER — LEVOTHYROXINE SODIUM 112 MCG PO TABS
112.0000 ug | ORAL_TABLET | Freq: Every day | ORAL | Status: DC
Start: 2022-08-18 — End: 2022-08-22
  Administered 2022-08-19 – 2022-08-21 (×3): 112 ug via ORAL
  Filled 2022-08-18 (×3): qty 1

## 2022-08-18 MED ORDER — TRAZODONE HCL 50 MG PO TABS
50.0000 mg | ORAL_TABLET | Freq: Every evening | ORAL | Status: DC | PRN
  Administered 2022-08-18 – 2022-08-20 (×2): 50 mg via ORAL
  Filled 2022-08-18 (×2): qty 1

## 2022-08-18 MED ORDER — ONDANSETRON HCL 4 MG PO TABS: 4.0000 mg | ORAL_TABLET | Freq: Four times a day (QID) | ORAL | Status: DC | PRN

## 2022-08-18 MED ORDER — POTASSIUM CHLORIDE CRYS ER 20 MEQ PO TBCR
40.0000 meq | EXTENDED_RELEASE_TABLET | Freq: Once | ORAL | Status: AC
  Administered 2022-08-18: 40 meq via ORAL
  Filled 2022-08-18: qty 2

## 2022-08-18 MED ORDER — OLANZAPINE 5 MG PO TBDP
2.5000 mg | ORAL_TABLET | Freq: Two times a day (BID) | ORAL | Status: DC
  Administered 2022-08-18 – 2022-08-19 (×3): 2.5 mg via ORAL
  Filled 2022-08-18 (×3): qty 1

## 2022-08-18 MED ORDER — GABAPENTIN 300 MG PO CAPS: 300.0000 mg | ORAL_CAPSULE | Freq: Three times a day (TID) | ORAL | Status: DC | PRN

## 2022-08-18 MED ORDER — ACETAMINOPHEN 500 MG PO TABS
1000.0000 mg | ORAL_TABLET | Freq: Four times a day (QID) | ORAL | Status: DC | PRN
  Administered 2022-08-18: 1000 mg via ORAL
  Filled 2022-08-18: qty 2

## 2022-08-18 NOTE — ED Provider Notes (Signed)
[facility] EMERGENCY DEPARTMENT Provider Note   CSN: [id number] Arrival date & time: 08/17/22  1859     History  Chief Complaint  Patient presents with   Anxiety /Depression     Alyssa [name] is a 71 y.o. female.  The history is provided by the patient and medical records.   71 y.o. F with hx thyroid cancer s/p thyroidectomy, HTN, glaucoma, anxiety, depression, presenting to the ED from [facility] for medical clearance.  Reportedly she has had worsening anxiety and depression, not eating for the past several days, has been taking off in the vehicle and having to be found by police.  Husband reported she has been having violent outbursts and is "not herself".  It appears that she has been noncompliant with her medications since last hospitalization in June.  Due to her age and comorbidities, sent here for medical clearance.  Home Medications Prior to Admission medications   Medication Sig Start Date End Date Taking? Authorizing Provider  gabapentin (NEURONTIN) 300 MG capsule Take 300 mg by mouth 3 (three) times daily as needed (For pain).    [provider]  levothyroxine (SYNTHROID) 125 MCG tablet Take 125 mcg by mouth daily. 01/15/22   [provider]  OLANZapine zydis (ZYPREXA) 5 MG disintegrating tablet Take 0.5 tablets (2.5 mg total) by mouth 2 (two) times daily. 06/07/22   Merrily Brittle, DO      Allergies    Amoxicillin    Review of Systems   Review of Systems  Psychiatric/Behavioral:  Positive for behavioral problems.   All other systems reviewed and are negative.   Physical Exam Updated Vital Signs BP 131/85 (BP Location: Left Arm)   Pulse 69   Temp 98.4 F (36.9 C) (Oral)   Resp 18   SpO2 100%   Physical Exam Vitals and nursing note reviewed.  Constitutional:      Appearance: She is well-developed.  HENT:     Head: Normocephalic and atraumatic.  Eyes:     Conjunctiva/sclera: Conjunctivae normal.     Pupils: Pupils are equal,  round, and reactive to light.  Cardiovascular:     Rate and Rhythm: Normal rate and regular rhythm.     Heart sounds: Normal heart sounds.  Pulmonary:     Effort: Pulmonary effort is normal.     Breath sounds: Normal breath sounds.  Abdominal:     General: Bowel sounds are normal.     Palpations: Abdomen is soft.  Musculoskeletal:        General: Normal range of motion.     Cervical back: Normal range of motion.  Skin:    General: Skin is warm and dry.  Neurological:     Mental Status: She is alert and oriented to person, place, and time.  Psychiatric:     Comments: Calm, talking with husband at bedside Denies SI/HI     ED Results / Procedures / Treatments   Labs (all labs ordered are listed, but only abnormal results are displayed) Labs Reviewed  COMPREHENSIVE METABOLIC PANEL - Abnormal; Notable for the following components:      Result Value   Potassium 3.2 (*)    CO2 20 (*)    Total Bilirubin 1.3 (*)    All other components within normal limits  RESP PANEL BY RT-PCR (FLU A&B, COVID) ARPGX2  ETHANOL  RAPID URINE DRUG SCREEN, HOSP PERFORMED  CBC WITH DIFFERENTIAL/PLATELET    EKG None  Radiology No results found.  Procedures Procedures  Medications Ordered in ED Medications  potassium chloride SA (KLOR-CON M) CR tablet 40 mEq (40 mEq Oral Given 08/18/22 0256)    ED Course/ Medical Decision Making/ A&P                           Medical Decision Making Risk Prescription drug management.   71 y.o. F presenting to the ED from [facility] for medical clearance due to age and co morbidities.  Initially presented to [facility] due to worsening anxiety and depression, aggressive outbursts, and other changes in behavior.  She is awake and alert, vitals are stable.  No physical complaints.  Denies SI/HI/AVH.  Screening labs were obtained--no anemia or significant electrolyte derangement.  Potassium is mildly low at 3.2.  Will be given oral replacement.  It does appear she has  thyroid disease status post thyroidectomy, has apparently been noncompliant with her medications recently.  Most recent TSH 08/02/2022 was 7.55.  Being that she has been noncompliant and last check was just over 2 weeks ago, I do not feel this needs to be repeat as unlikely to have changed.  This should not preclude psychiatric admission however.  Patient is medically cleared. Awaiting psychiatric admission.  Final Clinical Impression(s) / ED Diagnoses Final diagnoses:  Recurrent major depressive disorder, remission status unspecified ([place])  Hypokalemia    Rx / DC Orders ED Discharge Orders     None         Larene Pickett, PA-C 08/18/22 [place], April, MD 08/18/22 [phone]

## 2022-08-18 NOTE — ED Notes (Signed)
TTS done at [facility] before arriving in the ED. Per MD, pt waiting for placement at other facility

## 2022-08-18 NOTE — ED Notes (Signed)
The pt will not lie down to sleep  and she will not cover with a blanket, she just sits on the stretcher

## 2022-08-18 NOTE — ED Provider Notes (Addendum)
Emergency Medicine Observation Re-evaluation Note  Alyssa [name] is a 71 y.o. female, seen on rounds today.  Pt initially presented to the ED for complaints of Anxiety /Depression  Currently, the patient is sitting on the side of the bed in no acute distress.Marland Kitchen  Physical Exam  BP (!) 148/76 (BP Location: Right Arm)   Pulse 66   Temp 98.7 F (37.1 C) (Oral)   Resp 16   SpO2 99%  Physical Exam General: Alert and oriented.  I have reviewed the vitals which are stable. Psych: Denies SI, HI, AVH.  No aggressive behavior throughout the shift.  Answers appropriately.  Does not appear to be responding to internal stimuli.  ED Course / MDM  EKG:EKG Interpretation  Date/Time:  Friday August 17 2022 21:11:01 EDT Ventricular Rate:  72 PR Interval:  124 QRS Duration: 92 QT Interval:  394 QTC Calculation: 431 R Axis:   77 Text Interpretation: Normal sinus rhythm Normal ECG When compared with ECG of 26-Nov-2021 08:41, PREVIOUS ECG IS PRESENT No significant change since last tracing Confirmed by Octaviano Glow [phone]) on 08/18/2022 10:48:02 AM  I have reviewed the labs performed to date as well as medications administered while in observation.  Recent changes in the last 24 hours include none.  I ordered her home olanzapine.  She is pending inpatient psychiatric admission.  Plan  Current plan is for inpatient admission.    Mickie Hillier, PA-C 08/18/22 1323    Mickie Hillier, PA-C 08/18/22 1345    Ezequiel Essex, MD 08/18/22 [phone]

## 2022-08-18 NOTE — ED Notes (Signed)
Pt undressed and placed in  blue scrubs that fit her more than burgundy scrubs  clothes and jewelry  placed in a belongings bag  the husband may take with him

## 2022-08-18 NOTE — ED Notes (Signed)
The pt appears very apprehensive and nervous  her husband at the bedside reports that the pt has not had an appetite for several days has stopped taking some of her mdicines.the pt will not lie back on the stretcher.  She appears to be frightened.  She denies si or hi

## 2022-08-18 NOTE — ED Provider Notes (Signed)
Attempted to page and contact [facility] APP team and [facility] TTS APP without success. TTS order for patient placed 08/18/2022 at 0144. Still has not been evaluated. Pending recommendation for placement. Stable in ED at this time.    Mickie Hillier, PA-C 08/18/22 1349    Ezequiel Essex, MD 08/18/22 [phone]

## 2022-08-18 NOTE — Progress Notes (Signed)
Per Merlyn Lot, NP, via secure chat, patient meets criteria for inpatient treatment. There are no available beds at [facility] today. CSW faxed referrals to the following facilities for review:  [facility]  Pending - No Request Sent N/A [address] [place] [address] [phone] --  [facility]  Pending - No Request Sent N/A [address] [place] [address] [phone] --  [facility]  Pending - No Request Sent N/A [address] [place] [address] [phone] --  [facility]  Pending - No Request Sent N/A [address] [place], [place] [address] [phone] --  [facility]  Pending - No Request Sent N/A [address] [place] [address] [phone] --  [place]  Pending - No Request Sent N/A [address] [place] [address] [phone] --  [facility]  Pending - No Request Sent N/A [address], [place] [address] [phone] --  [facility]  Pending - No Request Sent N/A [address], [place] [address] [phone] --  [facility]  Pending - No Request Sent N/A [address] [place] [address] [phone] --   TTS will continue to seek bed placement.  Glennie Isle, MSW, [facility] Phone: [phone] Disposition/TOC

## 2022-08-18 NOTE — ED Notes (Signed)
Pt c/o lower back pain and bilat leg pain that is chronic. Made provider aware and they are at bedside

## 2022-08-18 NOTE — ED Notes (Signed)
Received verbal report from Katrina Y RN at this time °

## 2022-08-19 DIAGNOSIS — F323 Major depressive disorder, single episode, severe with psychotic features: Secondary | ICD-10-CM

## 2022-08-19 DIAGNOSIS — F333 Major depressive disorder, recurrent, severe with psychotic symptoms: Secondary | ICD-10-CM | POA: Diagnosis not present

## 2022-08-19 MED ORDER — QUETIAPINE FUMARATE 25 MG PO TABS
12.5000 mg | ORAL_TABLET | Freq: Two times a day (BID) | ORAL | Status: DC
  Administered 2022-08-19: 12.5 mg via ORAL
  Filled 2022-08-19: qty 1

## 2022-08-19 NOTE — Consult Note (Signed)
Telepsych Consultation   Reason for Consult:   Psychiatric Reassessment  Referring Physician:  ED Provider Location of Patient:    [facility] Location of Provider: Other: virtual home office  Patient Identification: CHARESE ABUNDIS MRN:  [id number] Principal Diagnosis: Major depressive disorder with psychotic features [facility]) Diagnosis:  Principal Problem:   Major depressive disorder with psychotic features ([place]) Active Problems:   Anxiety and depression   Paranoid delusion ([place])   Total Time spent with patient: 30 minutes  Subjective:   LIVELY HABERMAN is a 71 y.o. female patient admitted with anxiety/depression, paranoid and delusional behaviors; sent to [place] for medical clearance.  HPI:   Patient seen via telepsych by this provider; chart reviewed and consulted with Dr. Caswell Corwin on 08/19/22.  On evaluation JANECIA PALAU who alert and oriented x4; pt is initially guarded about answering questions.  After providing anticipatory guidance pt is less guarded and more willing to answer questions.  When asked why she's in the hospital, she reports she's been dealing with depression and anxiety for most of her life; symptoms exacerbated within the past 3 months triggered by "relationship problems" that she does not want to elaborate on.  Also states she hasn't been eating or sleeping well and has not been bathing that much.  Since admission and restarting psych medications states she's now with bathing but still does not have a good appetite.  She reports she takes wellbturin for depression, admits she missed days of this medication prior to to admission but unsure how many.  She reports she used to take clonazepam as well but states her outpatient provider stopped this, "because it can cause addiction."    Reports she did trial zyprexa and abilify as adjunct to wellbutrin but eventually stopped d/t weight gain. She does not want to restart either of these medications.  Pt is open to  starting seroquel to help with depression, irritability and agrees she would benefit from admissions where she can be monitored for mood stability and safety. Pt is slow to respond to questions and appears she's responding to internal stimulus.  However, she tries to participate in assessment but is limited by he mental health condition.  During evaluation KJERSTEN ORMISTON is seated on chair in exam room; She is alert/oriented x 4; calm/cooperative; and mood congruent with affect.  Patient is speaking in a clear tone at moderate volume, and normal pace; with good eye contact.  Her thought process is coherent and relevant; she appears to be responding to internal stimulus and is slow to respond to questions.  Pt delusional thought content. She denies suicidal/self-harm/homicidal ideation.  Patient has remained calm throughout assessment and has answered questions appropriately.    Pts husband is outside of pt room during assessment.  Pt agrees to his return to participate in assessment to provide collateral. He collaborates above concerns and states he would like patient admitted for medication adjustments.  He does not provide too much details appears he does not want to upset his wife.  While communicating with this Probation officer, he is seen trying to rub patient's shoulders but patient is pushing him away.    Per ED Provider Admission Assessment 08/18/2022: Chief Complaint  Patient presents with   Anxiety /Depression       KEISHLA OYER is a 71 y.o. female.   The history is provided by the patient and medical records.    71 y.o. F with hx thyroid cancer s/p thyroidectomy, HTN, glaucoma, anxiety,  depression, presenting to the ED from [facility] for medical clearance.  Reportedly she has had worsening anxiety and depression, not eating for the past several days, has been taking off in the vehicle and having to be found by police.  Husband reported she has been having violent outbursts and is "not herself".  It  appears that she has been noncompliant with her medications since last hospitalization in June.  Due to her age and comorbidities, sent here for medical clearance.  Per [facility] Assessment on 08/17/2022: Patient is a 71 year old female that presents this date voluntary to [facility] brought in by her husband who provides collateral. Patient is observed to be somewhat disorganized and appears at times not to process the content of this writer's questions. Patient will often refer to her husband for assistance when she is asked a question. Patient has a history significant for depression, anxiety and has been delusional in the past. Per chart review patient has been seen multiple times before presenting with similar symptoms. Patient was seen and assessed on 6/29 when she presented to [facility] with similar symptoms and met inpatient criteria at that time. Patient presents back this date reporting (per husband) that she has not been eating or drinking the last three days and this past week patient was reported to  have left her residence and drove to an unknown location in [place] for 4 days where she lived in her car until she was located. Husband reports that patient has been having violent outbursts since she returned and has not "been acting like herself since then." Patient denies any S/I, H/I or AVH this date. Patient is prescribed Wellbutrin 150 mg from her provider at [facility] and states she takes that as indicated. Patient renders limited history and speaks in a low soft voice that is difficult to understand. Patient denies any SA issues. Per chart review patient has a history of hypothyroidism and frequent UTI's.     Patient is oriented x 3. Patient speaks in a low soft voice that is difficult to understand. Patient renders limited history and often refers to her husband to answer assessment questions. Patient's memory appears to be recent impaired with thoughts somewhat disorganized. Patient's mood is  depressed with affect congruent. Patient does not appear to be responding to internal stimuli.     Past Psychiatric History: Depression, Anxiety, PTSD related to childhood trauma  Risk to Self:  yes Risk to Others:  yes Prior Inpatient Therapy: no  Prior Outpatient Therapy:  yes  Past Medical History:  Past Medical History:  Diagnosis Date   Anxiety and depression    Arthritis    Cervicogenic headache    Claustrophobia    Glaucoma    Hypertension    Lumbar radiculopathy    Postsurgical hypothyroidism    Spinal stenosis    Thyroid cancer ([place])    s/p thyroidectomy    Past Surgical History:  Procedure Laterality Date   FOOT SURGERY     LAMINOTOMY  04/03/2021   LEFT L3/4 LAMINOTOMY BILATERAL L3/4 MEDIAL FACETECTOMY   LUMBAR LAMINECTOMY  03/03/2018   THYROIDECTOMY  12/2004   Family History:  Family History  Problem Relation Age of Onset   Hypertension Sister    Thyroid cancer Brother    Skin cancer Brother    Diabetes Paternal Grandmother    Colon cancer Neg Hx    Breast cancer Neg Hx    Pancreatic cancer Neg Hx    Stomach cancer Neg Hx  Liver cancer Neg Hx    Esophageal cancer Neg Hx    Family Psychiatric  History: unknown Social History:  Social History   Substance and Sexual Activity  Alcohol Use Yes   Comment: Rare     Social History   Substance and Sexual Activity  Drug Use Never    Social History   Socioeconomic History   Marital status: Married    Spouse name: Not on file   Number of children: 0   Years of education: Not on file   Highest education level: Master's degree (e.g., MA, MS, MEng, MEd, MSW, MBA)  Occupational History   Occupation: retired, Pharmacologist  Tobacco Use   Smoking status: Never   Smokeless tobacco: Never  Vaping Use   Vaping Use: Never used  Substance and Sexual Activity   Alcohol use: Yes    Comment: Rare   Drug use: Never   Sexual activity: Not Currently  Other Topics Concern   Not on file  Social History  Narrative   Moved from [place] June 2022   Lives w/ husband   Moved to stay close to her sister (Mrs Terance Hart)   Social Determinants of Health   Financial Resource Strain: Not on file  Food Insecurity: Not on file  Transportation Needs: Not on file  Physical Activity: Not on file  Stress: Not on file  Social Connections: Not on file   Additional Social History:    Allergies:   Allergies  Allergen Reactions   Amoxicillin Hives    Labs:  Results for orders placed or performed during the hospital encounter of 08/17/22 (from the past 48 hour(s))  Resp Panel by RT-PCR (Flu A&B, Covid) Anterior Nasal Swab     Status: None   Collection Time: 08/18/22  3:00 AM   Specimen: Anterior Nasal Swab  Result Value Ref Range   SARS Coronavirus 2 by RT PCR NEGATIVE NEGATIVE    Comment: (NOTE) SARS-CoV-2 target nucleic acids are NOT DETECTED.  The SARS-CoV-2 RNA is generally detectable in upper respiratory specimens during the acute phase of infection. The lowest concentration of SARS-CoV-2 viral copies this assay can detect is 138 copies/mL. A negative result does not preclude SARS-Cov-2 infection and should not be used as the sole basis for treatment or other patient management decisions. A negative result may occur with  improper specimen collection/handling, submission of specimen other than nasopharyngeal swab, presence of viral mutation(s) within the areas targeted by this assay, and inadequate number of viral copies(<138 copies/mL). A negative result must be combined with clinical observations, patient history, and epidemiological information. The expected result is Negative.  Fact Sheet for Patients:  EntrepreneurPulse.com.au  Fact Sheet for Healthcare Providers:  IncredibleEmployment.be  This test is no t yet approved or cleared by the [place] FDA and  has been authorized for detection and/or diagnosis of SARS-CoV-2 by FDA under an  Emergency Use Authorization (EUA). This EUA will remain  in effect (meaning this test can be used) for the duration of the COVID-19 declaration under Section 564(b)(1) of the Act, 21 U.S.C.section 360bbb-3(b)(1), unless the authorization is terminated  or revoked sooner.       Influenza A by PCR NEGATIVE NEGATIVE   Influenza B by PCR NEGATIVE NEGATIVE    Comment: (NOTE) The Xpert Xpress SARS-CoV-2/FLU/RSV plus assay is intended as an aid in the diagnosis of influenza from Nasopharyngeal swab specimens and should not be used as a sole basis for treatment. Nasal washings and aspirates are unacceptable for  Xpert Xpress SARS-CoV-2/FLU/RSV testing.  Fact Sheet for Patients: EntrepreneurPulse.com.au  Fact Sheet for Healthcare Providers: IncredibleEmployment.be  This test is not yet approved or cleared by the [place] FDA and has been authorized for detection and/or diagnosis of SARS-CoV-2 by FDA under an Emergency Use Authorization (EUA). This EUA will remain in effect (meaning this test can be used) for the duration of the COVID-19 declaration under Section 564(b)(1) of the Act, 21 U.S.C. section 360bbb-3(b)(1), unless the authorization is terminated or revoked.  Performed at [facility], [address], [place], [place] [address]     Medications:  Current Facility-Administered Medications  Medication Dose Route Frequency Provider Last Rate Last Admin   acetaminophen (TYLENOL) tablet 1,000 mg  1,000 mg Oral Q6H PRN Pati Gallo S, PA   1,000 mg at 08/18/22 2303   buPROPion (WELLBUTRIN XL) 24 hr tablet 150 mg  150 mg Oral q morning Merlyn Lot E, NP   150 mg at 08/19/22 5784   gabapentin (NEURONTIN) capsule 300 mg  300 mg Oral TID PRN Mallie Darting, NP       levothyroxine (SYNTHROID) tablet 112 mcg  112 mcg Oral Q0600 Merlyn Lot E, NP   112 mcg at 08/19/22 0620   ondansetron (ZOFRAN) tablet 4 mg  4 mg Oral Q6H PRN Pati Gallo S, PA       polyethylene glycol (MIRALAX / GLYCOLAX) packet 17 g  17 g Oral Daily PRN Fond[place], Wylder S, PA       QUEtiapine (SEROQUEL) tablet 12.5 mg  12.5 mg Oral BID Merlyn Lot E, NP       traZODone (DESYREL) tablet 50 mg  50 mg Oral QHS PRN Merlyn Lot E, NP   50 mg at 08/18/22 2303   Current Outpatient Medications  Medication Sig Dispense Refill   buPROPion (WELLBUTRIN XL) 150 MG 24 hr tablet Take 150 mg by mouth every morning.     clonazePAM (KLONOPIN) 0.5 MG tablet Take 0.5 mg by mouth 2 (two) times daily as needed for anxiety.     SYNTHROID 112 MCG tablet Take by mouth.     traZODone (DESYREL) 50 MG tablet Take 50 mg by mouth at bedtime as needed for sleep.     OLANZapine zydis (ZYPREXA) 5 MG disintegrating tablet Take 0.5 tablets (2.5 mg total) by mouth 2 (two) times daily. (Patient not taking: Reported on 08/18/2022)      Musculoskeletal:pt moves all extremities and ambulates independently Strength & Muscle Tone: within normal limits Gait & Station: normal Patient leans: N/A   Psychiatric Specialty Exam:  Presentation  General Appearance: Bizarre; Fairly Groomed  Eye Contact:Good  Speech:Clear and Coherent  Speech Volume:Normal  Handedness:Right   Mood and Affect  Mood:Dysphoric; Anxious  Affect:Blunt; Congruent   Thought Process  Thought Processes:Coherent  Descriptions of Associations:Intact  Orientation:Full (Time, Place and Person)  Thought Content:Logical  History of Schizophrenia/Schizoaffective disorder:No  Duration of Psychotic Symptoms:N/A  Hallucinations:Hallucinations: None  Ideas of Reference:Delusions; Paranoia  Suicidal Thoughts:Suicidal Thoughts: No  Homicidal Thoughts:Homicidal Thoughts: No   Sensorium  Memory:Immediate Good; Recent Fair; Remote Fair  Judgment:Impaired  Insight:Fair   Executive Functions  Concentration:Fair  Attention Span:Fair  [place]  Language:Good   Psychomotor Activity  Psychomotor Activity:Psychomotor Activity: Normal   Assets  Assets:Communication Skills; Housing; Social Support; Financial Resources/Insurance   Sleep  Sleep:Sleep: Fair Number of Hours of Sleep: 6    Physical Exam: Physical Exam Cardiovascular:     Rate and  Rhythm: Normal rate.     Pulses: Normal pulses.  Pulmonary:     Effort: Pulmonary effort is normal.  Musculoskeletal:     Cervical back: Normal range of motion.  Neurological:     Mental Status: She is alert and oriented to person, place, and time.  Psychiatric:        Attention and Perception: Attention normal.        Mood and Affect: Mood is depressed. Affect is blunt.        Speech: Speech is delayed.        Behavior: Behavior is slowed. Behavior is cooperative.        Thought Content: Thought content is paranoid and delusional.        Cognition and Memory: Cognition and memory normal.        Judgment: Judgment is impulsive.    Review of Systems  Constitutional: Negative.   HENT: Negative.    Eyes: Negative.   Respiratory: Negative.    Cardiovascular: Negative.   Gastrointestinal: Negative.   Genitourinary: Negative.   Musculoskeletal: Negative.   Skin: Negative.   Neurological: Negative.   Endo/Heme/Allergies: Negative.   Psychiatric/Behavioral:  Positive for depression. Negative for suicidal ideas.    Blood pressure 137/64, pulse 70, temperature 98.1 F (36.7 C), temperature source Oral, resp. rate 15, SpO2 100 %. There is no height or weight on file to calculate BMI.  Treatment Plan Summary: Pt with hx of depression, anxiety, PTSD presents mentally decompensated in the setting of psychosocial stressors and medication non-compliance. Patient is paranoid, delusional and is not at her baseline.  He has a hx of thyroid insufficiencies, most recent TSH completed 08/02/2022 at [facility] was 7.55.  Levels is evaluated trending in the right  direction as previous TSH completed 06/07/22 was 26.370.  She takes levothyroxine and is managed by her PCP. Do not believe TSH level is contributing to paranoia and delusions. Given this, will recommend continued inpatient admission where she can be monitored for safety and have medications adjusted.  Above discussed with pt and her spouse concordance.     Daily contact with patient to assess and evaluate symptoms and progress in treatment and Medication management EKG reviewed, no prolonged QT intervals.  U/A is +for leukocytes but nitrates are negative; pt has already been medically cleared.   Medications:  Discontinue olanzapine as pt does not want to take this medication. Continue Wellbutrin SL 196m po daily for depression Start seroquel 12.531mpo BID for irritability/  Disposition: Pt is recommended for geri psych admissions. [facility].    This service was provided via telemedicine using a 2-way, interactive audio and video technology.  Names of all persons participating in this telemedicine service and their role in this encounter. Name: JeJanalynn Ederole: Patient  Name: JoJackelyn Polingole: Pt's husband  Name: ShDuard Bradyils Role: PMHNP   ShMallie DartingNP 08/19/2022 10:20 PM

## 2022-08-19 NOTE — ED Provider Notes (Signed)
Emergency Medicine Observation Re-evaluation Note  Alyssa [name] is a 71 y.o. female, seen on rounds today.  Pt initially presented to the ED for complaints of Anxiety /Depression  Currently, the patient is awake resting in bed with no complaints.  Physical Exam  BP 131/71 (BP Location: Left Arm)   Pulse (!) 59   Temp 97.8 F (36.6 C)   Resp 14   SpO2 100%  Physical Exam General: Awake and alert no acute distress Cardiac: Regular rate and rhythm Lungs: No increased work of breathing Psych: Calm and cooperative  ED Course / MDM  EKG:EKG Interpretation  Date/Time:  Friday August 17 2022 21:11:01 EDT Ventricular Rate:  72 PR Interval:  124 QRS Duration: 92 QT Interval:  394 QTC Calculation: 431 R Axis:   77 Text Interpretation: Normal sinus rhythm Normal ECG When compared with ECG of 26-Nov-2021 08:41, PREVIOUS ECG IS PRESENT No significant change since last tracing Confirmed by Octaviano Glow [phone]) on 08/18/2022 10:48:02 AM  I have reviewed the labs performed to date as well as medications administered while in observation.  Recent changes in the last 24 hours include social work is attempting to find an inpatient facility and reached out yesterday.  Plan  Current plan is for inpatient placement, pending acceptance.    Ottie Glazier, DO 08/19/22 [phone]

## 2022-08-20 DIAGNOSIS — F323 Major depressive disorder, single episode, severe with psychotic features: Secondary | ICD-10-CM | POA: Diagnosis not present

## 2022-08-20 DIAGNOSIS — F333 Major depressive disorder, recurrent, severe with psychotic symptoms: Secondary | ICD-10-CM | POA: Diagnosis not present

## 2022-08-20 LAB — URINALYSIS, COMPLETE (UACMP) WITH MICROSCOPIC
Bilirubin Urine: NEGATIVE
Glucose, UA: NEGATIVE mg/dL
Hgb urine dipstick: NEGATIVE
Ketones, ur: 80 mg/dL — AB
Nitrite: NEGATIVE
Protein, ur: NEGATIVE mg/dL
Specific Gravity, Urine: 1.027 (ref 1.005–1.030)
pH: 5 (ref 5.0–8.0)

## 2022-08-20 MED ORDER — FOSFOMYCIN TROMETHAMINE 3 G PO PACK
3.0000 g | PACK | Freq: Once | ORAL | Status: AC
  Administered 2022-08-20: 3 g via ORAL
  Filled 2022-08-20: qty 3

## 2022-08-20 MED ORDER — QUETIAPINE FUMARATE 50 MG PO TABS
50.0000 mg | ORAL_TABLET | Freq: Every day | ORAL | Status: DC
  Administered 2022-08-20: 50 mg via ORAL
  Filled 2022-08-20: qty 1

## 2022-08-20 MED ORDER — LORAZEPAM 1 MG PO TABS
1.0000 mg | ORAL_TABLET | Freq: Once | ORAL | Status: AC
  Administered 2022-08-20: 1 mg via ORAL
  Filled 2022-08-20: qty 1

## 2022-08-20 NOTE — Progress Notes (Signed)
[facility] ED Progress Note  08/20/2022 6:23 PM Alyssa [name]  MRN:  [id number]   Subjective:   Patient seen today at [facility] ED for face-to-face reevaluation.  She tells me she did not sleep well last night, and is still having a lot of depression and anxiety.  Patient did express to me she is having relationship problems and feels like her and her husband are going to be separated soon.  I asked her if she is separated currently and she is unable to give me a direct answer. She then mentioned they are not living together, but later stated they still live together. Per chart review I do believe patient is having some paranoid delusions about husband leaving her/getting a divorce.  Patient was recently hospitalized at [facility] in July 2023 due to similar circumstances of medication noncompliance, decrease in ADLs, and possible paranoia/delusions.  Today she denies current suicidal or homicidal ideations.  She denies any auditory or visual hallucinations.  Patient does have delayed speech with low volume.  She does still live with her husband.  Patient continues to express wanting to receive inpatient psychiatric treatment.  She stated she wants to get back on her medications and "feel better and be happy again."   She is able to engage in a mostly logical and coherent conversation.  She does have delayed speech.  She is very minimal in her responses.  I do think she is having some paranoid delusions involving her husband, and not wanting to leave her home.  Increased her Seroquel to 50 mg nightly.  We will continue to recommend inpatient psychiatric treatment at this time.  Principal Problem: Major depressive disorder with psychotic features ([place]) Diagnosis:  Principal Problem:   Major depressive disorder with psychotic features ([place]) Active Problems:   Anxiety and depression   Paranoid delusion [facility])   ED Assessment Time Calculation: Start Time: 1400 Stop Time: 1420 Total Time in Minutes  (Assessment Completion): [place] Scale:  [place] ED from 08/17/2022 in [place] Most recent reading at 08/17/2022  8:36 PM ED from 08/17/2022 in [facility] Most recent reading at 08/17/2022  5:14 PM ED [facility] 06/07/2022 in [facility] Most recent reading at 06/07/2022  2:52 PM  C-SSRS RISK CATEGORY No Risk No Risk No Risk       Past Medical History:  Past Medical History:  Diagnosis Date   Anxiety and depression    Arthritis    Cervicogenic headache    Claustrophobia    Glaucoma    Hypertension    Lumbar radiculopathy    Postsurgical hypothyroidism    Spinal stenosis    Thyroid cancer ([place])    s/p thyroidectomy    Past Surgical History:  Procedure Laterality Date   FOOT SURGERY     LAMINOTOMY  04/03/2021   LEFT L3/4 LAMINOTOMY BILATERAL L3/4 MEDIAL FACETECTOMY   LUMBAR LAMINECTOMY  03/03/2018   THYROIDECTOMY  12/2004   Family History:  Family History  Problem Relation Age of Onset   Hypertension Sister    Thyroid cancer Brother    Skin cancer Brother    Diabetes Paternal Grandmother    Colon cancer Neg Hx    Breast cancer Neg Hx    Pancreatic cancer Neg Hx    Stomach cancer Neg Hx    Liver cancer Neg Hx    Esophageal cancer Neg Hx    Social History:  Social History  Substance and Sexual Activity  Alcohol Use Yes   Comment: Rare     Social History   Substance and Sexual Activity  Drug Use Never    Social History   Socioeconomic History   Marital status: Married    Spouse name: Not on file   Number of children: 0   Years of education: Not on file   Highest education level: Master's degree (e.g., MA, MS, MEng, MEd, MSW, MBA)  Occupational History   Occupation: retired, Pharmacologist  Tobacco Use   Smoking status: Never   Smokeless tobacco: Never  Vaping Use   Vaping Use: Never used  Substance and Sexual Activity   Alcohol use: Yes    Comment:  Rare   Drug use: Never   Sexual activity: Not Currently  Other Topics Concern   Not on file  Social History Narrative   Moved from [place] June 2022   Lives w/ husband   Moved to stay close to her sister (Mrs Terance Hart)   Social Determinants of Health   Financial Resource Strain: Not on file  Food Insecurity: Not on file  Transportation Needs: Not on file  Physical Activity: Not on file  Stress: Not on file  Social Connections: Not on file    Sleep: Fair  Appetite:  Fair  Current Medications: Current Facility-Administered Medications  Medication Dose Route Frequency Provider Last Rate Last Admin   acetaminophen (TYLENOL) tablet 1,000 mg  1,000 mg Oral Q6H PRN Pati Gallo S, PA   1,000 mg at 08/18/22 2303   buPROPion (WELLBUTRIN XL) 24 hr tablet 150 mg  150 mg Oral q morning Merlyn Lot E, NP   150 mg at 08/20/22 1130   gabapentin (NEURONTIN) capsule 300 mg  300 mg Oral TID PRN Mallie Darting, NP       levothyroxine (SYNTHROID) tablet 112 mcg  112 mcg Oral Q0600 Merlyn Lot E, NP   112 mcg at 08/20/22 1130   ondansetron (ZOFRAN) tablet 4 mg  4 mg Oral Q6H PRN Fondaw, Wylder S, PA       polyethylene glycol (MIRALAX / GLYCOLAX) packet 17 g  17 g Oral Daily PRN Fondaw, Wylder S, PA       QUEtiapine (SEROQUEL) tablet 50 mg  50 mg Oral QHS Vesta Mixer, NP       traZODone (DESYREL) tablet 50 mg  50 mg Oral QHS PRN Merlyn Lot E, NP   50 mg at 08/18/22 2303   Current Outpatient Medications  Medication Sig Dispense Refill   buPROPion (WELLBUTRIN XL) 150 MG 24 hr tablet Take 150 mg by mouth every morning.     clonazePAM (KLONOPIN) 0.5 MG tablet Take 0.5 mg by mouth 2 (two) times daily as needed for anxiety.     SYNTHROID 112 MCG tablet Take by mouth.     traZODone (DESYREL) 50 MG tablet Take 50 mg by mouth at bedtime as needed for sleep.     OLANZapine zydis (ZYPREXA) 5 MG disintegrating tablet Take 0.5 tablets (2.5 mg total) by mouth 2 (two) times daily. (Patient not  taking: Reported on 08/18/2022)      Lab Results:  Results for orders placed or performed during the hospital encounter of 08/17/22 (from the past 48 hour(s))  Urinalysis, Complete w Microscopic Urine, Clean Catch     Status: Abnormal   Collection Time: 08/20/22 11:30 AM  Result Value Ref Range   Color, Urine AMBER (A) YELLOW    Comment: BIOCHEMICALS MAY BE AFFECTED BY  COLOR   APPearance HAZY (A) CLEAR   Specific Gravity, Urine 1.027 1.005 - 1.030   pH 5.0 5.0 - 8.0   Glucose, UA NEGATIVE NEGATIVE mg/dL   Hgb urine dipstick NEGATIVE NEGATIVE   Bilirubin Urine NEGATIVE NEGATIVE   Ketones, ur 80 (A) NEGATIVE mg/dL   Protein, ur NEGATIVE NEGATIVE mg/dL   Nitrite NEGATIVE NEGATIVE   Leukocytes,Ua SMALL (A) NEGATIVE   RBC / HPF 0-5 0 - 5 RBC/hpf   WBC, UA 11-20 0 - 5 WBC/hpf   Bacteria, UA RARE (A) NONE SEEN   Squamous Epithelial / LPF 0-5 0 - 5   Mucus PRESENT    Non Squamous Epithelial 0-5 (A) NONE SEEN    Comment: Performed at [facility] [address] [place],  [address]    Blood Alcohol level:  Lab Results  Component Value Date   [facility] <10 08/17/2022   ETH <10 06/04/2022   Psychiatric Specialty Exam:  Presentation  General Appearance: Fairly Groomed  Eye Contact:Fair  Speech:Clear and Coherent  Speech Volume:Normal  Handedness:Right   Mood and Affect  Mood:Anxious  Affect:Congruent   Thought Process  Thought Processes:Coherent  Descriptions of Associations:Intact  Orientation:Full (Time, Place and Person)  Thought Content:Logical  History of Schizophrenia/Schizoaffective disorder:No  Duration of Psychotic Symptoms:N/A  Hallucinations:Hallucinations: None  Ideas of Reference:None  Suicidal Thoughts:Suicidal Thoughts: No  Homicidal Thoughts:Homicidal Thoughts: No   Sensorium  Memory:Immediate Fair  Judgment:Impaired  Insight:Fair   Executive Functions  Concentration:Fair  Attention Span:Fair  [place] of  Knowledge:Good  Language:Good   Psychomotor Activity  Psychomotor Activity:Psychomotor Activity: Normal   Assets  Assets:Communication Skills; Desire for Improvement; Physical Health; Resilience; Social Support   Sleep  Sleep:Sleep: Fair Number of Hours of Sleep: 6    Physical Exam: Physical Exam Neurological:     Mental Status: She is alert and oriented to person, place, and time.    Review of Systems  Psychiatric/Behavioral:  Positive for depression. The patient is nervous/anxious and has insomnia.   All other systems reviewed and are negative.  Blood pressure 137/71, pulse 66, temperature (!) 97 F (36.1 C), temperature source Oral, resp. rate 18, SpO2 100 %. There is no height or weight on file to calculate BMI.   Medical Decision Making: Pt case reviewed and discussed with Dr. Leverne Humbles. Pt continues to meet criteria for inpatient psychiatric treatment. EDP, RN, and LCSW notified of disposition. There is no bed availability at [facility], [place] has been notified to fax out.   Problem 1: Insomnia/paranoia - Seroquel 50 mg Qhs   Vesta Mixer, NP 08/20/2022, 6:23 PM

## 2022-08-20 NOTE — ED Notes (Signed)
[place] is extremely [facility] [place] [facility].  Husband is still at bedside and this has proven therapeutic so I have told them he can stay until I get her night time meds on board.   Per discussion with Dr. Matilde Sprang we gave '1mg'$  PO ativan to try and help the pt settle down. It took some convincing but she eventually took it.

## 2022-08-20 NOTE — ED Notes (Signed)
Breakfast order placed ?

## 2022-08-20 NOTE — ED Provider Notes (Signed)
Emergency Medicine Observation Re-evaluation Note  Alyssa [name] is a 71 y.o. female, seen on rounds today.  Pt initially presented to the ED for complaints of Anxiety /Depression  Currently, the patient is awake resting in bed with no complaints.  Physical Exam  BP 129/62   Pulse 64   Temp 98.4 F (36.9 C)   Resp 19   SpO2 100%  Physical Exam General: NAD Cardiac: Well perfused Lungs: No increased work of breathing Psych: Calm and cooperative  ED Course / MDM  EKG:EKG Interpretation  Date/Time:  Friday August 17 2022 21:11:01 EDT Ventricular Rate:  72 PR Interval:  124 QRS Duration: 92 QT Interval:  394 QTC Calculation: 431 R Axis:   77 Text Interpretation: Normal sinus rhythm Normal ECG When compared with ECG of 26-Nov-2021 08:41, PREVIOUS ECG IS PRESENT No significant change since last tracing Confirmed by Octaviano Glow [phone]) on 08/18/2022 10:48:02 AM  I have reviewed the labs performed to date as well as medications administered while in observation. Psychiatry evaluated in consultation and recommended inpatient geri psych.   Plan  Current plan is for inpatient placement, pending acceptance. Social work Scientist, research (physical sciences).     Regan Lemming, MD 08/20/22 [phone]

## 2022-08-20 NOTE — Progress Notes (Signed)
[place] Placement  Pt meets inpatient criteria per Vesta Mixer, NP. There are no available beds at [facility] per Dr. Weber Cooks. Referral was sent to the following facilities;    Destination Service Provider Address Phone Fax  [facility]  [address], [place] [address] [place]  [facility]  [address], [place] [address] [phone]  [facility]  [place], [place] [address] [phone]  [facility]  [address] [place], [place] [address] [phone] [facility]  [address], [place] [address] [phone] [place]  [address] [place] [address] [phone] [facility]  [address] [place] [address] [phone] [facility]  [place], [place] [address] [phone]  [facility]  [address] [place] [address] [phone]  [facility] [place] [address] [phone] [facility]  [place], [place] [address] [phone]  [facility]  [place] [address] [place] [address] [phone]  [facility]  [place] [address] [phone] [facility]  [address] [place] [address] [phone]  [facility]  [address] [place] [address] [phone] [place]  [address] [place] [address] [phone] [place] [address] [place]  [address] [phone]   Situation ongoing,  CSW will follow up.   Benjaman Kindler, MSW, LCSWA 08/20/2022  @ 2:13 PM

## 2022-08-21 DIAGNOSIS — F323 Major depressive disorder, single episode, severe with psychotic features: Secondary | ICD-10-CM | POA: Diagnosis not present

## 2022-08-21 DIAGNOSIS — F333 Major depressive disorder, recurrent, severe with psychotic symptoms: Secondary | ICD-10-CM | POA: Insufficient documentation

## 2022-08-21 LAB — SARS CORONAVIRUS 2 BY RT PCR: SARS Coronavirus 2 by RT PCR: NEGATIVE

## 2022-08-21 MED ORDER — QUETIAPINE FUMARATE 100 MG PO TABS: 100.0000 mg | ORAL_TABLET | Freq: Every day | ORAL | Status: DC

## 2022-08-21 NOTE — ED Notes (Signed)
Upon entering the room to give night meds this RN found the pt to be much less anxious than when she arrived on unit and her husband was very pleased and grateful.  The pt took her meds with no trouble, saw her husband to the door, brushed her teeth and laid down.

## 2022-08-21 NOTE — Progress Notes (Signed)
Pt was accepted to [facility]; Bed Assignment [facility] is where [facility] is located.  Pt meets inpatient criteria per Darrol Angel, NP   Attending Physician will be  Dr. Gery Pray    Report can be called [phone]  Pt can arrive: ASAP Bed IS READY  CSW is requesting the fax number to send the consent forms and then consent will need to be sent back to this CSW at [phone].  Care Team Notified: Vesta Mixer, NP, Nanetta Batty, RN, and Lorenza Chick, RN   Benjaman Kindler, MSW, [facility] 08/21/2022 4:28 PM

## 2022-08-21 NOTE — ED Notes (Signed)
Pt is resting in bed with eyes closed, respirations are even and non-labored

## 2022-08-21 NOTE — ED Notes (Signed)
Through the window the pt appears calmer and is interacting with husband.

## 2022-08-21 NOTE — ED Notes (Signed)
Pts sister at bedside visiting.

## 2022-08-21 NOTE — ED Notes (Signed)
Patient given another pair of socks.

## 2022-08-21 NOTE — Progress Notes (Signed)
[facility] Psych ED Progress Note  08/21/2022 1:39 PM Alyssa [name]  MRN:  [id number]   Subjective:   Patient seen in her room at [facility] ED for face-to-face reevaluation.  She continues to appear anxious, however is more talkative and willing to participate in assessment than yesterday.  She does tell me she slept much better last night and feels like she got around 6 to 7 hours of sleep.  She is alert and oriented x3.  She does continue to have delusions of thinking her husband is leaving her and they are getting a divorce.  She continues to think she is moving out of their house and does not live there although she does.  She denies current suicidal or homicidal ideations.  Denies any auditory or visual hallucinations.  Denies any feelings of paranoia like someone is trying to harm her.  She tells me she still has decreased appetite.  She has been compliant with her medications.  When I asked her why she stopped taking her medications when she leaves the hospital she shrugs her shoulders and states " I just don't think I need them when I get home."  Attempted to educate her on the importance of medication compliance outside of the hospital to prevent rehospitalization.  She appeared to be understanding of this concept. She did give me permission to contact her husband.   I spoke with her husband, Latavia Goga, at [phone].  He does confirm that he feels like she has improved however she is still having some delusions.  He assures me they are not getting divorced and he attempts to reassure her every day that he is not leaving her and they are not going to be separated.  He is not sure why she tells people they are separating or she has been the other house because that is never something they have talked about.  He mentions to me they have had a really hard time obtaining follow-up appointments and that is why she goes off her medications and ends up back at the hospital.  He is requesting we set up follow-up  appointments prior to her leaving the hospital.  I spoke to him that as of right now the plan is for her to still get inpatient psychiatric treatment, however if she continues to improve in the ED prior to obtaining inpatient bed she could be discharged.  I did speak to him about community resources such as behavioral health urgent care and their walk-in hours for OP psychiatry and med management.  He does not feel like she is well enough or close enough to baseline to return home.  He tells me she is still very paranoid about her medications and he had to help the nurse last night convince her to take her nightly medication, she was attempting to refuse it.  He would still like for her to get inpatient psychiatric treatment.  Principal Problem: Major depressive disorder with psychotic features ([place]) Diagnosis:  Principal Problem:   Major depressive disorder with psychotic features ([place]) Active Problems:   Anxiety and depression   Paranoid delusion [facility])   ED Assessment Time Calculation: Start Time: 1400 Stop Time: 1420 Total Time in Minutes (Assessment Completion): [place] Scale:  [facility] ED from 08/17/2022 in [place] Most recent reading at 08/17/2022  8:36 PM ED from 08/17/2022 in [facility] Most recent reading at 08/17/2022  5:14 PM ED from 06/07/2022 in [facility] Most recent reading at 06/07/2022  2:52 PM  C-SSRS RISK CATEGORY No Risk No Risk No Risk       Past Medical History:  Past Medical History:  Diagnosis Date   Anxiety and depression    Arthritis    Cervicogenic headache    Claustrophobia    Glaucoma    Hypertension    Lumbar radiculopathy    Postsurgical hypothyroidism    Spinal stenosis    Thyroid cancer ([facility])    s/p thyroidectomy    Past Surgical History:  Procedure Laterality Date   FOOT SURGERY     LAMINOTOMY  04/03/2021   LEFT L3/4 LAMINOTOMY BILATERAL  L3/4 MEDIAL FACETECTOMY   LUMBAR LAMINECTOMY  03/03/2018   THYROIDECTOMY  12/2004   Family History:  Family History  Problem Relation Age of Onset   Hypertension Sister    Thyroid cancer Brother    Skin cancer Brother    Diabetes Paternal Grandmother    Colon cancer Neg Hx    Breast cancer Neg Hx    Pancreatic cancer Neg Hx    Stomach cancer Neg Hx    Liver cancer Neg Hx    Esophageal cancer Neg Hx    Social History:  Social History   Substance and Sexual Activity  Alcohol Use Yes   Comment: Rare     Social History   Substance and Sexual Activity  Drug Use Never    Social History   Socioeconomic History   Marital status: Married    Spouse name: Not on file   Number of children: 0   Years of education: Not on file   Highest education level: Master's degree (e.g., MA, MS, MEng, MEd, MSW, MBA)  Occupational History   Occupation: retired, Pharmacologist  Tobacco Use   Smoking status: Never   Smokeless tobacco: Never  Vaping Use   Vaping Use: Never used  Substance and Sexual Activity   Alcohol use: Yes    Comment: Rare   Drug use: Never   Sexual activity: Not Currently  Other Topics Concern   Not on file  Social History Narrative   Moved from [place] June 2022   Lives w/ husband   Moved to stay close to her sister (Mrs Terance Hart)   Social Determinants of Health   Financial Resource Strain: Not on file  Food Insecurity: Not on file  Transportation Needs: Not on file  Physical Activity: Not on file  Stress: Not on file  Social Connections: Not on file    Sleep: Fair  Appetite:  Fair  Current Medications: Current Facility-Administered Medications  Medication Dose Route Frequency Provider Last Rate Last Admin   acetaminophen (TYLENOL) tablet 1,000 mg  1,000 mg Oral Q6H PRN Pati Gallo S, PA   1,000 mg at 08/18/22 2303   buPROPion (WELLBUTRIN XL) 24 hr tablet 150 mg  150 mg Oral q morning Merlyn Lot E, NP   150 mg at 08/21/22 0940   gabapentin  (NEURONTIN) capsule 300 mg  300 mg Oral TID PRN Mallie Darting, NP       levothyroxine (SYNTHROID) tablet 112 mcg  112 mcg Oral Q0600 Merlyn Lot E, NP   112 mcg at 08/21/22 0537   ondansetron (ZOFRAN) tablet 4 mg  4 mg Oral Q6H PRN Fondaw, Wylder S, PA       polyethylene glycol (MIRALAX / GLYCOLAX) packet 17 g  17 g Oral Daily PRN Pati Gallo S, PA       QUEtiapine (  SEROQUEL) tablet 100 mg  100 mg Oral QHS Vesta Mixer, NP       traZODone (DESYREL) tablet 50 mg  50 mg Oral QHS PRN Merlyn Lot E, NP   50 mg at 08/20/22 2135   Current Outpatient Medications  Medication Sig Dispense Refill   buPROPion (WELLBUTRIN XL) 150 MG 24 hr tablet Take 150 mg by mouth every morning.     clonazePAM (KLONOPIN) 0.5 MG tablet Take 0.5 mg by mouth 2 (two) times daily as needed for anxiety.     SYNTHROID 112 MCG tablet Take by mouth.     traZODone (DESYREL) 50 MG tablet Take 50 mg by mouth at bedtime as needed for sleep.     OLANZapine zydis (ZYPREXA) 5 MG disintegrating tablet Take 0.5 tablets (2.5 mg total) by mouth 2 (two) times daily. (Patient not taking: Reported on 08/18/2022)      Lab Results:  Results for orders placed or performed during the hospital encounter of 08/17/22 (from the past 48 hour(s))  Urinalysis, Complete w Microscopic Urine, Clean Catch     Status: Abnormal   Collection Time: 08/20/22 11:30 AM  Result Value Ref Range   Color, Urine AMBER (A) YELLOW    Comment: BIOCHEMICALS MAY BE AFFECTED BY COLOR   APPearance HAZY (A) CLEAR   Specific Gravity, Urine 1.027 1.005 - 1.030   pH 5.0 5.0 - 8.0   Glucose, UA NEGATIVE NEGATIVE mg/dL   Hgb urine dipstick NEGATIVE NEGATIVE   Bilirubin Urine NEGATIVE NEGATIVE   Ketones, ur 80 (A) NEGATIVE mg/dL   Protein, ur NEGATIVE NEGATIVE mg/dL   Nitrite NEGATIVE NEGATIVE   Leukocytes,Ua SMALL (A) NEGATIVE   RBC / HPF 0-5 0 - 5 RBC/hpf   WBC, UA 11-20 0 - 5 WBC/hpf   Bacteria, UA RARE (A) NONE SEEN   Squamous Epithelial / LPF 0-5 0 - 5    Mucus PRESENT    Non Squamous Epithelial 0-5 (A) NONE SEEN    Comment: Performed at [facility], [address], [place], [place] [address]    Blood Alcohol level:  Lab Results  Component Value Date   [facility] <10 08/17/2022   ETH <10 06/04/2022   Psychiatric Specialty Exam:  Presentation  General Appearance: Appropriate for Environment  Eye Contact:Fair  Speech:Clear and Coherent  Speech Volume:Normal  Handedness:Right   Mood and Affect  Mood:Anxious  Affect:Congruent   Thought Process  Thought Processes:Coherent  Descriptions of Associations:Intact  Orientation:Full (Time, Place and Person)  Thought Content:Logical  History of Schizophrenia/Schizoaffective disorder:No  Duration of Psychotic Symptoms:N/A  Hallucinations:Hallucinations: None  Ideas of Reference:Delusions  Suicidal Thoughts:Suicidal Thoughts: No  Homicidal Thoughts:Homicidal Thoughts: No   Sensorium  Memory:Immediate Fair  Judgment:Fair  Insight:Fair   Executive Functions  Concentration:Fair  Attention Span:Fair  [place] of Knowledge:Good  Language:Good   Psychomotor Activity  Psychomotor Activity:Psychomotor Activity: Normal   Assets  Assets:Communication Skills; Desire for Improvement; Resilience; Social Support   Sleep  Sleep:Sleep: Good    Physical Exam: Physical Exam Neurological:     Mental Status: She is alert and oriented to person, place, and time.    Review of Systems  Psychiatric/Behavioral:  The patient is nervous/anxious.        Delusions  All other systems reviewed and are negative.  Blood pressure 134/81, pulse 98, temperature 97.9 F (36.6 C), temperature source Oral, resp. rate 20, SpO2 99 %. There is no height or weight on file to calculate BMI.   Medical Decision  Making: Patient case reviewed and discussed with Dr. Leverne Humbles.  Will continue recommendation for inpatient psychiatric treatment.  EDP, RN, and LCSW  notified.  Problem 1: delusions - Increased Seroquel to 100 mg Qhs  Vesta Mixer, NP 08/21/2022, 1:39 PM

## 2022-08-21 NOTE — ED Notes (Signed)
[facility] called and made aware of patient's covid status.  They are still accepting patient. MD made aware for EMTALA completion.

## 2022-08-21 NOTE — ED Notes (Signed)
Walked with patient to [employer], patient got in vehicle without incident, paperwork given to driver.

## 2022-08-21 NOTE — ED Provider Notes (Signed)
Emergency Medicine Observation Re-evaluation Note  Alyssa [name] is a 71 y.o. female, seen on rounds today.  Pt initially presented to the ED for complaints of Anxiety /Depression  Currently, the patient is resting comfortably.  Physical Exam  BP 134/81 (BP Location: Right Arm)   Pulse 98   Temp 97.9 F (36.6 C) (Oral)   Resp 20   SpO2 99%  Physical Exam General: NAD  ED Course / MDM  EKG:EKG Interpretation  Date/Time:  Friday August 17 2022 21:11:01 EDT Ventricular Rate:  72 PR Interval:  124 QRS Duration: 92 QT Interval:  394 QTC Calculation: 431 R Axis:   77 Text Interpretation: Normal sinus rhythm Normal ECG When compared with ECG of 26-Nov-2021 08:41, PREVIOUS ECG IS PRESENT No significant change since last tracing Confirmed by Octaviano Glow [id number]) on 08/18/2022 10:48:02 AM  I have reviewed the labs performed to date as well as medications administered while in observation.  Recent changes in the last 24 hours include no acute events reported.  Plan  Current plan is for placement.    Valarie Merino, MD 08/21/22 [phone]

## 2022-08-21 NOTE — ED Notes (Signed)
Patient and husband state that patient's husband has taken her belongings home, so patient has no belongings with her.

## 2022-09-07 ENCOUNTER — Encounter (HOSPITAL_COMMUNITY): Payer: Self-pay | Admitting: Psychiatry

## 2022-09-07 ENCOUNTER — Ambulatory Visit (INDEPENDENT_AMBULATORY_CARE_PROVIDER_SITE_OTHER): Payer: Medicare Other | Admitting: Psychiatry

## 2022-09-07 DIAGNOSIS — F410 Panic disorder [episodic paroxysmal anxiety] without agoraphobia: Secondary | ICD-10-CM

## 2022-09-07 DIAGNOSIS — F411 Generalized anxiety disorder: Secondary | ICD-10-CM

## 2022-09-07 DIAGNOSIS — F331 Major depressive disorder, recurrent, moderate: Secondary | ICD-10-CM

## 2022-09-07 MED ORDER — QUETIAPINE FUMARATE 100 MG PO TABS: 100.0000 mg | ORAL_TABLET | Freq: Every day | ORAL | 0 refills | Status: DC

## 2022-09-07 MED ORDER — ESCITALOPRAM OXALATE 10 MG PO TABS: 10.0000 mg | ORAL_TABLET | Freq: Every day | ORAL | 0 refills | Status: DC

## 2022-09-07 MED ORDER — LORAZEPAM 1 MG PO TABS: 1.0000 mg | ORAL_TABLET | Freq: Every day | ORAL | 0 refills | Status: DC | PRN
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
## 2022-09-19 IMAGING — CT CT ABD-PELV W/ CM
2 of 5 series · 16 of 46 positions shown, 18 images · IV contrast (Omnipaque)
Comparison: None.

CLINICAL DATA: Abdominal pain, vomiting, constipation

EXAM:
CT ABDOMEN AND PELVIS WITH CONTRAST
TECHNIQUE: Multidetector CT imaging of the abdomen and pelvis was performed
using the standard protocol following bolus administration of
intravenous contrast.
CONTRAST:  100mL OMNIPAQUE IOHEXOL 300 MG/ML  SOLN

[Series 2: axial st · axial · 0.95mm/px · z∈[-559,-94]mm · 13 of 105 slices shown, 15 images]
[im 6/105  soft-tissue]
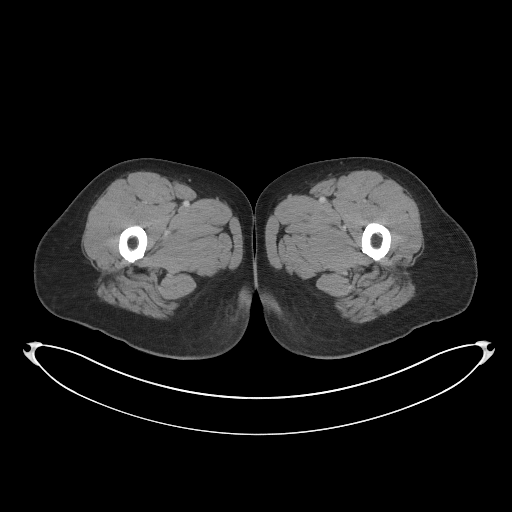
[im 6/105  bone]
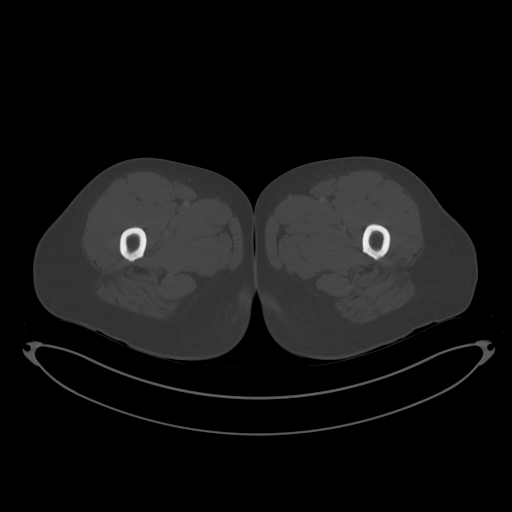
[im 17/105  soft-tissue]
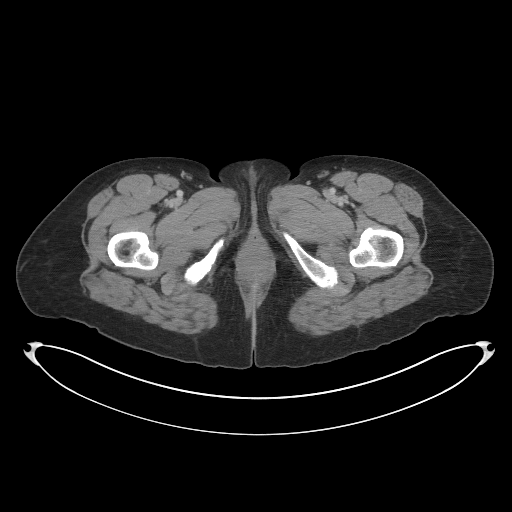
[im 22/105  soft-tissue]
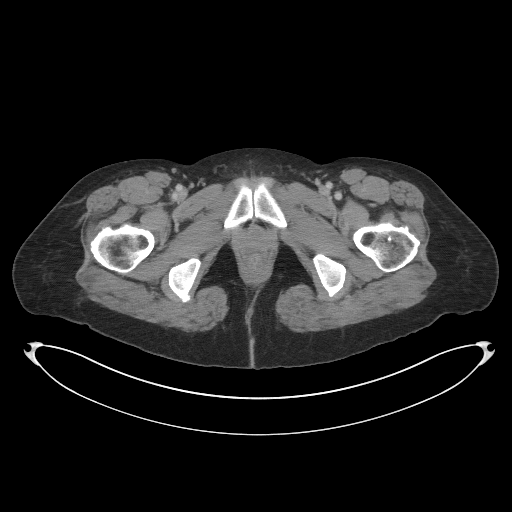
[im 28/105  soft-tissue]
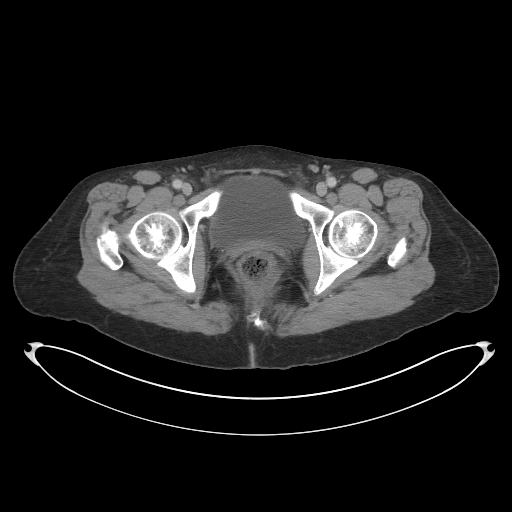
[im 39/105  soft-tissue]
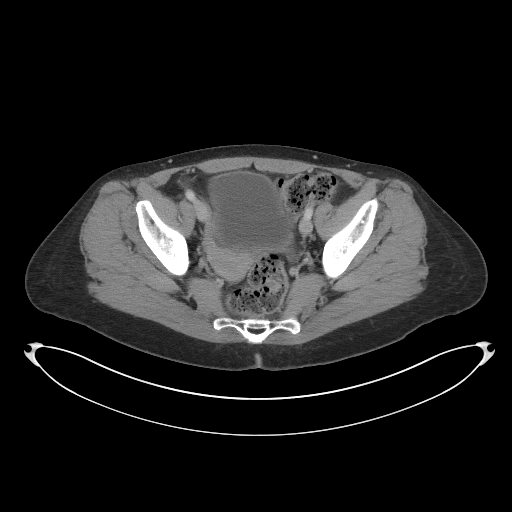
[im 44/105  soft-tissue]
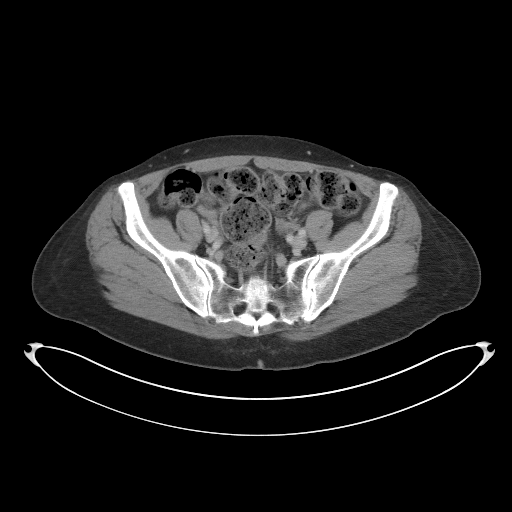
[im 55/105  soft-tissue]
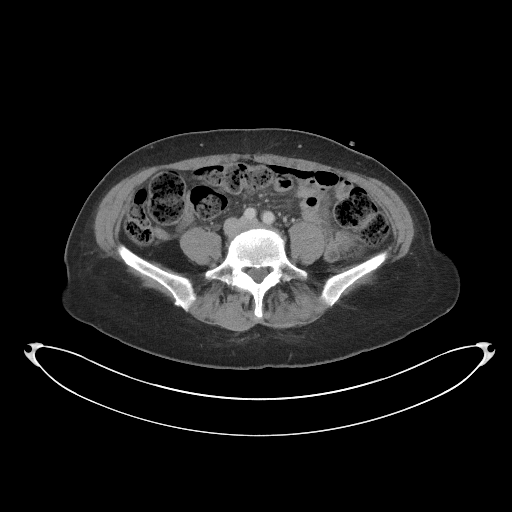
[im 61/105  soft-tissue]
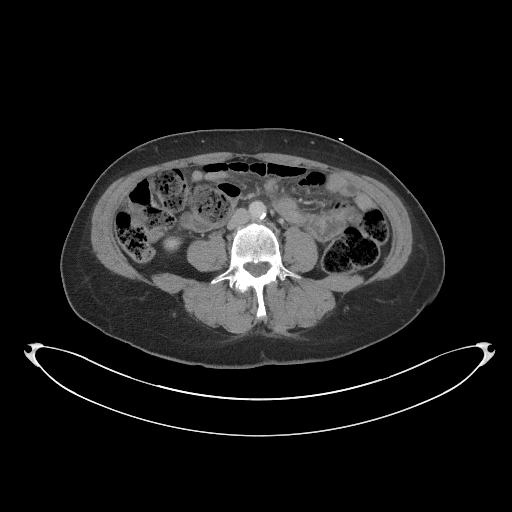
[im 66/105  soft-tissue]
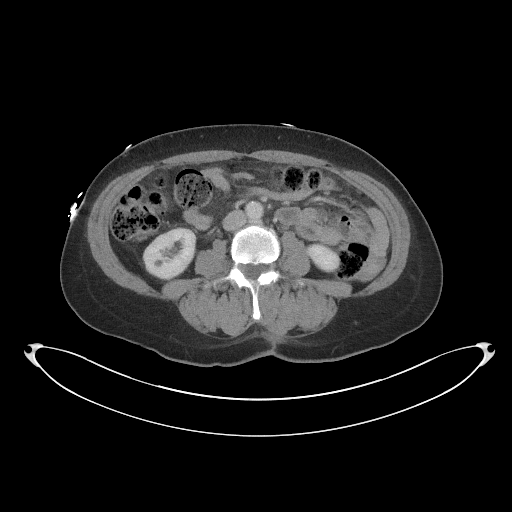
[im 66/105  bone]
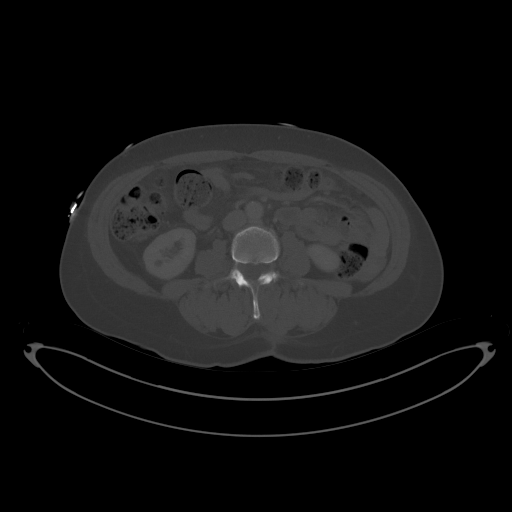
[im 77/105  soft-tissue]
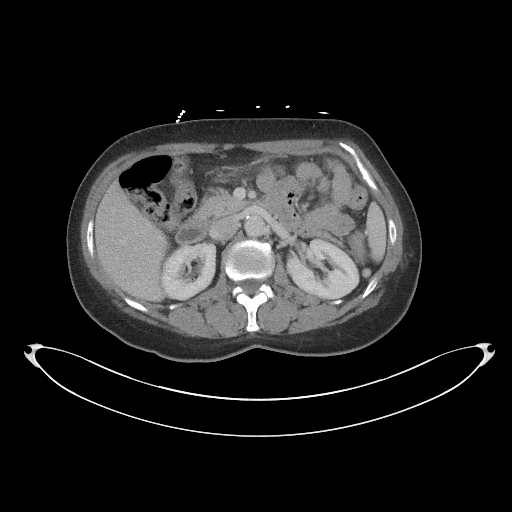
[im 83/105  soft-tissue]
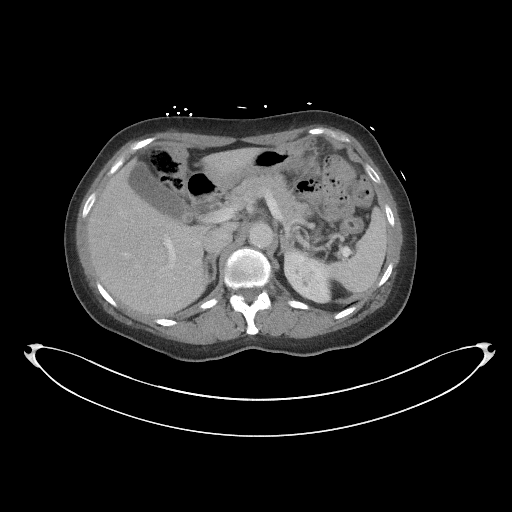
[im 88/105  soft-tissue]
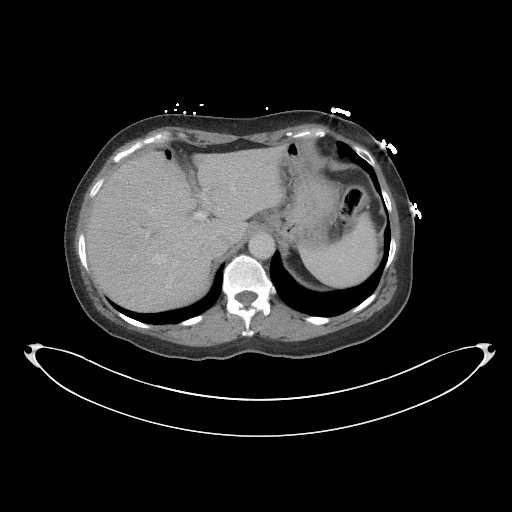
[im 99/105  soft-tissue]
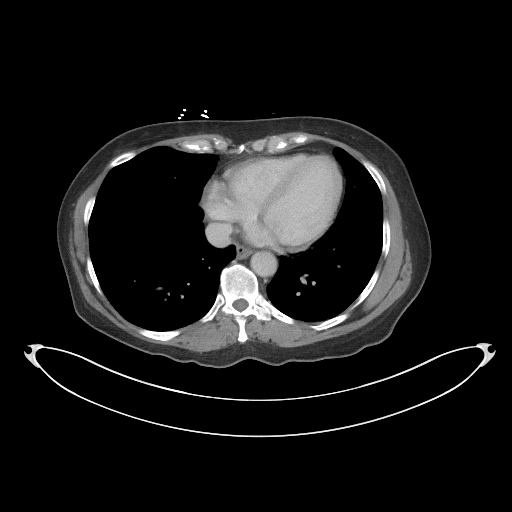

[Series 6: coronal st · coronal · 0.96mm/px · 3 of 89 slices shown]
[im 30/89  soft-tissue]
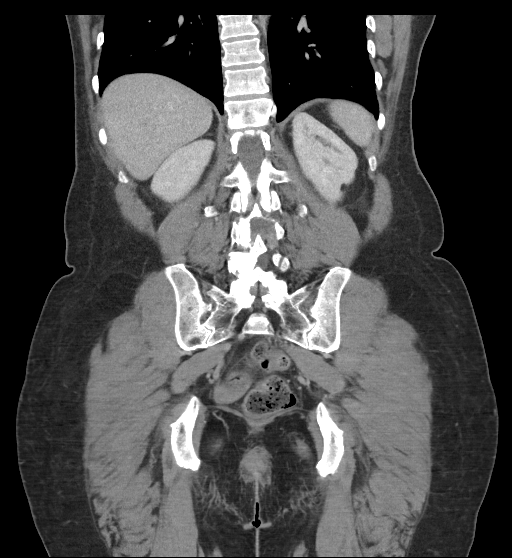
[im 40/89  soft-tissue]
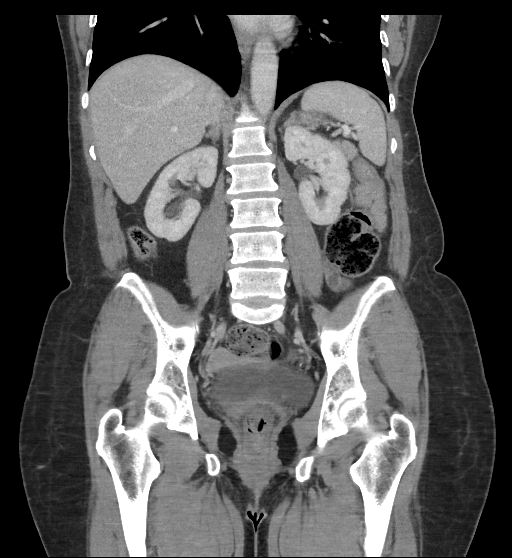
[im 49/89  soft-tissue]
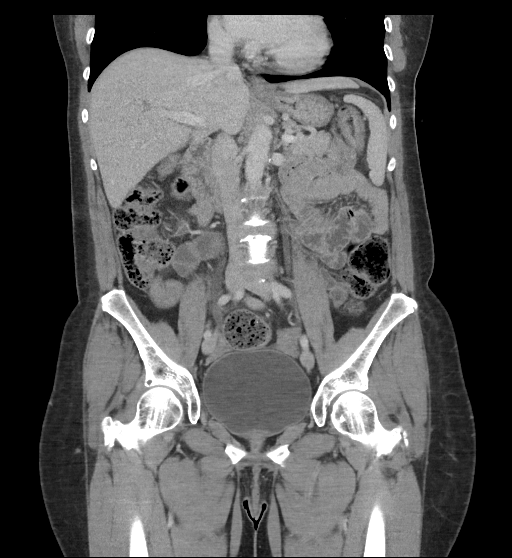

[16 of 46 positions shown; findings below may reference images not displayed]

FINDINGS: Lower chest: No acute abnormality.

Hepatobiliary: Scattered bilobar hypodense subcentimeter hepatic
lesions too small to accurately characterize but statistically
likely to represent a benign etiology such as cysts/hemangiomas.
Gallbladder is unremarkable. No biliary ductal dilation.

Pancreas: Within normal limits.

Spleen: Within normal limits.

Adrenals/Urinary Tract: Bilateral adrenal glands are unremarkable.

No hydronephrosis. Nonobstructive left lower pole renal stone
measuring 4 mm. Bilateral hypodense renal lesions technically too
small to accurately characterize but statistically likely represent
cysts.

Urinary bladder is grossly unremarkable.

Stomach/Bowel: Mild diffuse gastric wall thickening of an
incompletely distended stomach. No pathologic dilation of small
bowel. Moderate volume of formed stool throughout the colon. Short
segment of apparent colonic wall thickening involving the descending
colon on image 35/6 and axial image [DATE], at least in part
accentuated by under distension.

Vascular/Lymphatic: Aortic atherosclerosis without aneurysmal
dilation. No pathologically enlarged abdominal or pelvic lymph
nodes.

Reproductive: Uterus and bilateral adnexa are unremarkable.

Other: No abdominopelvic ascites.

Musculoskeletal: High densities carotic stellate lesions in the
femoral heads and proximal left femur at the greater trochanter,
favor bone islands. Multilevel degenerative changes spine. No acute
osseous abnormality.
IMPRESSION: 1. Mild diffuse gastric wall thickening of an incompletely distended
stomach, which may represent gastritis.
2. Moderate volume of formed stool throughout the colon, suggestive
of constipation.
3. Short segment of apparent colonic wall thickening involving the
proximal descending colon, at least in part accentuated by under
distension. Consider correlation with colonoscopy, if not previously
performed.
4. Moderate volume of formed stool throughout the colon.
5. Nonobstructive left lower pole renal stone measuring 4 mm.
6.  Aortic Atherosclerosis ([id number]).

## 2022-09-20 ENCOUNTER — Ambulatory Visit (HOSPITAL_COMMUNITY): Payer: Medicare Other | Admitting: Psychiatry

## 2022-09-20 ENCOUNTER — Encounter (HOSPITAL_COMMUNITY): Payer: Self-pay

## 2022-09-30 IMAGING — CT CT ABD-PELV W/ CM
2 of 5 series · 17 of 46 positions shown, 19 images · IV contrast (Omnipaque)
Comparison: 06/16/2021

CLINICAL DATA: Right lower quadrant pain suspected appendicitis,
constipation X 1 month

EXAM:
CT ABDOMEN AND PELVIS WITH CONTRAST
TECHNIQUE: Multidetector CT imaging of the abdomen and pelvis was performed
using the standard protocol following bolus administration of
intravenous contrast.
CONTRAST:  75mL OMNIPAQUE IOHEXOL 300 MG/ML  SOLN

[Series 2: axial st · axial · 0.94mm/px · z∈[-458,-43]mm · 14 of 93 slices shown, 16 images]
[im 5/93  soft-tissue]
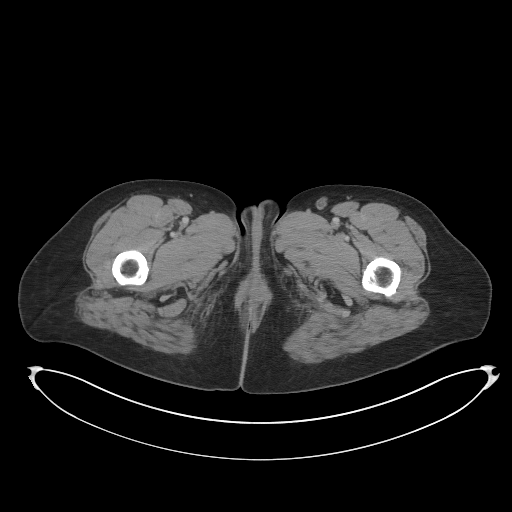
[im 5/93  bone]
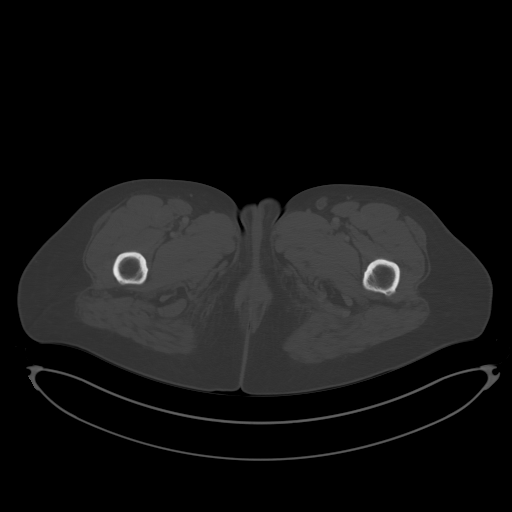
[im 10/93  soft-tissue]
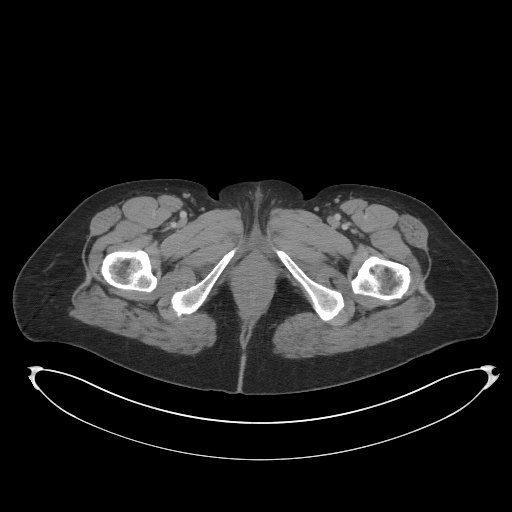
[im 20/93  soft-tissue]
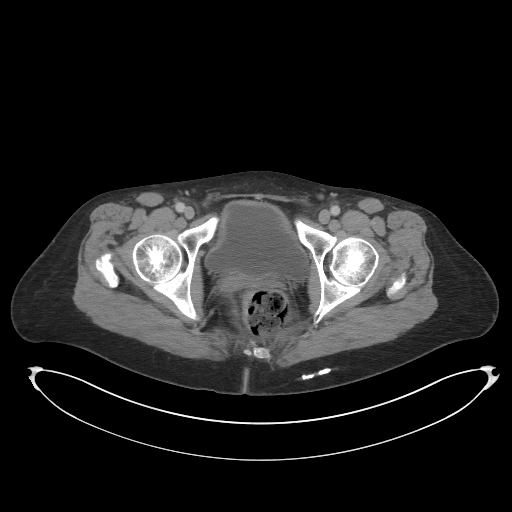
[im 25/93  soft-tissue]
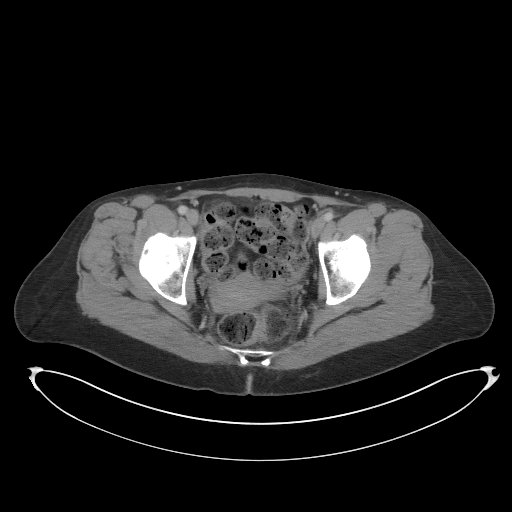
[im 30/93  soft-tissue]
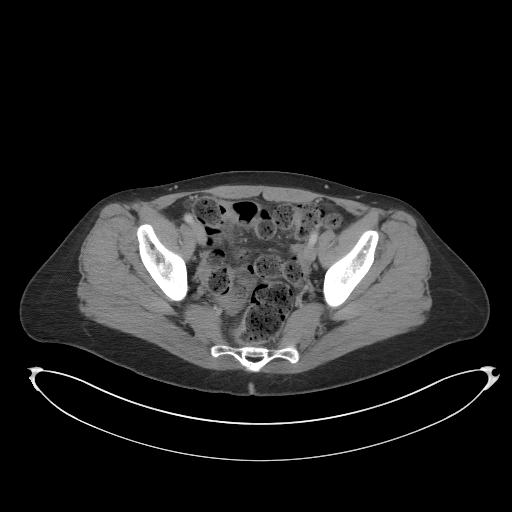
[im 39/93  soft-tissue]
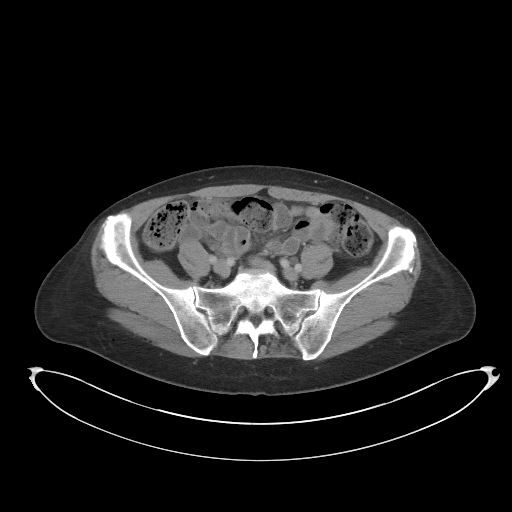
[im 44/93  soft-tissue]
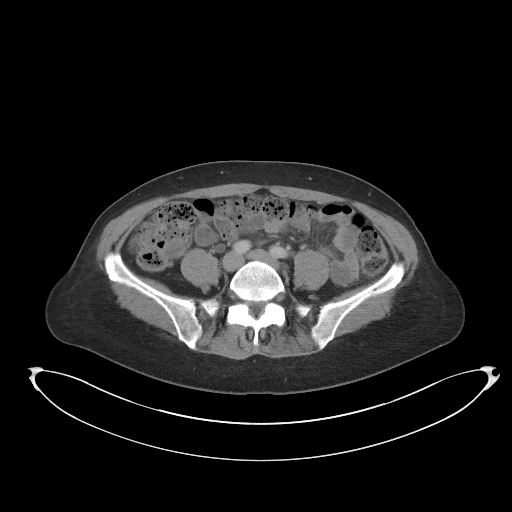
[im 49/93  soft-tissue]
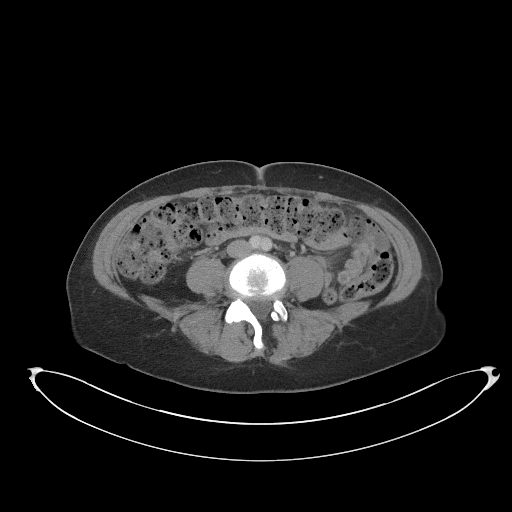
[im 54/93  soft-tissue]
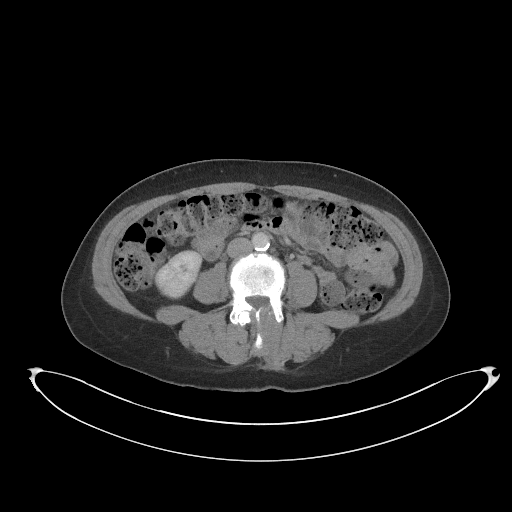
[im 54/93  bone]
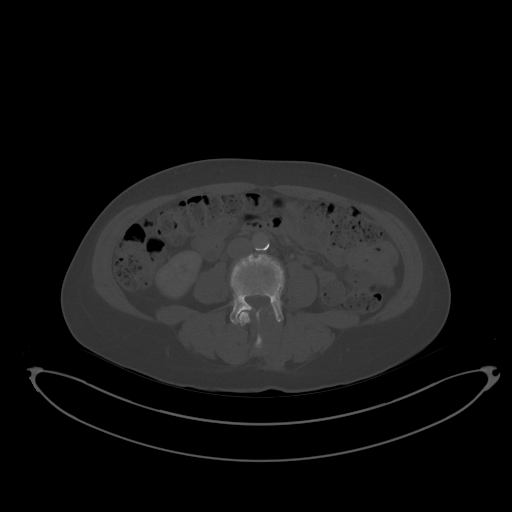
[im 63/93  soft-tissue]
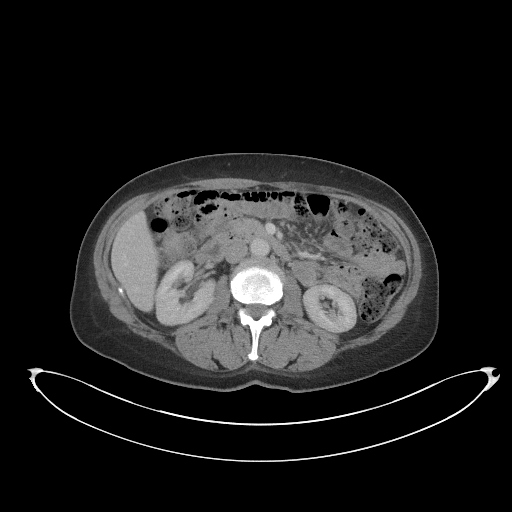
[im 68/93  soft-tissue]
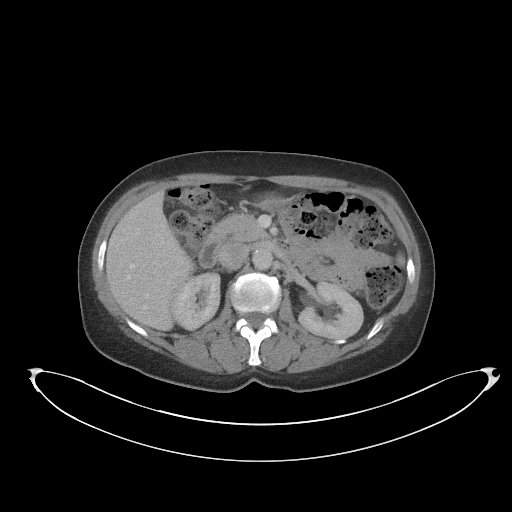
[im 73/93  soft-tissue]
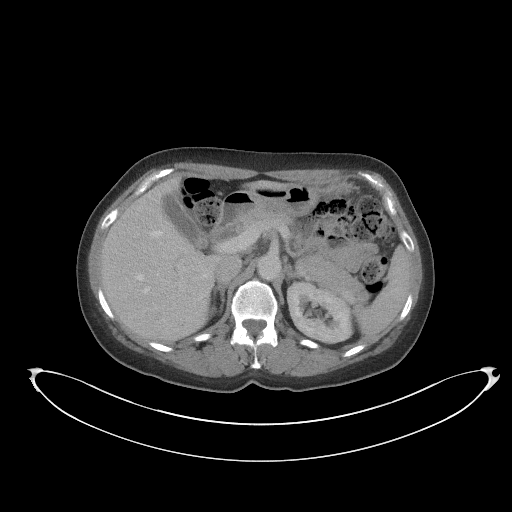
[im 83/93  soft-tissue]
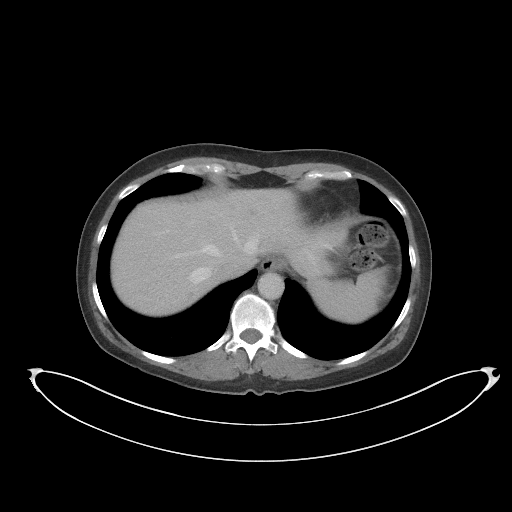
[im 88/93  soft-tissue]
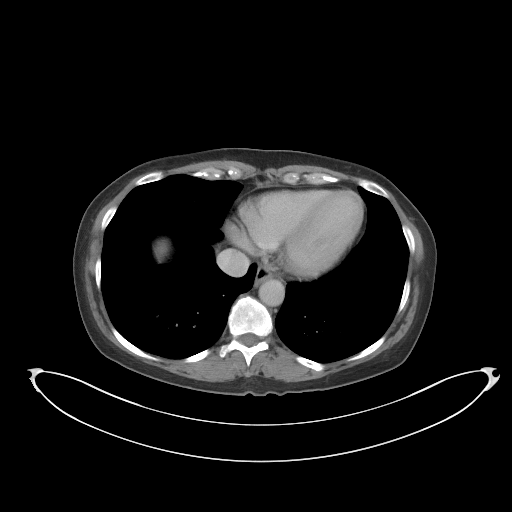

[Series 5: coronal st · coronal · 0.81mm/px · 3 of 80 slices shown]
[im 27/80  soft-tissue]
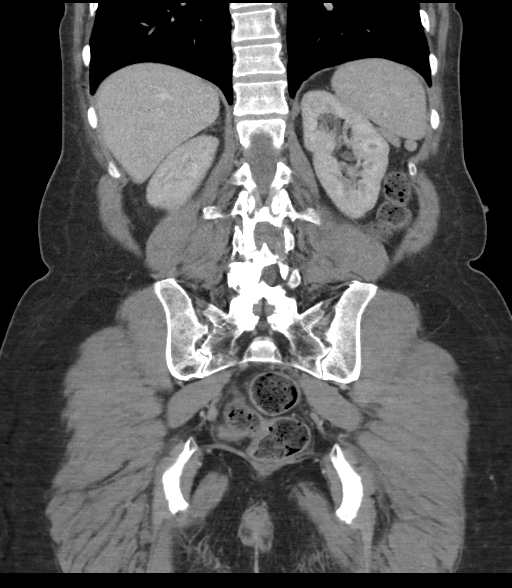
[im 36/80  soft-tissue]
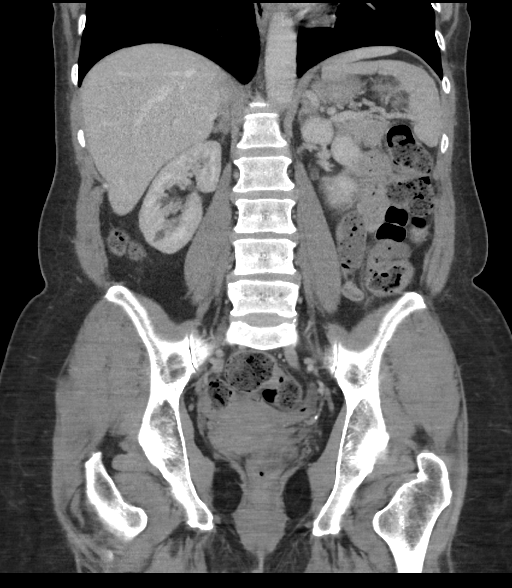
[im 44/80  soft-tissue]
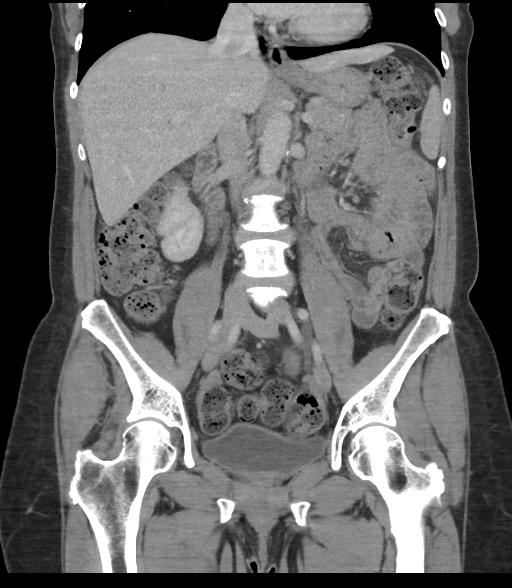

[17 of 46 positions shown; findings below may reference images not displayed]

FINDINGS: Lower chest: No pleural or pericardial effusion.

Hepatobiliary: Stable probable hepatic cysts. No calcified stones in
the nondistended gallbladder.

Pancreas: Unremarkable. No pancreatic ductal dilatation or
surrounding inflammatory changes.

Spleen: Normal in size without focal abnormality. Small accessory
splenule

Adrenals/Urinary Tract: Adrenal glands unremarkable. 3 mm
calcification, lower pole left kidney. No hydronephrosis.

Stomach/Bowel: Stomach nondistended, unremarkable. Small bowel
decompressed. Appendix not identified. Moderate colonic fecal
material throughout. No focal inflammatory changes.

Vascular/Lymphatic: Mild calcified aortic plaque without aneurysm,
dissection, or stenosis. No abdominal or pelvic adenopathy.

Reproductive: Uterus and bilateral adnexa are unremarkable.

Other: No ascites.  No free air.

Musculoskeletal: Post laminotomy L2-3. no fracture or worrisome bone
lesion.
IMPRESSION: 1. No acute findings.
2. Moderate colonic fecal material without dilatation
3. Left nephrolithiasis without hydronephrosis
4. Aortic Atherosclerosis ([id number]-170.0).

## 2022-11-05 LAB — EXTERNAL GENERIC LAB PROCEDURE

## 2022-11-05 LAB — COLOGUARD

## 2022-11-14 LAB — EXTERNAL GENERIC LAB PROCEDURE

## 2022-11-14 LAB — COLOGUARD

## 2022-12-12 ENCOUNTER — Telehealth: Payer: Self-pay

## 2022-12-12 ENCOUNTER — Ambulatory Visit (INDEPENDENT_AMBULATORY_CARE_PROVIDER_SITE_OTHER): Payer: Medicare Other | Admitting: Family Medicine

## 2022-12-12 VITALS — BP 151/83 | HR 63 | Wt 173.0 lb

## 2022-12-12 DIAGNOSIS — R6882 Decreased libido: Secondary | ICD-10-CM

## 2022-12-12 DIAGNOSIS — N898 Other specified noninflammatory disorders of vagina: Secondary | ICD-10-CM

## 2022-12-12 DIAGNOSIS — R350 Frequency of micturition: Secondary | ICD-10-CM | POA: Diagnosis not present

## 2022-12-12 LAB — POCT URINALYSIS DIPSTICK
Bilirubin, UA: NEGATIVE
Blood, UA: NEGATIVE
Glucose, UA: NEGATIVE
Ketones, UA: NEGATIVE
Leukocytes, UA: NEGATIVE
Nitrite, UA: NEGATIVE
Protein, UA: NEGATIVE
Spec Grav, UA: 1.01 (ref 1.010–1.025)
Urobilinogen, UA: 0.2 E.U./dL
pH, UA: 7 (ref 5.0–8.0)

## 2022-12-12 MED ORDER — ESTRADIOL 10 MCG VA TABS: 1.0000 | ORAL_TABLET | VAGINAL | 12 refills | Status: DC

## 2022-12-12 NOTE — Progress Notes (Signed)
Patient here to discuss HRT  CC: Urinary frequency and Burning.  Pt states she was prescribed Testerone gel in the past and would like to discuss.

## 2022-12-12 NOTE — Assessment & Plan Note (Signed)
Refilled vagifem. Continue progesterone for uterine protection.

## 2022-12-12 NOTE — Telephone Encounter (Signed)
TC to [facility] for prescription    TP Testosterone 2 mg/g gel and I applied 2 clicks .5gm every morning W/ 1 refill.

## 2022-12-12 NOTE — Progress Notes (Signed)
    Subjective:    Patient ID: Alyssa [name] is a 72 y.o. female presenting with No chief complaint on file.  on 12/12/2022  HPI: Patient is here for refill of hormones. She has previously been on Vagifem and progesterone for vaginal dryness and takes testosterone for her sex drive. This has been compounded at [facility]. Also reports some vaginal dryness.  Review of Systems  Constitutional:  Negative for chills and fever.  Respiratory:  Negative for shortness of breath.   Cardiovascular:  Negative for chest pain.  Gastrointestinal:  Negative for abdominal pain, nausea and vomiting.  Genitourinary:  Negative for dysuria.  Skin:  Negative for rash.      Objective:    BP (!) 151/83   Pulse 63   Wt 173 lb (78.5 kg)   BMI 26.30 kg/m  Physical Exam Exam conducted with a chaperone present.  Constitutional:      General: She is not in acute distress.    Appearance: She is well-developed.  HENT:     Head: Normocephalic and atraumatic.  Eyes:     General: No scleral icterus. Cardiovascular:     Rate and Rhythm: Normal rate.  Pulmonary:     Effort: Pulmonary effort is normal.  Abdominal:     Palpations: Abdomen is soft.  Musculoskeletal:     Cervical back: Neck supple.  Skin:    General: Skin is warm and dry.  Neurological:     Mental Status: She is alert and oriented to person, place, and time.    Urinalysis Negative today      Assessment & Plan:   Problem List Items Addressed This Visit       Unprioritized   Vaginal dryness - Primary    Refilled vagifem. Continue progesterone for uterine protection.      Relevant Medications   Estradiol 10 MCG TABS vaginal tablet (Start on 12/13/2022)   Decreased libido    Testosterone TP '2mg'$ /gm gel. 0.5 mg daily - to be called to [facility]       Other Visit Diagnoses     Urine frequency       Relevant Orders   POCT Urinalysis Dipstick (Completed)        Return in about 1 year (around 12/13/2023) for  may be seen by [facility] or JS or SA at [facility].  Donnamae Jude, MD 12/12/2022 11:16 AM

## 2022-12-12 NOTE — Assessment & Plan Note (Signed)
Testosterone TP '2mg'$ /gm gel. 0.5 mg daily - to be called to [facility]

## 2022-12-23 IMAGING — US US THYROID
1 series · 14 of 24 positions shown · non-contrast
Comparison: None.

CLINICAL DATA: Prior total thyroidectomy

EXAM:
THYROID ULTRASOUND
TECHNIQUE: Ultrasound examination of the thyroid gland and adjacent soft
tissues was performed.

[Series 1: us thyroid · 24 acquisitions, 14 frames shown]
[im 1/24]
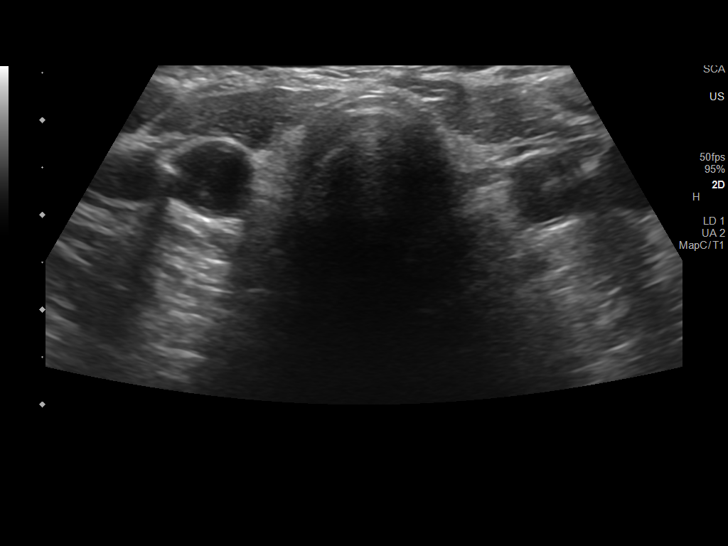
[im 3/24]
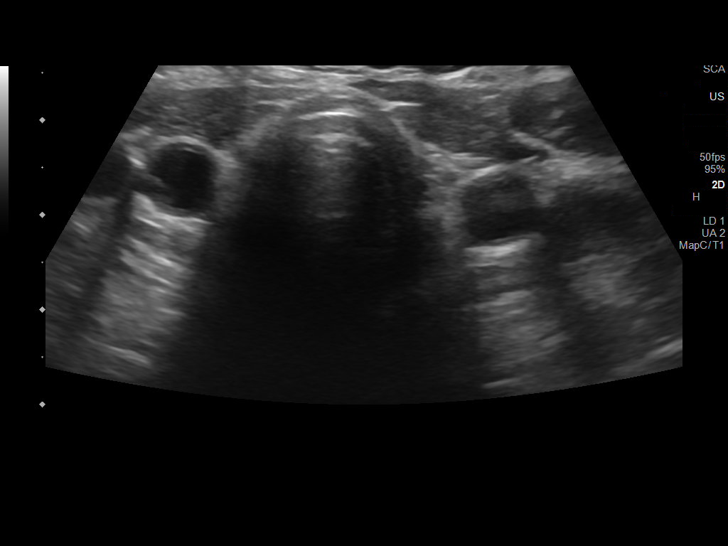
[im 5/24]
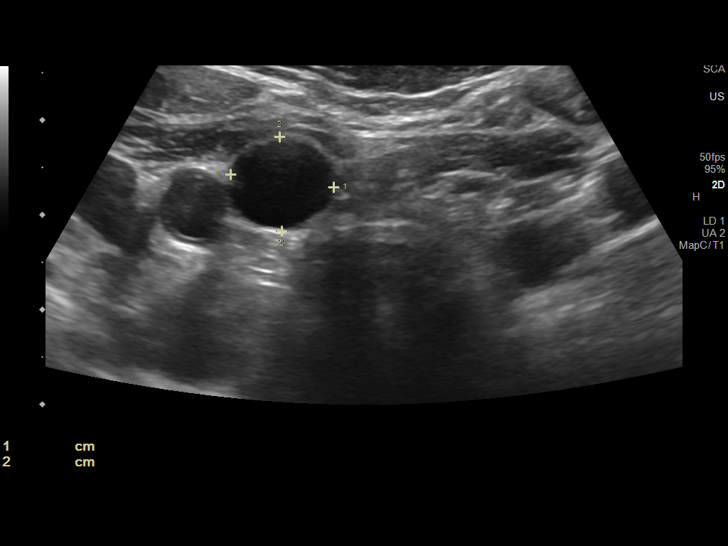
[im 7/24]
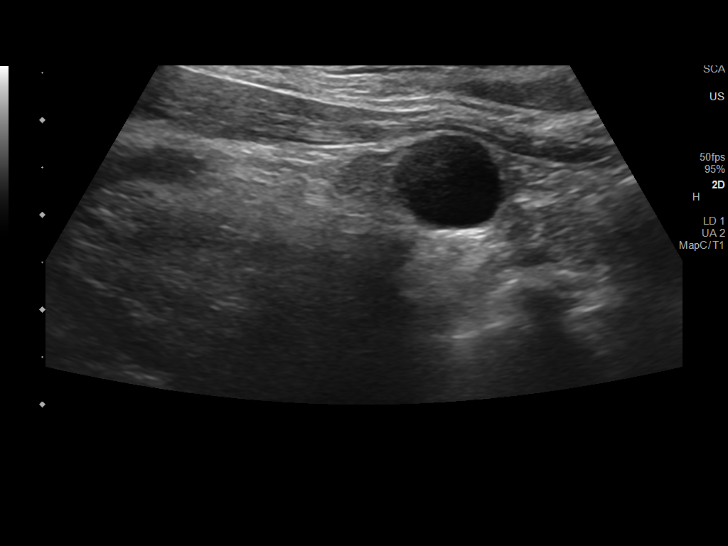
[im 8/24]
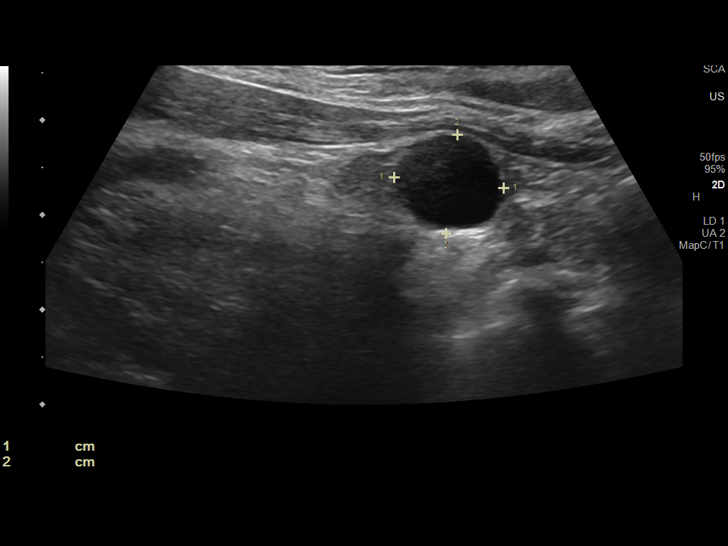
[im 10/24]
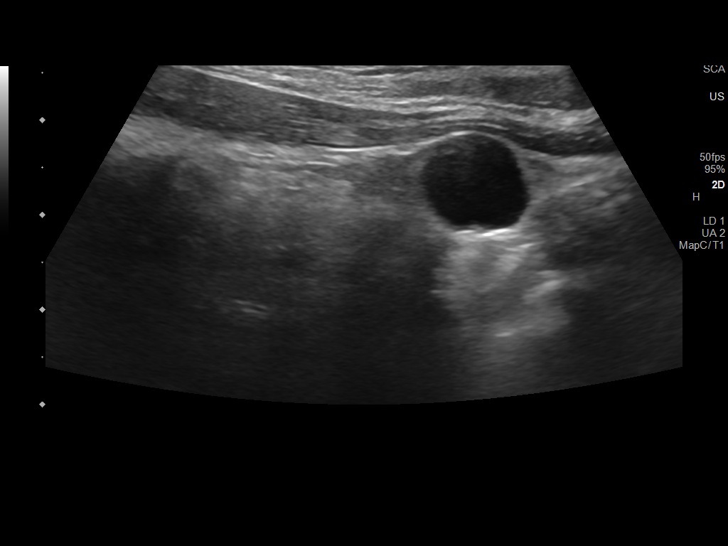
[im 12/24]
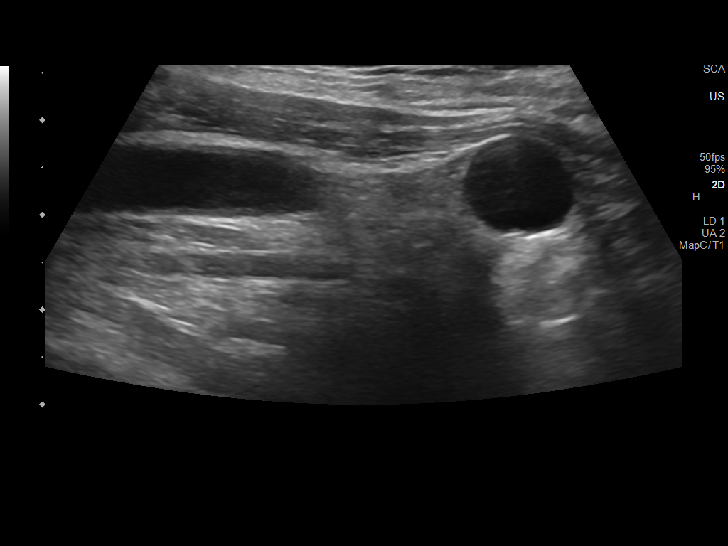
[im 13/24]
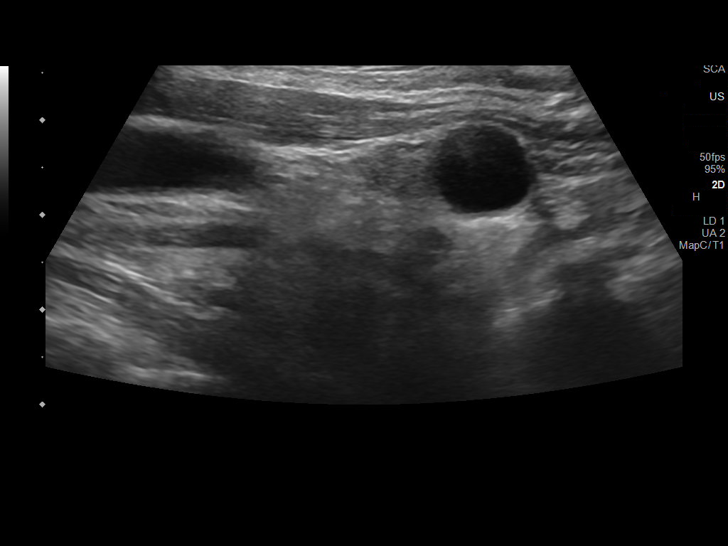
[im 15/24]
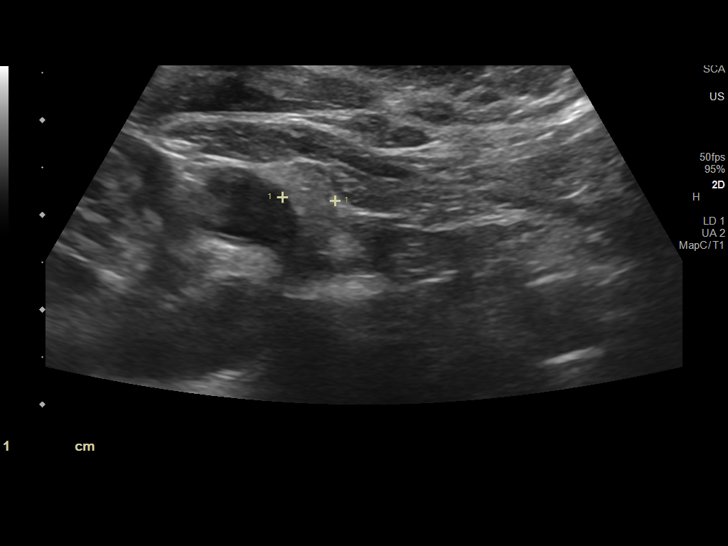
[im 17/24]
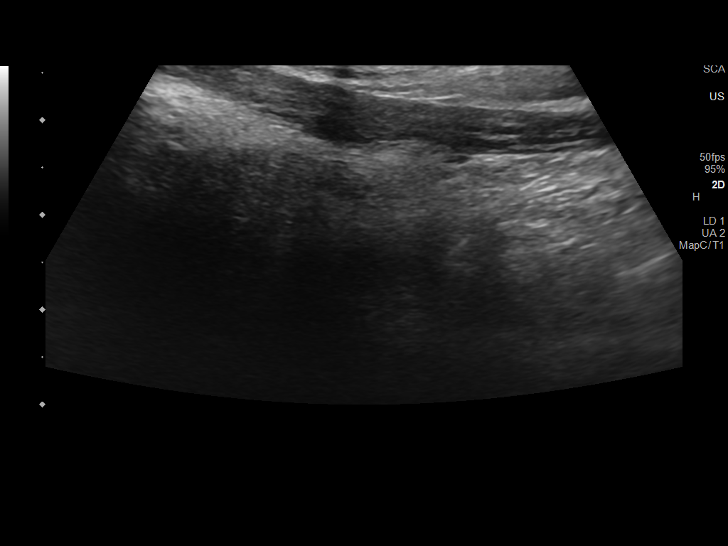
[im 19/24]
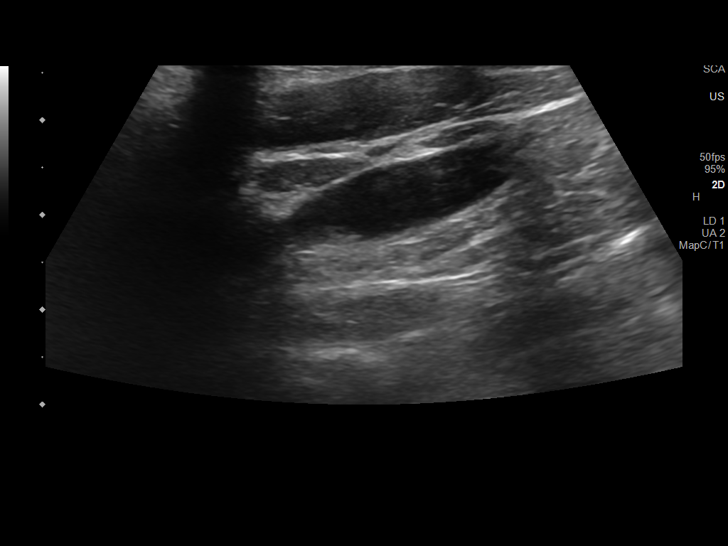
[im 20/24]
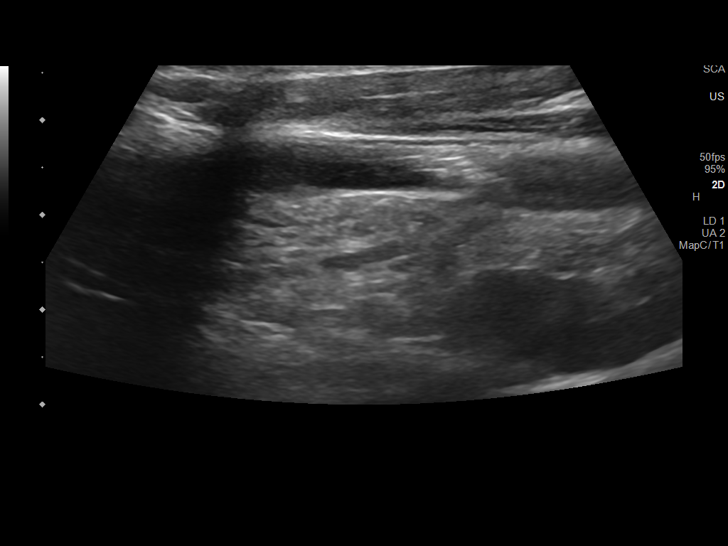
[im 22/24]
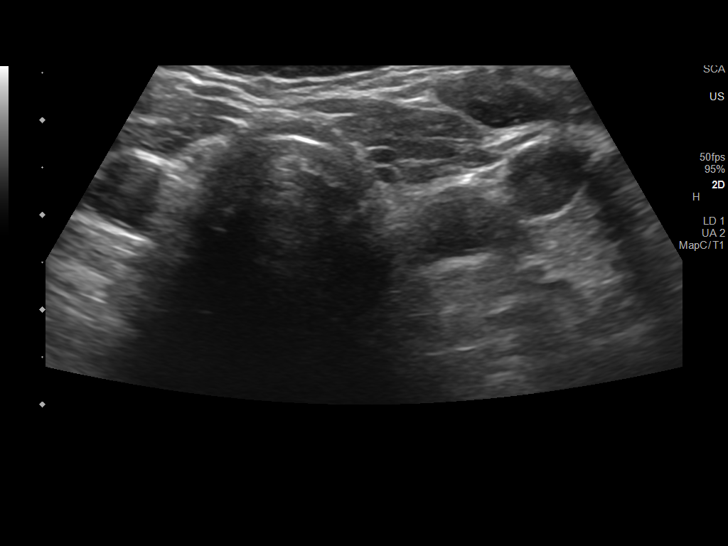
[im 24/24]
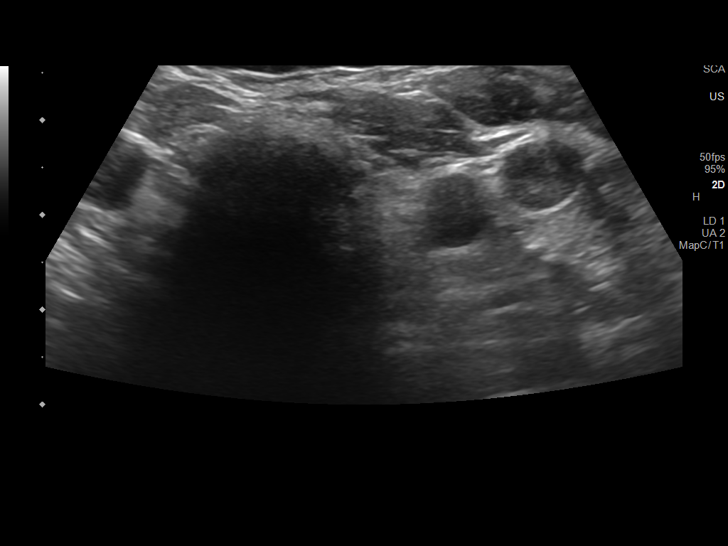

[14 of 24 positions shown; findings below may reference images not displayed]

FINDINGS: Isthmus: No residual thyroid tissue

Right lobe: Residual thyroid tissue measuring 2.1 x 1.1 x 0.6 cm

Left lobe: No residual thyroid tissue

_________________________________________________________

Estimated total number of nodules >/= 1 cm: 1

Number of spongiform nodules >/=  2 cm not described below (TR1): 0

Number of mixed cystic and solid nodules >/= 1.5 cm not described
below (TR2): 0

_________________________________________________________

1.1 x 1.0 x 1.2 cm cystic nodule present within the residual right
thyroid tissue.
IMPRESSION: Postsurgical changes of thyroidectomy. Minimal residual thyroid
tissue seen in the right thyroidectomy bed containing a 1.2 cm
cystic nodule. This nodule does not meet criteria for FNA.

The above is in keeping with the ACR TI-RADS recommendations - [HOSPITAL] 0457;[DATE].

## 2023-02-05 ENCOUNTER — Ambulatory Visit (INDEPENDENT_AMBULATORY_CARE_PROVIDER_SITE_OTHER): Payer: Medicare Other | Admitting: Behavioral Health

## 2023-02-05 DIAGNOSIS — F4310 Post-traumatic stress disorder, unspecified: Secondary | ICD-10-CM | POA: Diagnosis not present

## 2023-02-05 DIAGNOSIS — F411 Generalized anxiety disorder: Secondary | ICD-10-CM | POA: Diagnosis not present
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
## 2023-02-05 NOTE — Progress Notes (Signed)
                Alyssa [name] M Dorothy Landgrebe, LCMHC 

## 2023-02-11 ENCOUNTER — Ambulatory Visit: Payer: Medicare Other | Admitting: Behavioral Health

## 2023-02-13 ENCOUNTER — Encounter: Payer: Self-pay | Admitting: Behavioral Health

## 2023-02-13 ENCOUNTER — Ambulatory Visit (INDEPENDENT_AMBULATORY_CARE_PROVIDER_SITE_OTHER): Payer: Medicare Other | Admitting: Behavioral Health

## 2023-02-13 DIAGNOSIS — F411 Generalized anxiety disorder: Secondary | ICD-10-CM | POA: Diagnosis not present

## 2023-02-13 NOTE — Progress Notes (Signed)
                Alyssa [name] M Alyssa [name], LCMHC 

## 2023-02-13 NOTE — Progress Notes (Addendum)
[facility] Counselor/Therapist Progress Note  Patient ID: Alyssa [name], MRN: [id number],    Date: 02/13/2023  Time Spent: 60 minutes .  This visit was in person.  Treatment Type: Individual Therapy  Reported Symptoms: anxiety  Mental Status Exam: Appearance:  Well Groomed     Behavior: Appropriate  Motor: Normal  Speech/Language:  Clear and Coherent  Affect: Appropriate  Mood: anxious  Thought process: normal  Thought content:   WNL  Sensory/Perceptual disturbances:   WNL  Orientation: oriented to person, place, time/date, situation, day of week, month of year, and year  Attention: Good  Concentration: Good  Memory: WNL  Fund of knowledge:  Good  Insight:   Good  Judgment:  Pt. Struggles to make decisions  Impulse Control: Good   Risk Assessment: Danger to Self:  No Self-injurious Behavior: No Danger to Others: No Duty to Warn:no Physical Aggression / Violence:No  Access to Firearms a concern: No  Gang Involvement:No   Subjective: I met with the patient and her husband.  We reviewed the treatment plan and the patient is in agreement with the goals of reducing anxiety and building self-esteem.  The patient has taken some steps in that direction over the past week.  She has started knitting again.  She had to make some subtle adjustments and her husband noticed that he saw some anxiety at first but she acknowledges that the longer she worked on her blanket the more she enjoyed it and the less anxiety she had.  There is also satisfaction in knowing that she will be needing that for someone else.  She also has used the breathing exercise we talked about and I reviewed the tips skill with she and her husband.  She listens to a Catholic priest who focuses on the importance of gratitude so she is trying to spend some time especially outdoors observing the beauty of nature and creation but also looking for ways to be grateful about other things in her life including  her husband.  She fertilize things that she had seen growth and him since moving to this area and how she wants to strengthen that connection even more.  She also has been reading in addition to listening to the pod cast.  She and her husband like to be out in nature so they take a walk most days either around their condominium complex or somewhere close by.  She notes that she has been calmer over the past few days and she attributes it to all that she has been doing.  We started looking at her difficulty with decision making starting with which coffee cup of tea use.  The husband relates that she always wants her cups to manage or she wants to drink from his.  As she explained that it feels that gives her more of a connection to him but also gives her a sense of security.  That practice frustrates him so for homework I asked the patient to choose a coffee cup for she and her husband and encouraged him to leave the room while she did so as not to become frustrated.  We processed more of how gradual exposure therapy can help reduce anxiety and also build self-esteem.  The patient also said that one of her love languages is needing words of affirmation and feels that her husband can do a better job of that.  We talked about why less important to her and for homework I asked him to begin to make  a list of qualities gifts and talents that he admires and her, not letting her see it and bringing it to the next session.  Interventions: Cognitive Behavioral Therapy  Diagnosis: Generalized anxiety disorder  Plan: Follow-up appointments as scheduled.  Sabas Sous, [facility]

## 2023-02-14 ENCOUNTER — Other Ambulatory Visit (HOSPITAL_BASED_OUTPATIENT_CLINIC_OR_DEPARTMENT_OTHER): Payer: Self-pay | Admitting: Student

## 2023-02-14 DIAGNOSIS — Z1231 Encounter for screening mammogram for malignant neoplasm of breast: Secondary | ICD-10-CM

## 2023-02-18 ENCOUNTER — Encounter (HOSPITAL_BASED_OUTPATIENT_CLINIC_OR_DEPARTMENT_OTHER): Payer: Self-pay

## 2023-02-18 ENCOUNTER — Ambulatory Visit (INDEPENDENT_AMBULATORY_CARE_PROVIDER_SITE_OTHER): Payer: Medicare Other | Admitting: Behavioral Health

## 2023-02-18 ENCOUNTER — Ambulatory Visit (HOSPITAL_BASED_OUTPATIENT_CLINIC_OR_DEPARTMENT_OTHER)
Admission: RE | Admit: 2023-02-18 | Discharge: 2023-02-18 | Disposition: A | Discharge status: Home / Self Care | Payer: Medicare Other | Source: Ambulatory Visit | Attending: Student | Admitting: Student

## 2023-02-18 ENCOUNTER — Encounter: Payer: Self-pay | Admitting: Behavioral Health

## 2023-02-18 DIAGNOSIS — F411 Generalized anxiety disorder: Secondary | ICD-10-CM | POA: Diagnosis not present

## 2023-02-18 DIAGNOSIS — Z1231 Encounter for screening mammogram for malignant neoplasm of breast: Secondary | ICD-10-CM | POA: Insufficient documentation

## 2023-02-18 NOTE — Progress Notes (Signed)
[facility] Counselor/Therapist Progress Note  Patient ID: LENICE SCHMUHL, MRN: [id number],    Date: 02/18/2023  Time Spent: 58 minutes .  This visit was in person.  Treatment Type: Individual Therapy  Reported Symptoms: anxiety  Mental Status Exam: Appearance:  Well Groomed     Behavior: Appropriate  Motor: Normal  Speech/Language:  Clear and Coherent  Affect: Appropriate  Mood: anxious  Thought process: normal  Thought content:   WNL  Sensory/Perceptual disturbances:   WNL  Orientation: oriented to person, place, time/date, situation, day of week, month of year, and year  Attention: Good  Concentration: Good  Memory: WNL  Fund of knowledge:  Good  Insight:   Good  Judgment:  Pt. Struggles to make decisions  Impulse Control: Good   Risk Assessment: Danger to Self:  No Self-injurious Behavior: No Danger to Others: No Duty to Warn:no Physical Aggression / Violence:No  Access to Firearms a concern: No  Gang Involvement:No   Subjective: I met with the patient and her husband.  We discussed the patient's level of comfort with her husband in the session and we will start to taper that off beginning next session.  There was some progress made in the homework.  Yesterday and today she was able to pick out coffee cups for herself and her husband in the morning.  The result of days where she did that at night before going to bed.  She acknowledged ups and downs and her husband echoed that but we challenged him to look at that as a gradual progress.  She is currently taking Seroquel and trazodone to help with sleep and says it takes her a couple of hours in the morning after waking up between 8 and 9 to clear her head.  She wakes up with significant anxiety rating it as about a 7 or 8 first thing in the morning.  She takes her other medications including Lexapro soon after waking up.  That does help alleviate anxiety and the rest of the day is going fairly well but the  2-hour.  The morning where she does not feel very clear and her anxiety is up is concerning for she and her husband.  She took trazodone initially for sleep and that helped and her psychiatrist added Seroquel as a way of helping with the anxiety but there has been no relief with that it is worse first thing in the morning.  I encouraged her to talk to him about medication changes including the possibility of dropping Seroquel if it is just making her drowsy and not alleviating anxiety or to see if there might be a way to take some medication at night so that she does not wake up with significant anxiety.  She agreed to do so.  She also has been using coping skills that we talked about as well as things that she enjoyed doing such as knitting, listening to podcast, prayer and meditation and Bible reading.  We did look extensively at mindfulness and how they practiced that specifically related to being out in nature.  The husband did not complete his homework so assigned to him again to make a list of things that he admires about the patient.  Interventions: Cognitive Behavioral Therapy Diagnosis: Generalized anxiety disorder  Plan: Follow-up appointments as scheduled.  Sabas Sous, [place]ose[place], [facility]

## 2023-02-19 ENCOUNTER — Ambulatory Visit (HOSPITAL_BASED_OUTPATIENT_CLINIC_OR_DEPARTMENT_OTHER): Payer: Medicare Other

## 2023-02-28 ENCOUNTER — Encounter: Payer: Self-pay | Admitting: Behavioral Health

## 2023-02-28 ENCOUNTER — Ambulatory Visit (INDEPENDENT_AMBULATORY_CARE_PROVIDER_SITE_OTHER): Payer: Medicare Other | Admitting: Behavioral Health

## 2023-02-28 DIAGNOSIS — F4010 Social phobia, unspecified: Secondary | ICD-10-CM | POA: Diagnosis not present

## 2023-02-28 DIAGNOSIS — F411 Generalized anxiety disorder: Secondary | ICD-10-CM

## 2023-02-28 NOTE — Progress Notes (Addendum)
[facility] Counselor/Therapist Progress Note  Patient ID: Alyssa [name], MRN: [id number],    Date: 02/28/2023  Time Spent: 58 minutes .  This visit was via video visit the pt. Was at home and the therapist was in his home office.  Treatment Type: Individual Therapy  Reported Symptoms: anxiety  Mental Status Exam: Appearance:  Well Groomed     Behavior: Appropriate  Motor: Normal  Speech/Language:  Clear and Coherent  Affect: Appropriate  Mood: anxious  Thought process: normal  Thought content:   WNL  Sensory/Perceptual disturbances:   WNL  Orientation: oriented to person, place, time/date, situation, day of week, month of year, and year  Attention: Good  Concentration: Good  Memory: WNL  Fund of knowledge:  Good  Insight:   Good  Judgment:  Pt. Struggles to make decisions  Impulse Control: Good   Risk Assessment: Danger to Self:  No Self-injurious Behavior: No Danger to Others: No Duty to Warn:no Physical Aggression / Violence:No  Access to Firearms a concern: No  Gang Involvement:No   Subjective: The patient reports and the husband verifies that the patient has done a much better job coffee cups and being able to live with that decision.  At times she does it at night and at times in the morning.  Her husband also is able to go into another room and do something else during that time.  She has started to taper off one of her medications from 1 to half of a pill in the morning.  She reports an increase in anxiety in the morning but also sees more anxiety in the afternoon since starting the taper.  It is something she and her psychiatrist discussed because the psychiatrist was concerned about the long-term effects of the medication.  She has not addressed that with the doctor yet but I encouraged her to reach out to the doctor.  She said they had talked about adding something else on if the anxiety went up.  1 of those possibility was a Seroquel but the patient  does not want that.  She indicated there was another medication which she could not remember the name of.  She has not reached out to the doctor about tapering off of the Seroquel at night.  The husband also indicates that since tapering down on the medication her ability to make a decision and the anxiety that drives that has gotten worse.  This appointment was at 11:00 and indicated there were multiple close changes prior to this session starting.  There are multiple factors playing into her choice including color comfort time of year but also validation from her husband because she wants to please him by wearing something that he gave her or that she knows that he likes.  As an exercise gradual reduction/exposure therapy ask her to pick out 3 possibilities of shirts for the next day and to make a choice of 1 the next morning based on her criteria that we discussed.  I ask her to literally hang the other 2 back in the closet and close the door both literally and symbolically as a way of telling herself that she cannot go back to make another choice unless something happens such as a get soiled with the weather changes dramatically.  We also talked about the importance of her self validating instead of leaning on her husband to do so much of it.  His homework is to be more validating because her love language is words of  affirmation.  He loves that they are affectionate and intimate but says it is becoming more frequent and he needs a little separation from the constancy of it.  She says that when he withdraws it makes her feel less validated.  We talked about what her own internal validation looks like about who she is personality and gifts wise and encouraged her to say those to herself. Progress of treatment plan 20%  Interventions: Cognitive Behavioral Therapy Diagnosis: Generalized anxiety disorder Target date: September 09, 2023 Plan: Follow-up appointments as scheduled.  Sabas Sous,  [place] Mariam Helbert, [place] Aarika Moon, [facility]

## 2023-03-03 IMAGING — CT CT HEART MORP W/ CTA COR W/ SCORE W/ CA W/CM &/OR W/O CM
1 series · 3 of 5 positions shown, 4 images · non-contrast
Comparison: None.

Addendum:
CLINICAL DATA: 70 Year old White Female

EXAM:
Cardiac/Coronary  CTA
TECHNIQUE: The patient was scanned on a [employer] scanner.

[Series 777: — · 3 of 5 slices shown, 4 images]
[im 2/5  vessel]
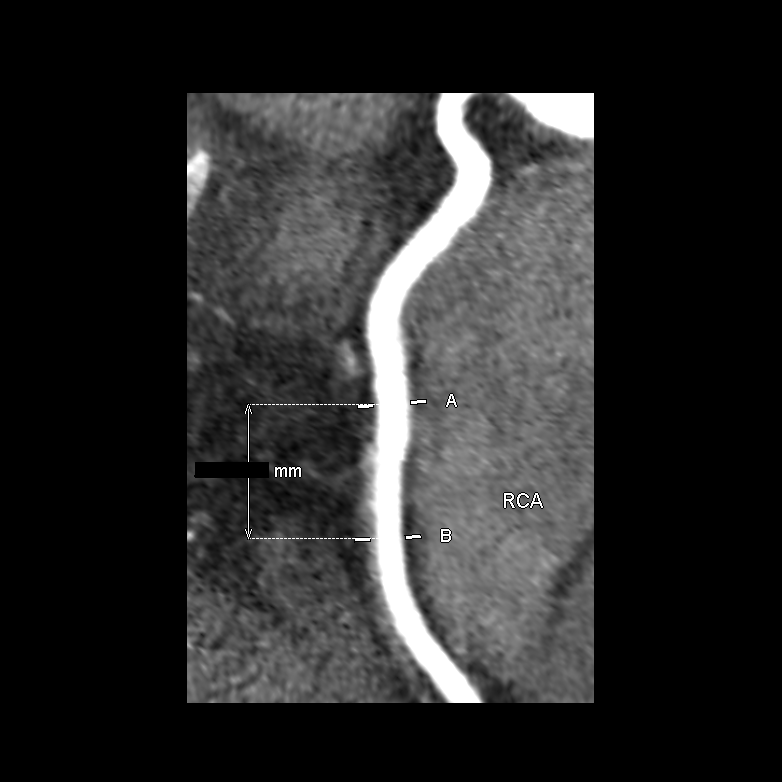
[im 2/5  lung]
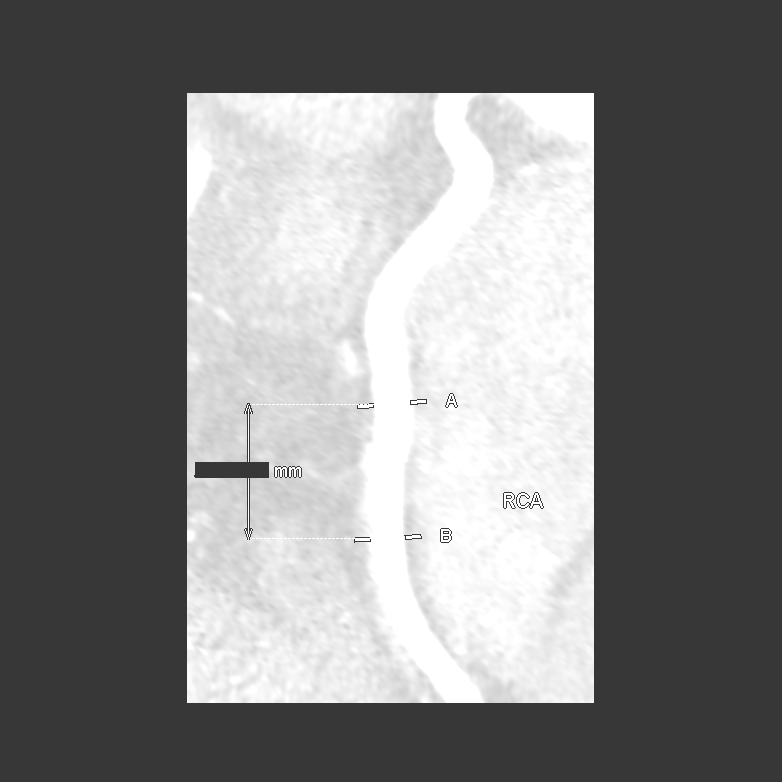
[im 3/5  vessel]
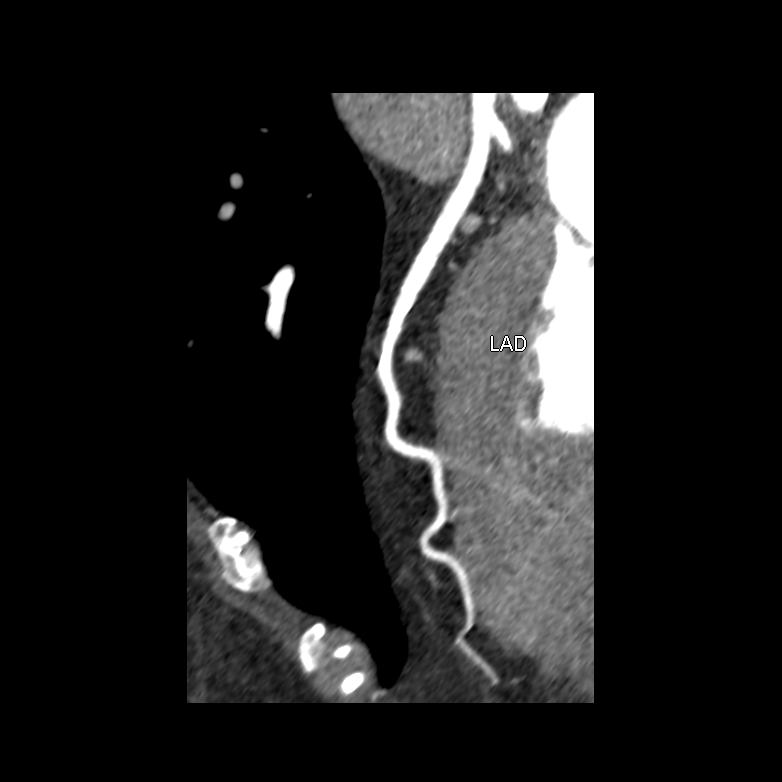
[im 4/5  vessel]
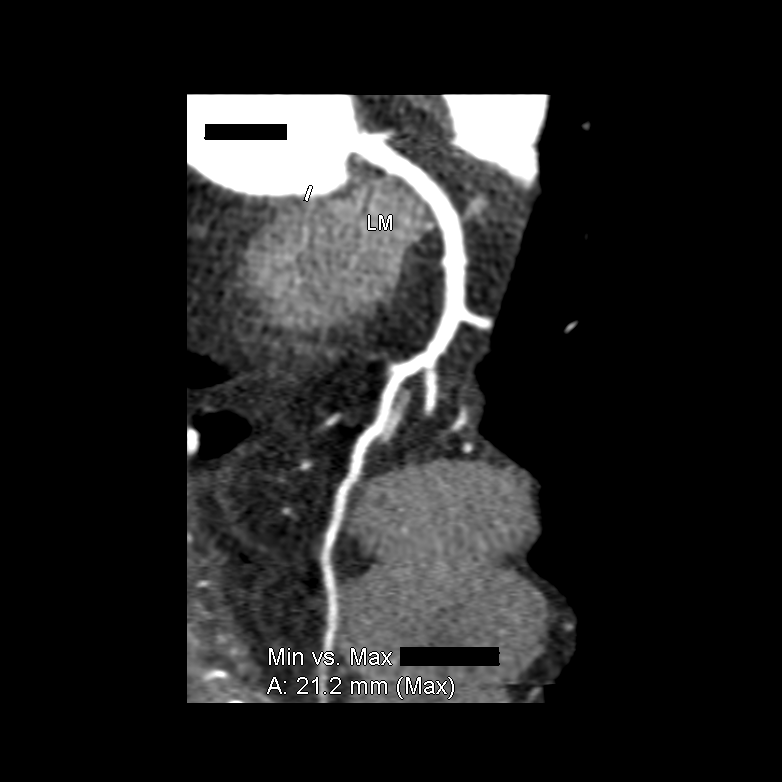

[3 of 5 positions shown; findings below may reference images not displayed]

FINDINGS: Scan was triggered in the descending thoracic aorta. Axial
non-contrast 3 mm slices were carried out through the heart. The
data set was analyzed on a dedicated work station and scored using
the Agatson method. Gantry rotation speed was 250 msecs and
collimation was .6 mm. 0.8 mg of sl NTG was given. The 3D data set
was reconstructed in 5% intervals of the 67-82 % of the R-R cycle.
Diastolic phases were analyzed on a dedicated work station using
MPR, MIP and VRT modes. The patient received 95 cc of contrast.

Aorta: Normal size. Descending aortic scattered calcifications. No
dissection.

Main Pulmonary Artery: Normal size of the pulmonary artery.

Aortic Valve:  Tri-leaflet.  No calcifications.

Coronary Arteries:  Normal coronary origin.  Right dominance.

Coronary Calcium Score:

Left main: 0

Left anterior descending artery: 0

Left circumflex artery: 0

Right coronary artery: 0

Total: 0

Percentile: 1st for age, sex, and race matched control.

RCA is a large dominant artery that gives rise to PDA and PLA. There
is no significant plaque.

Left main is a large artery that gives rise to LAD and LCX arteries.
There is no significant plaque.

LAD is a large vessel that gives rise to three diagonal branches.
There is no significant plaque.

LCX is a non-dominant artery with a small OM1. There is no
significant plaque.

Other findings:

Normal pulmonary vein drainage into the left atrium.

Normal left atrial appendage without a thrombus.

Extra-cardiac findings: See attached radiology report for
non-cardiac structures.

Patient motion and associated stair step artifact.
IMPRESSION: 1. Coronary calcium score of 0. This was 1st percentile for age,
sex, and race matched control.

2. Normal coronary origin with right dominance.

3. CAD-RADS 0. No evidence of CAD (0%). Consider non-atherosclerotic
causes of chest pain.

RECOMMENDATIONS:



If CAC = 0, it is reasonable to withhold statin therapy and reassess
in 5 to 10 years, as long as higher risk conditions are absent
(diabetes mellitus, family history of premature CHD in first degree
relatives (males <55 years; females <65 years), cigarette smoking,
LDL >=190 mg/dL or other independent risk factors).

If CAC is 1 to 99, it is reasonable to initiate statin therapy for
patients >=55 years of age.

If CAC is >=100 or >=75th percentile, it is reasonable to initiate
statin therapy at any age.

Cardiology referral should be considered for patients with CAC
scores =400 or >=75th percentile.

*7496 AHA/ACC/AACVPR/AAPA/ABC/GINES/OLAVE/SHWETA/Jim/HOLLINGSWORTH/JUMPER/REVELLO
Guideline on the Management of Blood Cholesterol: A Report of the
American College of Cardiology/American Heart Association Task Force
on Clinical Practice Guidelines. J Am Coll Cardiol.
1373;73(24):6566-6677.

EXAM:
OVER-READ INTERPRETATION  CT CHEST

The following report is an over-read performed by radiologist Dr.
does not include interpretation of cardiac or coronary anatomy or
pathology. The coronary calcium score/coronary CTA interpretation by
the cardiologist is attached.
FINDINGS: No suspicious nodules, masses, or infiltrates are identified in the
visualized portion of the lungs. No pleural fluid seen.

The visualized portions of the mediastinum and chest wall are
unremarkable.
IMPRESSION: No significant non-cardiac abnormality in visualized portion of the
thorax.

*** End of Addendum ***
FINDINGS: Scan was triggered in the descending thoracic aorta. Axial
non-contrast 3 mm slices were carried out through the heart. The
data set was analyzed on a dedicated work station and scored using
the Agatson method. Gantry rotation speed was 250 msecs and
collimation was .6 mm. 0.8 mg of sl NTG was given. The 3D data set
was reconstructed in 5% intervals of the 67-82 % of the R-R cycle.
Diastolic phases were analyzed on a dedicated work station using
MPR, MIP and VRT modes. The patient received 95 cc of contrast.

Aorta: Normal size. Descending aortic scattered calcifications. No
dissection.

Main Pulmonary Artery: Normal size of the pulmonary artery.

Aortic Valve:  Tri-leaflet.  No calcifications.

Coronary Arteries:  Normal coronary origin.  Right dominance.

Coronary Calcium Score:

Left main: 0

Left anterior descending artery: 0

Left circumflex artery: 0

Right coronary artery: 0

Total: 0

Percentile: 1st for age, sex, and race matched control.

RCA is a large dominant artery that gives rise to PDA and PLA. There
is no significant plaque.

Left main is a large artery that gives rise to LAD and LCX arteries.
There is no significant plaque.

LAD is a large vessel that gives rise to three diagonal branches.
There is no significant plaque.

LCX is a non-dominant artery with a small OM1. There is no
significant plaque.

Other findings:

Normal pulmonary vein drainage into the left atrium.

Normal left atrial appendage without a thrombus.

Extra-cardiac findings: See attached radiology report for
non-cardiac structures.

Patient motion and associated stair step artifact.
IMPRESSION: 1. Coronary calcium score of 0. This was 1st percentile for age,
sex, and race matched control.

2. Normal coronary origin with right dominance.

3. CAD-RADS 0. No evidence of CAD (0%). Consider non-atherosclerotic
causes of chest pain.

RECOMMENDATIONS:



If CAC = 0, it is reasonable to withhold statin therapy and reassess
in 5 to 10 years, as long as higher risk conditions are absent
(diabetes mellitus, family history of premature CHD in first degree
relatives (males <55 years; females <65 years), cigarette smoking,
LDL >=190 mg/dL or other independent risk factors).

If CAC is 1 to 99, it is reasonable to initiate statin therapy for
patients >=55 years of age.

If CAC is >=100 or >=75th percentile, it is reasonable to initiate
statin therapy at any age.

Cardiology referral should be considered for patients with CAC
scores =400 or >=75th percentile.

*7496 AHA/ACC/AACVPR/AAPA/ABC/GINES/OLAVE/SHWETA/Jim/HOLLINGSWORTH/JUMPER/REVELLO
Guideline on the Management of Blood Cholesterol: A Report of the
American College of Cardiology/American Heart Association Task Force
on Clinical Practice Guidelines. J Am Coll Cardiol.
1373;73(24):6566-6677.

## 2023-03-05 ENCOUNTER — Ambulatory Visit (INDEPENDENT_AMBULATORY_CARE_PROVIDER_SITE_OTHER): Payer: Medicare Other | Admitting: Behavioral Health

## 2023-03-05 ENCOUNTER — Encounter: Payer: Self-pay | Admitting: Behavioral Health

## 2023-03-05 DIAGNOSIS — F411 Generalized anxiety disorder: Secondary | ICD-10-CM | POA: Diagnosis not present

## 2023-03-05 NOTE — Progress Notes (Signed)
[facility] Counselor/Therapist Progress Note  Patient ID: Alyssa [name], MRN: [id number],    Date: 03/05/2023  Time Spent: 58 minutes .  This visit was in person.  Treatment Type: Individual Therapy  Reported Symptoms: anxiety  Mental Status Exam: Appearance:  Well Groomed     Behavior: Appropriate  Motor: Normal  Speech/Language:  Clear and Coherent  Affect: Appropriate  Mood: anxious  Thought process: normal  Thought content:   WNL  Sensory/Perceptual disturbances:   WNL  Orientation: oriented to person, place, time/date, situation, day of week, month of year, and year  Attention: Good  Concentration: Good  Memory: WNL  Fund of knowledge:  Good  Insight:   Good  Judgment:  Pt. Struggles to make decisions  Impulse Control: Good   Risk Assessment: Danger to Self:  No Self-injurious Behavior: No Danger to Others: No Duty to Warn:no Physical Aggression / Violence:No  Access to Firearms a concern: No  Gang Involvement:No   Subjective: The patient and her husband reported that anxiety has been increasingly more difficult over the past week.  It is rougher in the mornings particular trigger.  Using coffee cups to make decisions helped in that particular situation but both report that anxiety has been higher especially as exhibited with difficulty in decision making.  She is still on half a dose of lorazepam and takes that in the morning around 10:00 with Lexapro.  She is meeting with her psychiatrist via video session tomorrow and we talked about different things she could ask in terms of possible medication changes/additions.  Her husband has done his homework and his therapist suggested that he share the list with the patient which I encouraged him to do before I meet with him again next week.  We talked about the best way to do that in terms of setting and time.  We also talked about changing up her schedule walking first thing in the morning as opposed to  waiting until afternoon and giving each other some time apart after the walk.  We also talked about the patient challenging some of her anxious thoughts and I encouraged her to make a list of 2-3 of those thoughts and we talked about how to reframe them.  That appeared to drive her anxiety up so I provided some examples. Progress of treatment plan 20%  Interventions: Cognitive Behavioral Therapy Diagnosis: Generalized anxiety disorder  Plan: Follow-up appointments as scheduled.  Sabas Sous, [place] Lace [place], [place] M Bransen [place], [place] Counselor/Therapist Progress Note  Patient ID: Alyssa [name], MRN: [id number],    Date: 03/05/2023  Time Spent: 58 minutes .  This visit was via [place]. The pt. Was at home and the therapist was in his home office.  Treatment Type: Individual Therapy  Reported Symptoms: anxiety  Mental Status Exam: Appearance:  Well Groomed     Behavior: Appropriate  Motor: Normal  Speech/Language:  Clear and Coherent  Affect: Appropriate  Mood: anxious  Thought process: normal  Thought content:   WNL  Sensory/Perceptual disturbances:   WNL  Orientation: oriented to person, place, time/date, situation, day of week, month of year, and year  Attention: Good  Concentration: Good  Memory: WNL  Fund of knowledge:  Good  Insight:   Good  Judgment:  Pt. Struggles  to make decisions  Impulse Control: Good   Risk Assessment: Danger to Self:  No Self-injurious Behavior: No Danger to Others: No Duty to Warn:no Physical Aggression / Violence:No  Access to Firearms a concern: No  Gang Involvement:No   Subjective: The patient reports and the husband verifies that the patient has done a much better job coffee cups and being able to live with that decision.  At times she does it at night and at times in the morning.  Her husband also is able to go into another room and do  something else during that time.  She has started to taper off one of her medications from 1 to half of a pill in the morning.  She reports an increase in anxiety in the morning but also sees more anxiety in the afternoon since starting the taper.  It is something she and her psychiatrist discussed because the psychiatrist was concerned about the long-term effects of the medication.  She has not addressed that with the doctor yet but I encouraged her to reach out to the doctor.  She said they had talked about adding something else on if the anxiety went up.  1 of those possibility was a Seroquel but the patient does not want that.  She indicated there was another medication which she could not remember the name of.  She has not reached out to the doctor about tapering off of the Seroquel at night.  The husband also indicates that since tapering down on the medication her ability to make a decision and the anxiety that drives that has gotten worse.  This appointment was at 11:00 and indicated there were multiple close changes prior to this session starting.  There are multiple factors playing into her choice including color comfort time of year but also validation from her husband because she wants to please him by wearing something that he gave her or that she knows that he likes.  As an exercise gradual reduction/exposure therapy ask her to pick out 3 possibilities of shirts for the next day and to make a choice of 1 the next morning based on her criteria that we discussed.  I ask her to literally hang the other 2 back in the closet and close the door both literally and symbolically as a way of telling herself that she cannot go back to make another choice unless something happens such as a get soiled with the weather changes dramatically.  We also talked about the importance of her self validating instead of leaning on her husband to do so much of it.  His homework is to be more validating because her love  language is words of affirmation.  He loves that they are affectionate and intimate but says it is becoming more frequent and he needs a little separation from the constancy of it.  She says that when he withdraws it makes her feel less validated.  We talked about what her own internal validation looks like about who she is personality and gifts wise and encouraged her to say those to herself. Progress of treatment plan 20%  Interventions: Cognitive Behavioral Therapy Diagnosis: Generalized anxiety disorder  Plan: Follow-up appointments as scheduled.  Sabas Sous, [place]  Lorelei Pont, [facility]

## 2023-03-06 ENCOUNTER — Ambulatory Visit: Payer: Medicare Other | Admitting: Behavioral Health

## 2023-03-12 ENCOUNTER — Encounter: Payer: Self-pay | Admitting: Behavioral Health

## 2023-03-12 ENCOUNTER — Ambulatory Visit (INDEPENDENT_AMBULATORY_CARE_PROVIDER_SITE_OTHER): Payer: Medicare Other | Admitting: Behavioral Health

## 2023-03-12 DIAGNOSIS — F411 Generalized anxiety disorder: Secondary | ICD-10-CM

## 2023-03-12 DIAGNOSIS — F410 Panic disorder [episodic paroxysmal anxiety] without agoraphobia: Secondary | ICD-10-CM | POA: Diagnosis not present

## 2023-03-12 DIAGNOSIS — F4010 Social phobia, unspecified: Secondary | ICD-10-CM

## 2023-03-12 NOTE — Progress Notes (Signed)
[facility] Counselor/Therapist Progress Note  Patient ID: Alyssa [name], MRN: [id number],    Date: 03/12/2023  Time Spent: 58 minutes  spent in person with the patient.  Treatment Type: Individual Therapy  Reported Symptoms: anxiety  Mental Status Exam: Appearance:  Well Groomed     Behavior: Appropriate  Motor: Normal  Speech/Language:  Clear and Coherent  Affect: Appropriate  Mood: anxious  Thought process: normal  Thought content:   WNL  Sensory/Perceptual disturbances:   WNL  Orientation: oriented to person, place, time/date, situation, day of week, month of year, and year  Attention: Good  Concentration: Good  Memory: WNL  Fund of knowledge:  Good  Insight:   Good  Judgment:  Pt. Struggles to make decisions  Impulse Control: Good   Risk Assessment: Danger to Self:  No Self-injurious Behavior: No Danger to Others: No Duty to Warn:no Physical Aggression / Violence:No  Access to Firearms a concern: No  Gang Involvement:No  Subjective: The patient presented with extreme anxiety today.  Her husband indicated that she did not want to come in and when she got to the lobby she wanted to leave.  I applauded her encourage for pushing to her anxiety.  They both say has been a fairly difficult week with her anxiety.  They were not able to be with her psychiatrist via video session last Tuesday but are rescheduled for this Thursday the fourth.  He did sign a release for me to be able to speak to her.  The husband reports an increase in difficulty with making any kind of decisions and questioning herself when she does not having regrets.  In the previous session we talked about trying to change things up by going walking in the morning earlier than they have been.  They were not able to get that done.  I encouraged him to fall back on that and try again this week.  She did not take her medication today but says she has been compliant over the week.  She is going to take  medication when she got home today.  I encouraged her to talk about medication evaluation/change with Dr. Rodolph Bong on Thursday.  We talked about the importance of mindfulness practice different ways to do that.  I did progressive muscle relaxation with her in the office and encourage practice of that..  Also introduced a vagus nerve stimulation coping skill.  Progress of treatment plan 20%  Interventions: Cognitive Behavioral Therapy Diagnosis: Generalized anxiety disorder  Plan: Follow-up appointments scheduled weekly in person.  Sabas Sous, [place] Tanner [place], [place] Counselor/Therapist Progress Note  Patient ID: Alyssa [name], MRN: [id number],    Date: 03/12/2023  Time Spent: 58 minutes .  This visit was via [place]. The pt. Was at home and the therapist was in his home office.  Treatment Type: Individual Therapy  Reported Symptoms: anxiety  Mental Status Exam: Appearance:  Well Groomed     Behavior: Appropriate  Motor: Normal  Speech/Language:  Clear and Coherent  Affect: Appropriate  Mood: anxious  Thought process: normal  Thought content:   WNL  Sensory/Perceptual disturbances:   WNL  Orientation: oriented to person, place, time/date, situation, day of week, month of year, and year  Attention: Good  Concentration: Good  Memory: WNL  Fund of knowledge:  Good  Insight:   Good  Judgment:  Pt. Struggles to make decisions  Impulse Control: Good   Risk Assessment: Danger to Self:  No Self-injurious Behavior: No Danger to Others: No Duty to Warn:no Physical Aggression / Violence:No  Access to Firearms a concern: No  Gang Involvement:No   Subjective: The patient reports and the husband verifies that the patient has done a much better job coffee cups and being able to live with that decision.  At times she does it at night and at times in the morning.  Her husband also  is able to go into another room and do something else during that time.  She has started to taper off one of her medications from 1 to half of a pill in the morning.  She reports an increase in anxiety in the morning but also sees more anxiety in the afternoon since starting the taper.  It is something she and her psychiatrist discussed because the psychiatrist was concerned about the long-term effects of the medication.  She has not addressed that with the doctor yet but I encouraged her to reach out to the doctor.  She said they had talked about adding something else on if the anxiety went up.  1 of those possibility was a Seroquel but the patient does not want that.  She indicated there was another medication which she could not remember the name of.  She has not reached out to the doctor about tapering off of the Seroquel at night.  The husband also indicates that since tapering down on the medication her ability to make a decision and the anxiety that drives that has gotten worse.  This appointment was at 11:00 and indicated there were multiple close changes prior to this session starting.  There are multiple factors playing into her choice including color comfort time of year but also validation from her husband because she wants to please him by wearing something that he gave her or that she knows that he likes.  As an exercise gradual reduction/exposure therapy ask her to pick out 3 possibilities of shirts for the next day and to make a choice of 1 the next morning based on her criteria that we discussed.  I ask her to literally hang the other 2 back in the closet and close the door both literally and symbolically as a way of telling herself that she cannot go back to make another choice unless something happens such as a get soiled with the weather changes dramatically.  We also talked about the importance of her self validating instead of leaning on her husband to do so much of it.  His homework is to be  more validating because her love language is words of affirmation.  He loves that they are affectionate and intimate but says it is becoming more frequent and he needs a little separation from the constancy of it.  She says that when he withdraws it makes her feel less validated.  We talked about what her own internal validation looks like about who she is personality and gifts wise and encouraged her to say those to herself. Progress of treatment plan 20%  Interventions: Cognitive Behavioral Therapy Diagnosis: Generalized anxiety disorder  Plan: Follow-up appointments as scheduled.  Sabas Sous, [facility]  Sabas Sous, [place] Andru Genter, [facility]

## 2023-03-18 ENCOUNTER — Ambulatory Visit (INDEPENDENT_AMBULATORY_CARE_PROVIDER_SITE_OTHER): Payer: Medicare Other | Admitting: Behavioral Health

## 2023-03-18 ENCOUNTER — Encounter: Payer: Self-pay | Admitting: Behavioral Health

## 2023-03-18 DIAGNOSIS — F411 Generalized anxiety disorder: Secondary | ICD-10-CM

## 2023-03-18 DIAGNOSIS — F4010 Social phobia, unspecified: Secondary | ICD-10-CM | POA: Diagnosis not present

## 2023-03-18 NOTE — Progress Notes (Signed)
[facility] Counselor/Therapist Progress Note  Patient ID: Alyssa [name], MRN: [id number],    Date: 03/18/2023  Time Spent: 58 minutes  spent in person with the patient.  Treatment Type: Individual Therapy  Reported Symptoms: anxiety  Mental Status Exam: Appearance:  Well Groomed     Behavior: Appropriate  Motor: Normal  Speech/Language:  Clear and Coherent  Affect: Appropriate  Mood: anxious  Thought process: normal  Thought content:   WNL  Sensory/Perceptual disturbances:   WNL  Orientation: oriented to person, place, time/date, situation, day of week, month of year, and year  Attention: Good  Concentration: Good  Memory: WNL  Fund of knowledge:  Good  Insight:   Good  Judgment:  Pt. Struggles to make decisions  Impulse Control: Good   Risk Assessment: Danger to Self:  No Self-injurious Behavior: No Danger to Others: No Duty to Warn:no Physical Aggression / Violence:No  Access to Firearms a concern: No  Gang Involvement:No  Subjective: The patient appeared with brighter and less anxious affect today.  She and her husband did say there had been some anger over the past few days.  She received a phone call last week from her psychiatrist office about getting a release signed for me to be able to speak with her psychiatric PA.  She felt that issue had been resolved when we signed paperwork last session.  I informed them that I had reached out to the mood treatment center and had not heard back from them.  I will attempt to fax him a copy of the release but informed the patient and her husband that the mood treatment center may have a release form they would prefer the patient to sign on line.  The patient has been angry that she is having a fight anxiety which she said resurface when they moved to this area.  She had anxiety while they were living in [place] but had not been on medication for a few years.  It appears that a lot of the frustration  is not being able to have a say so when they initially moved into their home.  They sought a lot but did not see it in person.  Because he had some environmental mold/mildew issues in [place] to had a tested here before buying.  There were still additional issues which they had to pay to get tested and treated.  She said that she felt like they just moved and when they thought everything was tested and she was not satisfied and felt that her husband was not open to discussion about it.  She does not feel those issues have been resolved for the most part she likes where they live.  There was a time they lived in [place] while they could not be in the home that she enjoyed being there.  They have considered moving to more of a retirement community that still on the table.  The patient is doing a lot of good things to treat anxiety so we focused more today on when and how that started and what she feels that she can begin to control.  She did start a new medication but she or her husband could not remember the name of it.  She also came off of Seroquel and feels that her mood has been a little brighter in the last day or so.  We talked about the importance of mindfulness practice different ways to do that.  I did progressive muscle relaxation with  her in the office and encourage practice of that..  Also introduced a vagus nerve stimulation coping skill.  Progress of treatment plan 20%  Interventions: Cognitive Behavioral Therapy Diagnosis: Generalized anxiety disorder  Plan: Follow-up appointments scheduled weekly in person.  French Anaraig M Mccall Will, [facility]                                French Anaraig M Donte Kary, [facility]  [facility] Counselor/Therapist Progress Note  Patient ID: Alyssa [name], MRN: [id number],    Date: 03/18/2023  Time Spent: 58 minutes .  This visit was via Careg[facility]. The pt. Was at home and the therapist was in his home office.  Treatment Type:  Individual Therapy  Reported Symptoms: anxiety  Mental Status Exam: Appearance:  Well Groomed     Behavior: Appropriate  Motor: Normal  Speech/Language:  Clear and Coherent  Affect: Appropriate  Mood: anxious  Thought process: normal  Thought content:   WNL  Sensory/Perceptual disturbances:   WNL  Orientation: oriented to person, place, time/date, situation, day of week, month of year, and year  Attention: Good  Concentration: Good  Memory: WNL  Fund of knowledge:  Good  Insight:   Good  Judgment:  Pt. Struggles to make decisions  Impulse Control: Good   Risk Assessment: Danger to Self:  No Self-injurious Behavior: No Danger to Others: No Duty to Warn:no Physical Aggression / Violence:No  Access to Firearms a concern: No  Gang Involvement:No   Subjective: The patient reports and the husband verifies that the patient has done a much better job coffee cups and being able to live with that decision.  At times she does it at night and at times in the morning.  Her husband also is able to go into another room and do something else during that time.  She has started to taper off one of her medications from 1 to half of a pill in the morning.  She reports an increase in anxiety in the morning but also sees more anxiety in the afternoon since starting the taper.  It is something she and her psychiatrist discussed because the psychiatrist was concerned about the long-term effects of the medication.  She has not addressed that with the doctor yet but I encouraged her to reach out to the doctor.  She said they had talked about adding something else on if the anxiety went up.  1 of those possibility was a Seroquel but the patient does not want that.  She indicated there was another medication which she could not remember the name of.  She has not reached out to the doctor about tapering off of the Seroquel at night.  The husband also indicates that since tapering down on the medication her  ability to make a decision and the anxiety that drives that has gotten worse.  This appointment was at 11:00 and indicated there were multiple close changes prior to this session starting.  There are multiple factors playing into her choice including color comfort time of year but also validation from her husband because she wants to please him by wearing something that he gave her or that she knows that he likes.  As an exercise gradual reduction/exposure therapy ask her to pick out 3 possibilities of shirts for the next day and to make a choice of 1 the next morning based on her criteria that we discussed.  I ask her to literally  hang the other 2 back in the closet and close the door both literally and symbolically as a way of telling herself that she cannot go back to make another choice unless something happens such as a get soiled with the weather changes dramatically.  We also talked about the importance of her self validating instead of leaning on her husband to do so much of it.  His homework is to be more validating because her love language is words of affirmation.  He loves that they are affectionate and intimate but says it is becoming more frequent and he needs a little separation from the constancy of it.  She says that when he withdraws it makes her feel less validated.  We talked about what her own internal validation looks like about who she is personality and gifts wise and encouraged her to say those to herself. Progress of treatment plan 20%  Interventions: Cognitive Behavioral Therapy Diagnosis: Generalized anxiety disorder  Plan: Follow-up appointments as scheduled.  French Ana, [facility]                                              French Ana, [facility]               French Ana, [facility]

## 2023-03-25 ENCOUNTER — Encounter: Payer: Self-pay | Admitting: *Deleted

## 2023-03-26 ENCOUNTER — Encounter: Payer: Self-pay | Admitting: Behavioral Health

## 2023-03-26 ENCOUNTER — Ambulatory Visit (INDEPENDENT_AMBULATORY_CARE_PROVIDER_SITE_OTHER): Payer: Medicare Other | Admitting: Behavioral Health

## 2023-03-26 DIAGNOSIS — F411 Generalized anxiety disorder: Secondary | ICD-10-CM

## 2023-03-26 NOTE — Progress Notes (Signed)
[facility] Counselor/Therapist Progress Note  Patient ID: Alyssa [name], MRN: [id number],    Date: 03/26/2023  Time Spent: 58 minutes  spent in person with the patient.  Treatment Type: Individual Therapy  Reported Symptoms: anxiety  Mental Status Exam: Appearance:  Well Groomed     Behavior: Appropriate  Motor: Normal  Speech/Language:  Clear and Coherent  Affect: Appropriate  Mood: anxious  Thought process: normal  Thought content:   WNL  Sensory/Perceptual disturbances:   WNL  Orientation: oriented to person, place, time/date, situation, day of week, month of year, and year  Attention: Good  Concentration: Good  Memory: WNL  Fund of knowledge:  Good  Insight:   Good  Judgment:  Pt. Struggles to make decisions  Impulse Control: Good   Risk Assessment: Danger to Self:  No Self-injurious Behavior: No Danger to Others: No Duty to Warn:no Physical Aggression / Violence:No  Access to Firearms a concern: No  Gang Involvement:No  Subjective: The patient appeared with brighter and less anxious affect today.  She is beginning to see some positive benefits from the new medication.  She is taking it earlier in the morning and says that her anxiety starts to subside earlier and it is less intense.  Coming off the Seroquel she is now sleeping better and less groggy in the morning.  She still says she wakes up a few times throughout the night but goes back to sleep fairly easily.  She also has tried some of the things we talked about including limiting her choices with picking out what to wear.  She allows herself 1 time did not change and says that even though she stuck to that at times she still wrestles with that if she made the right decision but she is using cognitive challenging to benefit her with that part of it.  She is working hard to practice gratitude even in the small things and he is benefiting from that also.  We looked at wrestling with being limited to 1  choice step 2 with a process and how she is challenging that with success.  For homework I encouraged him to take a weekend trip somewhere.  She responded by planning a trip meeting this weekend by jogging across the country to a [address] town in [place] that she and her husband enjoyed where they will stay for about 4 weeks.  They alternate driving making multiple stops on the way out visiting friends in [place].  We talked about how important it was that she was able to make those decisions including how far they drove, where they stayed, how long they stayed with minimal anxiety. We talked about the importance of mindfulness practice different ways to do that.  I did progressive muscle relaxation with her in the office and encourage practice of that..  Also introduced a vagus nerve stimulation coping skill.  Progress of treatment plan 25%  Interventions: Cognitive Behavioral Therapy Diagnosis: Generalized anxiety disorder  Plan: Follow-up appointments scheduled weekly in person.  French Ana, [facility]                                French Ana, [facility]  [facility] Counselor/Therapist Progress Note  Patient ID: Alyssa [name], MRN: [id number],    Date: 03/26/2023  Time Spent: 58 minutes .  This visit was via Caregility. The pt. Was at home and the therapist was  in his home office.  Treatment Type: Individual Therapy  Reported Symptoms: anxiety  Mental Status Exam: Appearance:  Well Groomed     Behavior: Appropriate  Motor: Normal  Speech/Language:  Clear and Coherent  Affect: Appropriate  Mood: anxious  Thought process: normal  Thought content:   WNL  Sensory/Perceptual disturbances:   WNL  Orientation: oriented to person, place, time/date, situation, day of week, month of year, and year  Attention: Good  Concentration: Good  Memory: WNL  Fund of knowledge:  Good  Insight:   Good  Judgment:  Pt. Struggles  to make decisions  Impulse Control: Good   Risk Assessment: Danger to Self:  No Self-injurious Behavior: No Danger to Others: No Duty to Warn:no Physical Aggression / Violence:No  Access to Firearms a concern: No  Gang Involvement:No   Subjective: The patient reports and the husband verifies that the patient has done a much better job coffee cups and being able to live with that decision.  At times she does it at night and at times in the morning.  Her husband also is able to go into another room and do something else during that time.  She has started to taper off one of her medications from 1 to half of a pill in the morning.  She reports an increase in anxiety in the morning but also sees more anxiety in the afternoon since starting the taper.  It is something she and her psychiatrist discussed because the psychiatrist was concerned about the long-term effects of the medication.  She has not addressed that with the doctor yet but I encouraged her to reach out to the doctor.  She said they had talked about adding something else on if the anxiety went up.  1 of those possibility was a Seroquel but the patient does not want that.  She indicated there was another medication which she could not remember the name of.  She has not reached out to the doctor about tapering off of the Seroquel at night.  The husband also indicates that since tapering down on the medication her ability to make a decision and the anxiety that drives that has gotten worse.  This appointment was at 11:00 and indicated there were multiple close changes prior to this session starting.  There are multiple factors playing into her choice including color comfort time of year but also validation from her husband because she wants to please him by wearing something that he gave her or that she knows that he likes.  As an exercise gradual reduction/exposure therapy ask her to pick out 3 possibilities of shirts for the next day and to  make a choice of 1 the next morning based on her criteria that we discussed.  I ask her to literally hang the other 2 back in the closet and close the door both literally and symbolically as a way of telling herself that she cannot go back to make another choice unless something happens such as a get soiled with the weather changes dramatically.  We also talked about the importance of her self validating instead of leaning on her husband to do so much of it.  His homework is to be more validating because her love language is words of affirmation.  He loves that they are affectionate and intimate but says it is becoming more frequent and he needs a little separation from the constancy of it.  She says that when he withdraws it makes her feel less  validated.  We talked about what her own internal validation looks like about who she is personality and gifts wise and encouraged her to say those to herself. Progress of treatment plan 20%  Interventions: Cognitive Behavioral Therapy Diagnosis: Generalized anxiety disorder  Plan: Follow-up appointments as scheduled.  French Ana, [facility]                                              French Ana, [facility]               French Ana, [facility]               French Ana, [facility]

## 2023-04-02 ENCOUNTER — Ambulatory Visit: Payer: Medicare Other | Admitting: Behavioral Health

## 2023-04-09 ENCOUNTER — Ambulatory Visit: Payer: Medicare Other | Admitting: Behavioral Health

## 2023-04-16 ENCOUNTER — Ambulatory Visit: Payer: Medicare Other | Admitting: Behavioral Health

## 2023-04-23 ENCOUNTER — Ambulatory Visit: Payer: Medicare Other | Admitting: Behavioral Health

## 2023-05-28 ENCOUNTER — Ambulatory Visit (INDEPENDENT_AMBULATORY_CARE_PROVIDER_SITE_OTHER): Payer: Medicare Other | Admitting: Behavioral Health

## 2023-05-28 ENCOUNTER — Encounter: Payer: Self-pay | Admitting: Behavioral Health

## 2023-05-28 DIAGNOSIS — F4010 Social phobia, unspecified: Secondary | ICD-10-CM | POA: Diagnosis not present

## 2023-05-28 DIAGNOSIS — F411 Generalized anxiety disorder: Secondary | ICD-10-CM | POA: Diagnosis not present

## 2023-05-28 NOTE — Progress Notes (Signed)
[facility] Counselor/Therapist Progress Note  Patient ID: LUKISHA STRATMAN, MRN: [id number],    Date: 05/28/2023  Time Spent: 58 minutes  spent in person with the patient.  Treatment Type: Individual Therapy  Reported Symptoms: anxiety  Mental Status Exam: Appearance:  Well Groomed     Behavior: Appropriate  Motor: Normal  Speech/Language:  Clear and Coherent  Affect: Appropriate  Mood: anxious  Thought process: normal  Thought content:   WNL  Sensory/Perceptual disturbances:   WNL  Orientation: oriented to person, place, time/date, situation, day of week, month of year, and year  Attention: Good  Concentration: Good  Memory: WNL  Fund of knowledge:  Good  Insight:   Good  Judgment:  Pt. Struggles to make decisions  Impulse Control: Good   Risk Assessment: Danger to Self:  No Self-injurious Behavior: No Danger to Others: No Duty to Warn:no Physical Aggression / Violence:No  Access to Firearms a concern: No  Gang Involvement:No  Subjective: The patient appeared with brighter and less anxious affect today.  She is beginning to see some positive benefits from the new medication.  She is taking it earlier in the morning and says that her anxiety starts to subside earlier and it is less intense.  Coming off the Seroquel she is now sleeping better and less groggy in the morning.  She still says she wakes up a few times throughout the night but goes back to sleep fairly easily.  She also has tried some of the things we talked about including limiting her choices with picking out what to wear.  She allows herself 1 time did not change and says that even though she stuck to that at times she still wrestles with that if she made the right decision but she is using cognitive challenging to benefit her with that part of it.  She is working hard to practice gratitude even in the small things and he is benefiting from that also.  We looked at wrestling with being limited to 1  choice step 2 with a process and how she is challenging that with success.  For homework I encouraged him to take a weekend trip somewhere.  She responded by planning a trip meeting this weekend by jogging across the country to a [address] town in [place] that she and her husband enjoyed where they will stay for about 4 weeks.  They alternate driving making multiple stops on the way out visiting friends in [place].  We talked about how important it was that she was able to make those decisions including how far they drove, where they stayed, how long they stayed with minimal anxiety. We talked about the importance of mindfulness practice different ways to do that.  I did progressive muscle relaxation with her in the office and encourage practice of that..  Also introduced a vagus nerve stimulation coping skill.  Progress of treatment plan 25%  Interventions: Cognitive Behavioral Therapy Diagnosis: Generalized anxiety disorder  Plan: Follow-up appointments scheduled weekly in person.  French Ana, [facility]                            [facility] Counselor/Therapist Progress Note  Patient ID: NUHAMIN LANI, MRN: [id number],    Date: 05/28/2023  Time Spent: 58 minutes, 11:02 AM until 12 PM, spent in person with the patient.  Treatment Type: Individual Therapy  Reported Symptoms: anxiety  Mental Status Exam: Appearance:  Well Groomed     Behavior: Appropriate  Motor: Normal  Speech/Language:  Clear and Coherent  Affect: Appropriate  Mood: anxious  Thought process: normal  Thought content:   WNL  Sensory/Perceptual disturbances:   WNL  Orientation: oriented to person, place, time/date, situation, day of week, month of year, and year  Attention: Good  Concentration: Good  Memory: WNL  Fund of knowledge:  Good  Insight:   Good  Judgment:  Pt. Struggles to make decisions  Impulse Control: Good   Risk Assessment: Danger  to Self:  No Self-injurious Behavior: No Danger to Others: No Duty to Warn:no Physical Aggression / Violence:No  Access to Firearms a concern: No  Gang Involvement:No   Subjective: I had not seen the patient in 2 months because she and her husband took a trip across the country spending time in [place] and [place].  There was some situational anxiety as she went but for the most part she indicated that she managed it well.  She took trazodone if she needed to sleep but said that 1 night she did not need that medication.  She did not have enough for a refill for one of the medications but did okay with that.  She does want to look at referral to another psychiatrist so I will look into availability at the [facility]..  There was some anticipatory anxiety and returning back home but she found that being at home yesterday and returning to some of the things that she enjoys doing such as making mitzvah', reconnecting with her spiritual side including listening to [place] music.  We also began to talk about things that she has done in the past that but bring her joy and bring joy to other people.  She had a therapy dog that she used to take to school send hospitals so we are going to look into where she can get her current dog trained as a therapy dog.  She enjoys working with animals and children so I mentioned an organization called [employer] to look into.  She and her husband have also looked into a group called [employer] which collects food.  She would be willing to be a representative for them to like people about their organization.  We also started talking about her finding her own voice again.  Because of the anxiety especially in public she questioned whether she was single right things or saying it all within the past she has always been very outgoing and spoken for herself.  She appreciates the fact that her husband has been there for her which she felt like she could not  because of anxiety.  We talked about having a conversation with him about even though it is a adjustment.  Allowing her to find her voice again to push past the anxiety. The patient does contract for safety having no thoughts of hurting herself or anyone else. Progress of treatment plan 25%  Interventions: Cognitive Behavioral Therapy Diagnosis: Generalized anxiety disorder  Plan: Follow-up appointments as scheduled.  French Ana, [facility]                                              French Ana, [facility]               French Ana, [facility]  Sabas Sous, [place] Bernadette Gores, [facility]

## 2023-05-29 ENCOUNTER — Encounter: Payer: Self-pay | Admitting: Behavioral Health

## 2023-06-04 ENCOUNTER — Ambulatory Visit: Payer: Medicare Other | Admitting: Behavioral Health

## 2023-06-14 ENCOUNTER — Ambulatory Visit: Payer: Medicare Other | Admitting: Behavioral Health

## 2023-06-27 ENCOUNTER — Ambulatory Visit: Payer: Medicare Other | Admitting: Behavioral Health

## 2023-07-09 ENCOUNTER — Ambulatory Visit: Payer: Medicare Other | Admitting: Behavioral Health

## 2023-07-22 ENCOUNTER — Ambulatory Visit: Payer: Medicare Other | Admitting: Behavioral Health

## 2023-08-02 ENCOUNTER — Ambulatory Visit: Payer: Medicare Other | Admitting: Adult Health

## 2023-08-02 ENCOUNTER — Encounter: Payer: Self-pay | Admitting: Adult Health

## 2023-08-02 VITALS — BP 130/73 | HR 72 | Ht 68.0 in | Wt 168.0 lb

## 2023-08-02 DIAGNOSIS — F331 Major depressive disorder, recurrent, moderate: Secondary | ICD-10-CM

## 2023-08-02 DIAGNOSIS — F411 Generalized anxiety disorder: Secondary | ICD-10-CM

## 2023-08-02 DIAGNOSIS — F4310 Post-traumatic stress disorder, unspecified: Secondary | ICD-10-CM | POA: Diagnosis not present

## 2023-08-02 DIAGNOSIS — G4700 Insomnia, unspecified: Secondary | ICD-10-CM | POA: Diagnosis not present

## 2023-08-02 MED ORDER — TRAZODONE HCL 50 MG PO TABS
50.0000 mg | ORAL_TABLET | Freq: Every evening | ORAL | 0 refills | Status: AC | PRN
Start: 2023-08-02 — End: ?

## 2023-08-02 NOTE — Progress Notes (Signed)
[facility] MD/PA/NP Initial Note  08/02/2023 2:02 PM Alyssa [name]  MRN:  [id number]  Chief Complaint:   HPI:   Patient seen today for initial psychiatric evaluation.   Self referral.  Accompanied by husband.   Describes mood today as "ok". Pleasant. Reports tearfulness. Mood symptoms - reports depression and anxiety. Reports increased anxiety in the morning. Denies irritability.   Denies panic attacks. Reports worry, rumination and over thinking. Reports being indecisiveness. Reports being unsure about what clothes to wear or which coffee cups to use. Worries about getting things right. Going over and over things. Likes her clothes color coded. Wanting things to be organized. Stating "I have always tried to be organized". Reports current mood is lower than she would like it to be. Stating "I would like to be doing better". Reports current medications are Trazadone 50mg  at bedtime and Lorazepam 1mg  every morning. Does not feel like this combination is as helpful as she would like. Would like to consider options for mood stabilization. Varying interest and motivation. Taking medications as prescribed.  Energy levels stable. Active, does not have a regular exercise routine.   Enjoys some usual interests and activities. Married x 34 years. Lives with husband and dog. Reports a sister in the area.   Appetite adequate. Weight stable. Sleeps well most nights when she has Trazadone. Averages 6 to 7 hours - less without Trazadone. Focus and concentration stable. Completing tasks. Managing aspects of household. 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 Denies SI or HI.  Denies AH or VH. Denies self harm. Denies substance use.  Previous medication trials: Lexapro - not helpful, Celexa helpful, Ativan, Trazadone, Seroquel, Risperdal, Abilify, Olanzapine, Depakote.Depakote  Visit Diagnosis:    ICD-10-CM   1. Insomnia, unspecified type  G47.00     2. MDD (major depressive disorder), recurrent episode,  moderate (HCC)  F33.1     3. Generalized anxiety disorder  F41.1     4. PTSD (post-traumatic stress disorder)  F43.10       Past Psychiatric History: Reports 3 psychiatric hospitalizations in 2023. Reports one other admission 3 years ago.  Past Medical History:  Past Medical History:  Diagnosis Date   Anxiety and depression    Arthritis    Cervicogenic headache    Claustrophobia    Glaucoma    Hypertension    Lumbar radiculopathy    Postsurgical hypothyroidism    Spinal stenosis    Thyroid cancer [facility])    s/p thyroidectomy    Past Surgical History:  Procedure Laterality Date   FOOT SURGERY     LAMINOTOMY  04/03/2021   LEFT L3/4 LAMINOTOMY BILATERAL L3/4 MEDIAL FACETECTOMY   LUMBAR LAMINECTOMY  03/03/2018   THYROIDECTOMY  12/2004    Family Psychiatric History:  Family history of mental illness - depression, bipolar.  Family History:  Family History  Problem Relation Age of Onset   Hypertension Sister    Thyroid cancer Brother    Skin cancer Brother    Diabetes Paternal Grandmother    Colon cancer Neg Hx    Breast cancer Neg Hx    Pancreatic cancer Neg Hx    Stomach cancer Neg Hx    Liver cancer Neg Hx    Esophageal cancer Neg Hx     Social History:  Social History   Socioeconomic History   Marital status: Married    Spouse name: Not on file   Number of children: 0   Years of education: Not on file   Highest education  level: Master's degree (e.g., MA, MS, MEng, MEd, MSW, MBA)  Occupational History   Occupation: retired, Chief Financial Officer  Tobacco Use   Smoking status: Never   Smokeless tobacco: Never  Vaping Use   Vaping status: Never Used  Substance and Sexual Activity   Alcohol use: Yes    Comment: Rare   Drug use: Never   Sexual activity: Not Currently  Other Topics Concern   Not on file  Social History Narrative   Moved from [place] June 2022   Lives w/ husband   Moved to stay close to her sister (Mrs Vonita Moss)   Social Determinants of Health    Financial Resource Strain: Low Risk  (01/27/2023)   Received from [facility]   Overall Financial Resource Strain (CARDIA)    Difficulty of Paying Living Expenses: Not hard at all  Food Insecurity: Low Risk  (05/30/2023)   [facility]   Food vital sign    Within the past 12 months, you worried that your food would run out before you got money to buy more: Never true    Within the past 12 months, the food you bought just didn't last and you didn't have money to get more. : Never true  Transportation Needs: No Transportation Needs (05/30/2023)   [facility]   Transportation    In the past 12 months, has lack of reliable transportation kept you from medical appointments, meetings, work or from getting things needed for daily living? : No  Physical Activity: Sufficiently Active (01/27/2023)   [facility]   Exercise Vital Sign    Days of Exercise per Week: 5 days    Minutes of Exercise per Session: 30 min  Stress: No Stress Concern Present (01/27/2023)   Received from [facility] - Occupational Stress Questionnaire    Feeling of Stress : Only a little  Social Connections: Somewhat Isolated (01/27/2023)   Received from [facility]   Social Network    How would you rate your social network (family, work, friends)?: Restricted participation with some degree of social isolation    Allergies:  Allergies  Allergen Reactions   Amoxicillin Hives    Metabolic Disorder Labs: Lab Results  Component Value Date   HGBA1C 5.1 06/07/2022   MPG 100 06/07/2022   No results found for: "PROLACTIN" Lab Results  Component Value Date   CHOL 309 (H) 06/07/2022   TRIG 62 06/07/2022   HDL 75 06/07/2022   CHOLHDL 4.1 06/07/2022   VLDL 12 06/07/2022   LDLCALC 222 (H) 06/07/2022   LDLCALC 125 (H) 11/27/2021   Lab Results  Component Value Date   TSH 26.370 (H) 06/07/2022    TSH 9.150 (H) 11/28/2021    Therapeutic Level Labs: No results found for: "LITHIUM" No results found for: "VALPROATE" No results found for: "CBMZ"  Current Medications: Current Outpatient Medications  Medication Sig Dispense Refill   escitalopram (LEXAPRO) 10 MG tablet Take 1 tablet (10 mg total) by mouth daily. 30 tablet 0   Estradiol 10 MCG TABS vaginal tablet Place 1 tablet (10 mcg total) vaginally 2 (two) times a week. 30 tablet 12   gabapentin (NEURONTIN) 100 MG capsule Take 100 mg by mouth 3 (three) times daily.     levothyroxine (SYNTHROID) 137 MCG tablet Take 137 mcg by mouth daily before breakfast.     LORazepam (ATIVAN) 1 MG tablet Take 1 tablet (1 mg total)  by mouth daily as needed for anxiety. 30 tablet 0   progesterone (PROMETRIUM) 100 MG capsule Take 100-200 mg by mouth at bedtime.     QUEtiapine (SEROQUEL) 100 MG tablet Take 1 tablet (100 mg total) by mouth at bedtime. 30 tablet 0   traZODone (DESYREL) 50 MG tablet Take 50 mg by mouth at bedtime as needed for sleep.     No current facility-administered medications for this visit.    Medication Side Effects: none  Orders placed this visit:  No orders of the defined types were placed in this encounter.   Psychiatric Specialty Exam:  Review of Systems  Musculoskeletal:  Negative for gait problem.  Neurological:  Negative for tremors.  Psychiatric/Behavioral:         Please refer to HPI    There were no vitals taken for this visit.There is no height or weight on file to calculate BMI.  General Appearance: Casual and Neat  Eye Contact:  Good  Speech:  Blocked, Clear and Coherent, and Normal Rate  Volume:  Normal  Mood:  Euthymic  Affect:  Appropriate and Congruent  Thought Process:  Coherent and Descriptions of Associations: Intact  Orientation:  Full (Time, Place, and Person)  Thought Content: Logical   Suicidal Thoughts:  No  Homicidal Thoughts:  No  Memory:  WNL  Judgement:  Good  Insight:  Good   Psychomotor Activity:  Normal  Concentration:  Concentration: Good and Attention Span: Good  Recall:  Fair  Fund of Knowledge: Good  Language: Good  Assets:  Communication Skills Desire for Improvement Financial Resources/Insurance Housing Intimacy Leisure Time Physical Health Resilience Social Support Talents/Skills Transportation Vocational/Educational  ADL's:  Intact  Cognition: WNL  Prognosis:  Good   Screenings:  PHQ2-9    Flowsheet Row Office Visit from 09/07/2022 in [facility] Office Visit from 06/21/2021 in [facility] Office Visit from 06/05/2021 in [facility] at [facility]  PHQ-2 Total Score 2 0 0  PHQ-9 Total Score 7 0 0      Flowsheet Row Office Visit from 09/07/2022 in [facility] at [facility] Most recent reading at 09/07/2022 11:23 AM ED from 08/17/2022 in [facility] at [facility] Most recent reading at 08/17/2022  8:36 PM ED from 08/17/2022 in [facility] Most recent reading at 08/17/2022  5:14 PM  C-SSRS RISK CATEGORY No Risk No Risk No Risk       Receiving Psychotherapy: No   Treatment Plan/Recommendations:   Plan:  PDMP reviewed  Continue: Trazadone 50 to 100mg  at bedtime  Lorazepam 1mg  daily  Will not make further changes until a chart review is complete. Unable to gather necessary collateral from patient or husband - both poor historians. Patient husband reports multiple hospital admissions over the years, but unable to give details. Have advised patient and husband to return in one week to allow for overview. Will send in a prescription for trazadone for patient's insomnia - currently prescribed, but out x 2 days.   [facility] - will requests records.   RTC 1 week  Patient advised to  contact office with any questions, adverse effects, or acute worsening in signs and symptoms.   Discussed potential benefits, risk, and side effects of benzodiazepines to include potential risk of tolerance and dependence, as well as possible drowsiness.  Advised patient not to drive if experiencing drowsiness and to take lowest possible effective dose to minimize risk of dependence and tolerance.    Dorothyann Gibbs, NP

## 2023-08-09 ENCOUNTER — Ambulatory Visit: Payer: Medicare Other | Admitting: Adult Health

## 2023-08-20 ENCOUNTER — Ambulatory Visit: Payer: Medicare Other | Admitting: Physician Assistant

## 2023-09-26 ENCOUNTER — Encounter: Payer: Self-pay | Admitting: Behavioral Health

## 2023-09-26 ENCOUNTER — Ambulatory Visit: Payer: Medicare Other | Admitting: Behavioral Health

## 2023-09-26 DIAGNOSIS — F411 Generalized anxiety disorder: Secondary | ICD-10-CM | POA: Diagnosis not present

## 2023-09-26 DIAGNOSIS — F4010 Social phobia, unspecified: Secondary | ICD-10-CM

## 2023-09-26 NOTE — Progress Notes (Addendum)
[facility] Counselor/Therapist Progress Note  Patient ID: SESLEY DOLLISON, MRN: [id number],    Date: 09/26/2023  Time Spent: 10 AM until 10:55 AM, 55 minutes.  This session was held via video teletherapy. The patient consented to the video teletherapy and was located in her home during this session. She is aware it is the responsibility of the patient to secure confidentiality on her end of the session. The provider was in a private home office for the duration of this session.    Treatment Type: Individual Therapy  Reported Symptoms: anxiety  Mental Status Exam: Appearance:  Well Groomed     Behavior: Appropriate  Motor: Normal  Speech/Language:  Clear and Coherent  Affect: Appropriate  Mood: anxious  Thought process: normal  Thought content:   WNL  Sensory/Perceptual disturbances:   WNL  Orientation: oriented to person, place, time/date, situation, day of week, month of year, and year  Attention: Good  Concentration: Good  Memory: WNL  Fund of knowledge:  Good  Insight:   Good  Judgment:  Pt. Struggles to make decisions  Impulse Control: Good   Risk Assessment: Danger to Self:  No Self-injurious Behavior: No Danger to Others: No Duty to Warn:no Physical Aggression / Violence:No  Access to Firearms a concern: No  Gang Involvement:No   Subjective: I had not seen the patient since June of this year.  She wanted to take a break and see what she could do in terms of working on reducing her anxiety before restarting therapy.  She has put in significant work saying establishing a daily routine has been very beneficial to her.  She listened to multiple podcast about the benefits of structure and routine.  She reports her anxiety is better and that her focus is better.  She feels that she is more present.  A lot of it revolves around relaxation breathing techniques being more positive with herself and using more  positive self-talk and affirmations.  She feels that she is more present.  She feels more comfortable doing things without her husband.  She is very intentional about her morning routine including gratitude walks, walks with her dog, significant time addressing her spiritual life including meditation, Bible reading, prayer time.  She is exploring other opportunities of things to do with her husband to get her out of the house.  She loves being outdoors.  She is looking at things and connect with who she is in a creative way that help her stretch her comfort zone and interact with other people.  She continues to pray that God helps her stretch her comfort zone and we talked about the importance of faith in the healing process.  Moving forward in therapy she would like to continue to work on reducing anxiety but also to use the sessions as coach and direction for overall life purpose.  She does contract for safety having no thoughts of hurting herself or anyone else. Progress of treatment plan 25% the patient agreed to continue the goals as articulated in the initial session but since there was a 20-month Since I had seen the patient we reset the target date for April 08, 2024.  Interventions: Cognitive Behavioral Therapy Diagnosis: Generalized anxiety disorder  Plan: Follow-up appointments as scheduled.  [place], [facility]  [place], [facility]               French Ana, [facility]               French Ana, [facility]               French Ana, [facility]               French Ana, [facility]

## 2023-11-05 ENCOUNTER — Encounter: Payer: Self-pay | Admitting: Behavioral Health

## 2023-11-05 ENCOUNTER — Ambulatory Visit: Payer: Medicare Other | Admitting: Behavioral Health

## 2023-11-05 DIAGNOSIS — F4010 Social phobia, unspecified: Secondary | ICD-10-CM

## 2023-11-05 DIAGNOSIS — F411 Generalized anxiety disorder: Secondary | ICD-10-CM

## 2023-11-05 NOTE — Progress Notes (Addendum)
 [facility] Counselor/Therapist Progress Note  Patient ID: Alyssa [name], MRN: [id number],    Date: 11/05/2023  Time Spent: 1 PM until 1:58 PM, 58 minutes.  This session was held in person at the outpatient therapist office.  Treatment Type: Individual Therapy  Reported Symptoms: anxiety  Mental Status Exam: Appearance:  Well Groomed     Behavior: Appropriate  Motor: Normal  Speech/Language:  Clear and Coherent  Affect: Appropriate  Mood: anxious  Thought process: normal  Thought content:   WNL  Sensory/Perceptual disturbances:   WNL  Orientation: oriented to person, place, time/date, situation, day of week, month of year, and year  Attention: Good  Concentration: Good  Memory: WNL  Fund of knowledge:  Good  Insight:   Good  Judgment:  Pt. Struggles to make decisions  Impulse Control: Good   Risk Assessment: Danger to Self:  No Self-injurious Behavior: No Danger to Others: No Duty to Warn:no Physical Aggression / Violence:No  Access to Firearms a concern: No  Gang Involvement:No   Subjective: The patient and her husband report some increase in anxiety especially in the mornings.  The patient attributed this to her getting out of her some of the routines that she and her husband had gotten accustomed to and she felt were beneficial.  We talked about what it would take to return to that including stretching, walking etc.  We also looked at the patient's anxiety especially in interactions specifically as an example with the neighbors.  We looked at a certain situation in which there were different perceptions of the situation between the patient and her husband.  He wanted to briefly return something to a neighbor but she saw it as a opportunity for interaction with them.  He saw that as a possibility but not as a definite thing and elected to a disagreement.  The patient said she wanted to delay going over there to  give herself more time to prepare for what might be said how she could contribute.  We looked at how the 2 of them interact with other people both individually and as a couple.  The husband has as a rule because of the patient's anxiety spoken for her and both report that is still somewhat of a possibility.  There was also difference in perception as to him feeling that he was allowing an opening for her to speak and maybe not doing it quickly enough versus her wanting to make sure she said the right thing with.  We talked about some different ways to resolve that or see it from a different perspective.  We will continue to work on had a few things from a different perspective as well as continue to improve the way that they communicate with each other especially when in relationship with other people. She does contract for safety having no thoughts of hurting herself or anyone else. Progress of treatment plan 25% the patient agreed to continue the goals as articulated in the initial session but since there was a 24-month Since I had seen the patient we reset the target date for April 08, 2024. 03/17/24 The patient was scheduled for future visits after this session  but did not return for therapy.  Interventions: Cognitive Behavioral Therapy Diagnosis: Generalized anxiety disorder  Plan: Follow-up appointments as scheduled.  French Ana, [facility]  French Ana, [facility]               French Ana, [facility]               French Ana, [facility]               French Ana, [facility]               French Ana, [facility]                           [facility] Counselor/Therapist Progress Note  Patient ID: Alyssa [name], MRN: [id number],    Date: 11/05/2023  Time Spent: 10 AM until 10:55 AM, 55 minutes.  This session was held  via video teletherapy. The patient consented to the video teletherapy and was located in her home during this session. She is aware it is the responsibility of the patient to secure confidentiality on her end of the session. The provider was in a private home office for the duration of this session.    Treatment Type: Individual Therapy  Reported Symptoms: anxiety  Mental Status Exam: Appearance:  Well Groomed     Behavior: Appropriate  Motor: Normal  Speech/Language:  Clear and Coherent  Affect: Appropriate  Mood: anxious  Thought process: normal  Thought content:   WNL  Sensory/Perceptual disturbances:   WNL  Orientation: oriented to person, place, time/date, situation, day of week, month of year, and year  Attention: Good  Concentration: Good  Memory: WNL  Fund of knowledge:  Good  Insight:   Good  Judgment:  Pt. Struggles to make decisions  Impulse Control: Good   Risk Assessment: Danger to Self:  No Self-injurious Behavior: No Danger to Others: No Duty to Warn:no Physical Aggression / Violence:No  Access to Firearms a concern: No  Gang Involvement:No   Subjective: I had not seen the patient since June of this year.  She wanted to take a break and see what she could do in terms of working on reducing her anxiety before restarting therapy.  She has put in significant work saying establishing a daily routine has been very beneficial to her.  She listened to multiple podcast about the benefits of structure and routine.  She reports her anxiety is better and that her focus is better.  She feels that she is more present.  A lot of it revolves around relaxation breathing techniques being more positive with herself and using more positive self-talk and affirmations.  She feels that she is more present.  She feels more comfortable doing things without her husband.  She is very intentional about her morning routine including gratitude walks, walks with her dog, significant time  addressing her spiritual life including meditation, Bible reading, prayer time.  She is exploring other opportunities of things to do with her husband to get her out of the house.  She loves being outdoors.  She is looking at things and connect with who she is in a creative way that help her stretch her comfort zone and interact with other people.  She continues to pray that God helps her stretch her comfort zone and we talked about the importance of faith in the healing process.  Moving forward in therapy she would like to continue to work on reducing anxiety but also to use the sessions as coach and direction for overall life purpose.  She does contract for safety having no  thoughts of hurting herself or anyone else. Progress of treatment plan 25% the patient agreed to continue the goals as articulated in the initial session but since there was a 30-month Since I had seen the patient we reset the target date for April 08, 2024.  Interventions: Cognitive Behavioral Therapy Diagnosis: Generalized anxiety disorder  Plan: Follow-up appointments as scheduled.  French Ana, [facility]                                              French Ana, [facility]               French Ana, [facility]               French Ana, [facility]               French Ana, [facility]               French Ana, [facility]               [place] Ana, [facility]

## 2023-11-12 ENCOUNTER — Ambulatory Visit: Payer: Medicare Other | Admitting: Behavioral Health

## 2023-11-18 ENCOUNTER — Ambulatory Visit: Payer: Medicare Other | Admitting: Behavioral Health

## 2024-02-09 ENCOUNTER — Emergency Department (HOSPITAL_COMMUNITY)
Admission: EM | Admit: 2024-02-09 | Discharge: 2024-02-11 | Disposition: A | Discharge status: Home / Self Care | Attending: Emergency Medicine | Admitting: Emergency Medicine

## 2024-02-09 ENCOUNTER — Other Ambulatory Visit: Payer: Self-pay

## 2024-02-09 ENCOUNTER — Encounter (HOSPITAL_COMMUNITY): Payer: Self-pay | Admitting: Emergency Medicine

## 2024-02-09 DIAGNOSIS — F411 Generalized anxiety disorder: Secondary | ICD-10-CM | POA: Insufficient documentation

## 2024-02-09 DIAGNOSIS — Z85850 Personal history of malignant neoplasm of thyroid: Secondary | ICD-10-CM | POA: Insufficient documentation

## 2024-02-09 DIAGNOSIS — Z79899 Other long term (current) drug therapy: Secondary | ICD-10-CM | POA: Diagnosis not present

## 2024-02-09 DIAGNOSIS — F22 Delusional disorders: Secondary | ICD-10-CM | POA: Insufficient documentation

## 2024-02-09 DIAGNOSIS — F419 Anxiety disorder, unspecified: Secondary | ICD-10-CM | POA: Insufficient documentation

## 2024-02-09 DIAGNOSIS — F329 Major depressive disorder, single episode, unspecified: Secondary | ICD-10-CM | POA: Diagnosis present

## 2024-02-09 DIAGNOSIS — F323 Major depressive disorder, single episode, severe with psychotic features: Secondary | ICD-10-CM | POA: Diagnosis not present

## 2024-02-09 DIAGNOSIS — N390 Urinary tract infection, site not specified: Secondary | ICD-10-CM

## 2024-02-09 DIAGNOSIS — F39 Unspecified mood [affective] disorder: Secondary | ICD-10-CM | POA: Insufficient documentation

## 2024-02-09 DIAGNOSIS — I10 Essential (primary) hypertension: Secondary | ICD-10-CM | POA: Insufficient documentation

## 2024-02-09 DIAGNOSIS — F29 Unspecified psychosis not due to a substance or known physiological condition: Secondary | ICD-10-CM | POA: Insufficient documentation

## 2024-02-09 LAB — URINALYSIS, COMPLETE (UACMP) WITH MICROSCOPIC
Bacteria, UA: NONE SEEN
Bilirubin Urine: NEGATIVE
Glucose, UA: NEGATIVE mg/dL
Hgb urine dipstick: NEGATIVE
Ketones, ur: 5 mg/dL — AB
Nitrite: NEGATIVE
Protein, ur: NEGATIVE mg/dL
Specific Gravity, Urine: 1.027 (ref 1.005–1.030)
pH: 5 (ref 5.0–8.0)

## 2024-02-09 LAB — BASIC METABOLIC PANEL
Anion gap: 11 (ref 5–15)
BUN: 27 mg/dL — ABNORMAL HIGH (ref 8–23)
CO2: 20 mmol/L — ABNORMAL LOW (ref 22–32)
Calcium: 9.1 mg/dL (ref 8.9–10.3)
Chloride: 106 mmol/L (ref 98–111)
Creatinine, Ser: 0.81 mg/dL (ref 0.44–1.00)
GFR, Estimated: >60 mL/min (ref >60)
Glucose, Bld: 89 mg/dL (ref 70–99)
Potassium: 4 mmol/L (ref 3.5–5.1)
Sodium: 137 mmol/L (ref 135–145)

## 2024-02-09 LAB — SALICYLATE LEVEL: Salicylate Lvl: <7 mg/dL — ABNORMAL LOW (ref 7.0–30.0)

## 2024-02-09 LAB — CBC
HCT: 42.4 % (ref 36.0–46.0)
Hemoglobin: 13.7 g/dL (ref 12.0–15.0)
MCH: 29.2 pg (ref 26.0–34.0)
MCHC: 32.3 g/dL (ref 30.0–36.0)
MCV: 90.4 fL (ref 80.0–100.0)
Platelets: 282 10*3/uL (ref 150–400)
RBC: 4.69 MIL/uL (ref 3.87–5.11)
RDW: 13 % (ref 11.5–15.5)
WBC: 7.3 10*3/uL (ref 4.0–10.5)
nRBC: 0 % (ref 0.0–0.2)

## 2024-02-09 LAB — RAPID URINE DRUG SCREEN, HOSP PERFORMED
Amphetamines: NOT DETECTED
Barbiturates: NOT DETECTED
Benzodiazepines: NOT DETECTED
Cocaine: NOT DETECTED
Opiates: NOT DETECTED
Tetrahydrocannabinol: NOT DETECTED

## 2024-02-09 LAB — ACETAMINOPHEN LEVEL: Acetaminophen (Tylenol), Serum: <10 ug/mL — ABNORMAL LOW (ref 10–30)

## 2024-02-09 LAB — ETHANOL: Alcohol, Ethyl (B): <10 mg/dL (ref <10)

## 2024-02-09 MED ORDER — QUETIAPINE FUMARATE 100 MG PO TABS
100.0000 mg | ORAL_TABLET | Freq: Every day | ORAL | Status: DC
  Administered 2024-02-09: 100 mg via ORAL
  Filled 2024-02-09: qty 1

## 2024-02-09 MED ORDER — ESCITALOPRAM OXALATE 10 MG PO TABS: 20.0000 mg | ORAL_TABLET | Freq: Every day | ORAL | Status: DC

## 2024-02-09 MED ORDER — DIPHENHYDRAMINE HCL 50 MG/ML IJ SOLN
25.0000 mg | Freq: Once | INTRAMUSCULAR | Status: DC
  Filled 2024-02-09: qty 1

## 2024-02-09 MED ORDER — ESCITALOPRAM OXALATE 10 MG PO TABS: 10.0000 mg | ORAL_TABLET | Freq: Every day | ORAL | Status: DC

## 2024-02-09 MED ORDER — DIPHENHYDRAMINE HCL 25 MG PO CAPS
25.0000 mg | ORAL_CAPSULE | Freq: Once | ORAL | Status: AC
  Administered 2024-02-09: 25 mg via ORAL
  Filled 2024-02-09: qty 1

## 2024-02-09 MED ORDER — PALIPERIDONE ER 3 MG PO TB24
3.0000 mg | ORAL_TABLET | Freq: Every day | ORAL | Status: DC
  Administered 2024-02-10 – 2024-02-11 (×2): 3 mg via ORAL
  Filled 2024-02-09 (×3): qty 1

## 2024-02-09 MED ORDER — LORAZEPAM 1 MG PO TABS
1.0000 mg | ORAL_TABLET | Freq: Every day | ORAL | Status: DC | PRN
  Administered 2024-02-10: 1 mg via ORAL
  Filled 2024-02-09: qty 1

## 2024-02-09 MED ORDER — TRAZODONE HCL 50 MG PO TABS: 50.0000 mg | ORAL_TABLET | Freq: Every evening | ORAL | Status: DC | PRN

## 2024-02-09 MED ORDER — CEPHALEXIN 500 MG PO CAPS
500.0000 mg | ORAL_CAPSULE | Freq: Three times a day (TID) | ORAL | Status: DC
  Administered 2024-02-10 – 2024-02-11 (×5): 500 mg via ORAL
  Filled 2024-02-09 (×5): qty 1

## 2024-02-09 MED ORDER — GABAPENTIN 100 MG PO CAPS
100.0000 mg | ORAL_CAPSULE | Freq: Three times a day (TID) | ORAL | Status: DC
  Administered 2024-02-09: 100 mg via ORAL
  Filled 2024-02-09: qty 1

## 2024-02-09 MED ORDER — HALOPERIDOL LACTATE 5 MG/ML IJ SOLN
5.0000 mg | Freq: Once | INTRAMUSCULAR | Status: AC
  Administered 2024-02-09: 5 mg via INTRAMUSCULAR
  Filled 2024-02-09: qty 1

## 2024-02-09 MED ORDER — LEVOTHYROXINE SODIUM 137 MCG PO TABS
137.0000 ug | ORAL_TABLET | Freq: Every day | ORAL | Status: DC
  Administered 2024-02-10 – 2024-02-11 (×2): 137 ug via ORAL
  Filled 2024-02-09 (×2): qty 1

## 2024-02-09 MED ORDER — LORAZEPAM 1 MG PO TABS
2.0000 mg | ORAL_TABLET | Freq: Every day | ORAL | Status: DC
  Administered 2024-02-09 – 2024-02-10 (×2): 2 mg via ORAL
  Filled 2024-02-09 (×2): qty 2

## 2024-02-09 MED ORDER — CLONIDINE HCL 0.1 MG PO TABS
0.2000 mg | ORAL_TABLET | Freq: Every day | ORAL | Status: DC
  Administered 2024-02-10 – 2024-02-11 (×2): 0.2 mg via ORAL
  Filled 2024-02-09 (×2): qty 2

## 2024-02-09 MED ORDER — LORAZEPAM 1 MG PO TABS
1.0000 mg | ORAL_TABLET | Freq: Once | ORAL | Status: AC
  Administered 2024-02-09: 1 mg via ORAL
  Filled 2024-02-09: qty 1

## 2024-02-09 MED ORDER — LORAZEPAM 1 MG PO TABS: 1.0000 mg | ORAL_TABLET | Freq: Two times a day (BID) | ORAL | Status: DC

## 2024-02-09 NOTE — ED Provider Notes (Signed)
Paranoia since last night, not taking her meds.  She is IVCed, will perform medical clearance and TTS management.    UA with finding suggestive of UTI.  Will treat with Keflex  BP (!) 106/57 (BP Location: Right Arm)   Pulse 66   Temp 97.9 F (36.6 C) (Oral)   Resp 13   Ht 5\' 8"  (1.727 m)   Wt 65.8 kg   SpO2 99%   BMI 22.05 kg/m   Results for orders placed or performed during the hospital encounter of 02/09/24  CBC   Collection Time: 02/09/24  6:57 PM  Result Value Ref Range   WBC 7.3 4.0 - 10.5 K/uL   RBC 4.69 3.87 - 5.11 MIL/uL   Hemoglobin 13.7 12.0 - 15.0 g/dL   HCT 09.8 11.9 - 14.7 %   MCV 90.4 80.0 - 100.0 fL   MCH 29.2 26.0 - 34.0 pg   MCHC 32.3 30.0 - 36.0 g/dL   RDW 82.9 56.2 - 13.0 %   Platelets 282 150 - 400 K/uL   nRBC 0.0 0.0 - 0.2 %  Basic metabolic panel   Collection Time: 02/09/24  6:57 PM  Result Value Ref Range   Sodium 137 135 - 145 mmol/L   Potassium 4.0 3.5 - 5.1 mmol/L   Chloride 106 98 - 111 mmol/L   CO2 20 (L) 22 - 32 mmol/L   Glucose, Bld 89 70 - 99 mg/dL   BUN 27 (H) 8 - 23 mg/dL   Creatinine, Ser 8.65 0.44 - 1.00 mg/dL   Calcium 9.1 8.9 - 78.4 mg/dL   GFR, Estimated >69 >62 mL/min   Anion gap 11 5 - 15  Salicylate level   Collection Time: 02/09/24  6:57 PM  Result Value Ref Range   Salicylate Lvl <7.0 (L) 7.0 - 30.0 mg/dL  Acetaminophen level   Collection Time: 02/09/24  6:57 PM  Result Value Ref Range   Acetaminophen (Tylenol), Serum <10 (L) 10 - 30 ug/mL  Ethanol   Collection Time: 02/09/24  6:57 PM  Result Value Ref Range   Alcohol, Ethyl (B) <10 <10 mg/dL  Rapid urine drug screen (hospital performed)   Collection Time: 02/09/24  9:34 PM  Result Value Ref Range   Opiates NONE DETECTED NONE DETECTED   Cocaine NONE DETECTED NONE DETECTED   Benzodiazepines NONE DETECTED NONE DETECTED   Amphetamines NONE DETECTED NONE DETECTED   Tetrahydrocannabinol NONE DETECTED NONE DETECTED   Barbiturates NONE DETECTED NONE DETECTED   Urinalysis, Complete w Microscopic -Urine, Clean Catch   Collection Time: 02/09/24  9:34 PM  Result Value Ref Range   Color, Urine YELLOW YELLOW   APPearance CLOUDY (A) CLEAR   Specific Gravity, Urine 1.027 1.005 - 1.030   pH 5.0 5.0 - 8.0   Glucose, UA NEGATIVE NEGATIVE mg/dL   Hgb urine dipstick NEGATIVE NEGATIVE   Bilirubin Urine NEGATIVE NEGATIVE   Ketones, ur 5 (A) NEGATIVE mg/dL   Protein, ur NEGATIVE NEGATIVE mg/dL   Nitrite NEGATIVE NEGATIVE   Leukocytes,Ua MODERATE (A) NEGATIVE   RBC / HPF 0-5 0 - 5 RBC/hpf   WBC, UA 21-50 0 - 5 WBC/hpf   Bacteria, UA NONE SEEN NONE SEEN   Squamous Epithelial / HPF 0-5 0 - 5 /HPF   Mucus PRESENT    Hyaline Casts, UA PRESENT    Uric Acid Crys, UA PRESENT    No results found.    Fayrene Helper, PA-C 02/09/24 [id number]    Charlynne Pander, MD 02/10/24  [id number]  

## 2024-02-09 NOTE — ED Notes (Signed)
 Phone, jacket, clothes and shoes were placed in patient belongings cabinet for room 22

## 2024-02-09 NOTE — Consult Note (Signed)
 [facility] Psychiatric Consult Initial  Patient Name: .Alyssa [name]  MRN: [id number]  DOB: 06-21-1951  Consult Order details:  Orders (From admission, onward)     Start     Ordered   02/09/24 1656  CONSULT TO CALL ACT TEAM       Ordering Provider: Al Decant, PA-C  Provider:  (Not yet assigned)  Question:  Reason for Consult?  Answer:  Psych consult   02/09/24 1656             Mode of Visit: In person    Psychiatry Consult Evaluation  Service Date: February 09, 2024 LOS:  LOS: 0 days  Chief Complaint altered mental status  Primary Psychiatric Diagnoses  MDD 2.   Psychosis 3.   GAD  Assessment  Alyssa [name] is a 73 y.o. female admitted: Presented to the ED on 02/09/2024  4:52 PM for having episodes non stop hours of pacing, fidgeting with hands, saying words that do not make sense, being paranoid of colors and numbers. She carries the psychiatric diagnoses of MDD, GAD, psychosis, mood disorder and has a past medical history of multifocal papillary thyroid cancer s/p XRT and surg 2006, post-op hypothyroidism, HTN, L lumbar radiculopathy, SI joint disease, chronic allergic conjunctivitis, claustrophobia, and glaucoma .   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 She meets criteria for inpatient psychiatric admission. Current outpatient psychotropic medications include Invega, Ativan, Clonidine and historically she has had a negative response to these medications. She was non compliant with medications prior to admission as evidenced by patient spouse. On initial examination, patient is calm, pleasant and cooperative. Please see plan below for detailed recommendations.   Diagnoses:  Active Hospital problems: Principal Problem:   Major depressive disorder with psychotic features (HCC) Active Problems:   Mood disorder (HCC)   GAD (generalized anxiety  disorder)    Plan   ## Psychiatric Medication Recommendations:  Invega 3mg  qday  Ativan 1qam prn and 2mg  at bedtime  Clonidine 0.2mg  tab 2 tabs in am   ## Medical Decision Making Capacity: Not specifically addressed in this encounter  ## Further Work-up:  -- Ordered urinalysis to rule out UTI EKG or U/A -- Ordered an EKG on 02/09/24 -- Pertinent labwork reviewed earlier this admission includes: CBC, TSH, CMP, EKG, UA, UDS   ## Disposition:-- We recommend inpatient psychiatric hospitalization. Patient has been involuntarily committed on 02/09/24.   ## Behavioral / Environmental: -To minimize splitting of staff, assign one staff person to communicate all information from the team when feasible. or Utilize compassion and acknowledge the patient's experiences while setting clear and realistic expectations for care.    ## Safety and Observation Level:  - Based on my clinical evaluation, I estimate the patient to be at low risk of self harm in the current setting. - At this time, we recommend  routine. This decision is based on my review of the chart including patient's history and current presentation, interview of the patient, mental status examination, and consideration of suicide risk including evaluating suicidal ideation, plan, intent, suicidal or self-harm behaviors, risk factors, and protective factors. This judgment is based on our ability to directly address suicide risk, implement suicide prevention strategies, and develop a safety plan while the patient is in the clinical setting. Please contact our team if there is a concern that risk level has changed.  CSSR Risk Category:C-SSRS RISK CATEGORY: No Risk  Suicide Risk  Assessment: Patient has following modifiable risk factors for suicide: under treated depression  and recklessness, which we are addressing by recommending inpatient psychiatric admission. Patient has following non-modifiable or demographic risk factors for suicide:  psychiatric hospitalization Patient has the following protective factors against suicide: Access to outpatient mental health care and Supportive family  Thank you for this consult request. Recommendations have been communicated to the primary team.  We will recommend inpatient psychiatric admission at this time.   Alyssa [name], Alyssa [name]       History of Present Illness  Relevant Aspects of Hospital ED Course:  Admitted on 02/09/2024 for for having episodes non stop hours of pacing, fidgeting with hands, saying words that do not make sense, being paranoid of colors and numbers.  Patient Report:  Alyssa [name], 73 y.o., female patient seen face to face by this provider, consulted with Dr. Clovis Riley; and chart reviewed on 02/09/24.  On evaluation Alyssa [name] patient is accompanied by her husband Alyssa [name] and sister Alyssa [name], who patient wanted to stay in the room during evaluation. On evaluation Alyssa [name] is alert and oriented x4; pt is initially guarded about answering questions, but is able to speak when husband embraces her pt is less guarded and more willing to answer questions. When asked why she's in the hospital, she reports she's been dealing with depression and anxiety for most of her life. Husb notes onset of mental health concerns age 49. Describes period of 10 yrs of intense mania episodes. Episodes of elevated energy, hyperspending, dissociation from mundane world, decreased need for sleep, rapid speaking; symptoms would last a few weeks. Maxed out credit cards. Another episode spent $3000 insurance settlement. Some other episodes of increased spending also that would occur with elevated mood, rapid speaking, elevated energy, not sleeping much.  Fhx bipolar in mom who was in hospitals for a large portion of pt's life. Pt notes that has always been fearful that would develop symptoms like mom and has never wanted to have mania.  Pt notes that in last few years, having  more anxiety. Now is not sleeping well for past ~4-5 months; falls asleep fine but difficulty staying asleep. Suddenly awakens usually around 3am; sometimes nightmares about no specific topics but typically is more that awakens b/c has a lot of regrets about things. Wants to sleep but can't sleep. Sleeping 4-5 hrs nightly. Denies past and present AVH. Denies paranoia. Denies feeling that others out to get her or hurt her. Denies feeling that others send or receive messages to pt.  Pt notes that will lay in bed thinking from 3-4am, ruminating on wishing had done things differently (ie. Shouldn't have moved here, should have moved to [place] instead of [place] then wonders why didn't do that then ruminates on that for a while then moves on to "I wonder why she said that 4-5 yrs ago" then can't stop thinking about that). Doesn't like this ruminating; wishes it would stop. Then gets up out of bed, coffee and bfast then starts in pacing and repetitive thoughts ie. Regretting decisions and ruminating on that, self blame, lots of "I should have done that and things would be different" Pt notes that Ativan helps to calm the thoughts but also makes her feel apathetic that any decision that makes is fine.  Ativan 0.5mg  helps; 1mg  makes feel really apathetic about decisions then later regrets the decisions that made when was on Ativan.  Pt notes that the morning constant thoughts doesn't last  long and it's not that bad. Pt notes that some depression but anxiety is the most predominant. Some enjoyment of some things but hesitant to engage in activities b/c worries about what to say to other people, what if choose the wrong thing. Doesn't do things that used to like to do b/c is anxious about them. Denies SI. Denies plans or intent to harm self.   Psych ROS:  Depression: Positive Anxiety:  Positive Mania (lifetime and current): Lifetime  Psychosis: (lifetime and current): Lifetime   Collateral information:  Patient  husband and sister were in the room with her   Review of Systems  Psychiatric/Behavioral:  Positive for depression. The patient is nervous/anxious.      Psychiatric and Social History  Psychiatric History:  Information collected from patient and husband   Prev Dx/Sx: MDD, Anxiety  Current Psych Provider: Dr. Scheryl Marten Home Meds (current): see above Previous Med Trials: Yes  Therapy: Has an appointment set up  Prior Psych Hospitalization: Yes  Prior Self Harm: Denies  Prior Violence:  Denies   Family Psych History: Yes Family Hx suicide: Yes  Social History:  Developmental Hx: Age appropriate  Educational Hx: Graduated high school Occupational Hx: Retired Armed forces operational officer Hx: Denies  Living Situation: Lives with spouse  Spiritual Hx:  Yes Access to weapons/lethal means: Denies    Substance History Alcohol: Yes  Type of alcohol beer, wine Last Drink this week Number of drinks per day beer 1x week/ wine 2-3x week History of alcohol withdrawal seizures Denies  History of DT's Denies  Tobacco: Denies  Illicit drugs: Denies  Prescription drug abuse: Denies  Rehab hx: Denies   Exam Findings  Physical Exam:  Vital Signs:  Weight:  [65.8 kg] 65.8 kg (03/02 1750) Height 5\' 8"  (1.727 m), weight 65.8 kg. Body mass index is 22.05 kg/m.  Physical Exam Vitals and nursing note reviewed. Exam conducted with a chaperone present.  Neurological:     Mental Status: She is alert.  Psychiatric:        Attention and Perception: Attention normal.        Mood and Affect: Mood normal.        Speech: Speech normal.        Behavior: Behavior is cooperative.        Thought Content: Thought content is paranoid.        Cognition and Memory: Memory normal.     Mental Status Exam: General Appearance: Casual  Orientation:  Full (Time, Place, and Person)  Memory:  Immediate;   Fair Remote;   Fair  Concentration:  Concentration: Fair and Attention Span: Fair  Recall:  Good  Attention   Fair  Eye Contact:  Good  Speech:  Clear and Coherent  Language:  Good  Volume:  Normal  Mood: sad  Affect:  Congruent  Thought Process:  Coherent  Thought Content:  WDL  Suicidal Thoughts:  No  Homicidal Thoughts:  No  Judgement:  Fair  Insight:  Good  Psychomotor Activity:  Normal  Akathisia:  No  Fund of Knowledge:  Fair      Assets:  Manufacturing systems engineer Desire for Improvement Financial Resources/Insurance Housing Social Support  Cognition:  WNL  ADL's:  Intact  AIMS (if indicated):        Other History   These have been pulled in through the EMR, reviewed, and updated if appropriate.  Family History:  The patient's family history includes Diabetes in her paternal grandmother; Hypertension in her sister; Skin  cancer in her brother; Thyroid cancer in her brother.  Medical History: Past Medical History:  Diagnosis Date   Anxiety and depression    Arthritis    Cervicogenic headache    Claustrophobia    Glaucoma    Hypertension    Lumbar radiculopathy    Postsurgical hypothyroidism    Spinal stenosis    Thyroid cancer [facility])    s/p thyroidectomy    Surgical History: Past Surgical History:  Procedure Laterality Date   FOOT SURGERY     LAMINOTOMY  04/03/2021   LEFT L3/4 LAMINOTOMY BILATERAL L3/4 MEDIAL FACETECTOMY   LUMBAR LAMINECTOMY  03/03/2018   THYROIDECTOMY  12/2004     Medications:  No current facility-administered medications for this encounter.  Current Outpatient Medications:    escitalopram (LEXAPRO) 10 MG tablet, Take 1 tablet (10 mg total) by mouth daily., Disp: 30 tablet, Rfl: 0   Estradiol 10 MCG TABS vaginal tablet, Place 1 tablet (10 mcg total) vaginally 2 (two) times a week., Disp: 30 tablet, Rfl: 12   gabapentin (NEURONTIN) 100 MG capsule, Take 100 mg by mouth 3 (three) times daily., Disp: , Rfl:    levothyroxine (SYNTHROID) 137 MCG tablet, Take 137 mcg by mouth daily before breakfast., Disp: , Rfl:    LORazepam (ATIVAN) 1 MG tablet,  Take 1 tablet (1 mg total) by mouth daily as needed for anxiety., Disp: 30 tablet, Rfl: 0   progesterone (PROMETRIUM) 100 MG capsule, Take 100-200 mg by mouth at bedtime., Disp: , Rfl:    QUEtiapine (SEROQUEL) 100 MG tablet, Take 1 tablet (100 mg total) by mouth at bedtime., Disp: 30 tablet, Rfl: 0   traZODone (DESYREL) 50 MG tablet, Take 1 tablet (50 mg total) by mouth at bedtime as needed for sleep., Disp: 30 tablet, Rfl: 0  Allergies: Allergies  Allergen Reactions   Amoxicillin Hives and Other (See Comments)   Clavulanic Acid Other (See Comments)    Mava Suares [name], Alyssa [name]

## 2024-02-09 NOTE — ED Provider Notes (Signed)
 [place] EMERGENCY DEPARTMENT AT [facility] Provider Note   CSN: [id number] Arrival date & time: 02/09/24  1649     History  Chief Complaint  Patient presents with   Psychiatric Evaluation    IVIONA HOLE is a 73 y.o. female with medical history of anxiety, depression, claustrophobia, hypertension, thyroid cancer, paranoid delusions, MDD.  Patient presents to ED with her husband for psychiatric evaluation.  Per patient husband, patient has been having progressively worsening mental status for the last 24 hours.  Apparently she recently had medications changed but she is not compliant on medications.  On my exam, the patient is very anxious.  Initially, she refused to come to the department.  She was noted and found to be wandering in the ED parking lot.  IVC paperwork was filled out the patient eventually did come into the department.  We have medicated her with Ativan and Haldol.  Will assess labs and disposition for psychiatric treatment.  She denies SI, HI, AVH.    HPI     Home Medications Prior to Admission medications   Medication Sig Start Date End Date Taking? Authorizing Provider  escitalopram (LEXAPRO) 10 MG tablet Take 1 tablet (10 mg total) by mouth daily. 09/07/22   Thresa Ross, MD  Estradiol 10 MCG TABS vaginal tablet Place 1 tablet (10 mcg total) vaginally 2 (two) times a week. 12/13/22   Reva Bores, MD  gabapentin (NEURONTIN) 100 MG capsule Take 100 mg by mouth 3 (three) times daily. 09/20/22   [provider]  levothyroxine (SYNTHROID) 137 MCG tablet Take 137 mcg by mouth daily before breakfast. 10/23/22   [provider]  LORazepam (ATIVAN) 1 MG tablet Take 1 tablet (1 mg total) by mouth daily as needed for anxiety. 09/07/22   Thresa Ross, MD  progesterone (PROMETRIUM) 100 MG capsule Take 100-200 mg by mouth at bedtime. 11/17/22   [provider]  QUEtiapine (SEROQUEL) 100 MG tablet Take 1 tablet (100 mg total) by mouth  at bedtime. 09/07/22   Thresa Ross, MD  traZODone (DESYREL) 50 MG tablet Take 1 tablet (50 mg total) by mouth at bedtime as needed for sleep. 08/02/23   Mozingo, Thereasa Solo, NP  buPROPion (WELLBUTRIN XL) 150 MG 24 hr tablet Take 150 mg by mouth every morning. 07/13/22 09/07/22  [provider]  OLANZapine zydis (ZYPREXA) 5 MG disintegrating tablet Take 0.5 tablets (2.5 mg total) by mouth 2 (two) times daily. Patient not taking: Reported on 08/18/2022 06/07/22 09/07/22  Princess Bruins, DO      Allergies    Amoxicillin and Clavulanic acid    Review of Systems   Review of Systems  Unable to perform ROS: Psychiatric disorder (Level 5 caveat)    Physical Exam Updated Vital Signs Ht 5\' 8"  (1.727 m)   Wt 65.8 kg   BMI 22.05 kg/m  Physical Exam Vitals and nursing note reviewed.  Constitutional:      General: She is not in acute distress.    Appearance: She is well-developed.  HENT:     Head: Normocephalic and atraumatic.  Eyes:     Conjunctiva/sclera: Conjunctivae normal.  Cardiovascular:     Rate and Rhythm: Normal rate and regular rhythm.     Heart sounds: No murmur heard. Pulmonary:     Effort: Pulmonary effort is normal. No respiratory distress.     Breath sounds: Normal breath sounds.  Abdominal:     Palpations: Abdomen is soft.     Tenderness: There  is no abdominal tenderness.  Musculoskeletal:        General: No swelling.     Cervical back: Neck supple.  Skin:    General: Skin is warm and dry.     Capillary Refill: Capillary refill takes less than 2 seconds.  Neurological:     Mental Status: She is alert. Mental status is at baseline.  Psychiatric:        Mood and Affect: Mood is anxious. Affect is labile.        Speech: Speech is rapid and pressured.        Behavior: Behavior is uncooperative.        Thought Content: Thought content is paranoid. Thought content does not include homicidal or suicidal plan.     ED Results / Procedures / Treatments    Labs (all labs ordered are listed, but only abnormal results are displayed) Labs Reviewed  CBC  BASIC METABOLIC PANEL  SALICYLATE LEVEL  ACETAMINOPHEN LEVEL  ETHANOL  RAPID URINE DRUG SCREEN, HOSP PERFORMED    EKG None  Radiology No results found.  Procedures Procedures   Medications Ordered in ED Medications  haloperidol lactate (HALDOL) injection 5 mg (5 mg Intramuscular Given 02/09/24 1737)  LORazepam (ATIVAN) tablet 1 mg (1 mg Oral Given 02/09/24 1731)  diphenhydrAMINE (BENADRYL) capsule 25 mg (25 mg Oral Given 02/09/24 1821)    ED Course/ Medical Decision Making/ A&P Clinical Course as of 02/09/24 1821  Sun Feb 09, 2024  1730 73 yo female with extensive psychiatric hospitalization history, MDD, presenting to ED in company of her husband with erratic behavior.  Hx of psychosis. Patient with rapid pressured speech, cannot answer questions appropriately, repeatedly attempting to wander into the road or leave the department.  IVC filed for her safety and for psychosis concerns.    [MT]    Clinical Course User Index [MT] Trifan, Kermit Balo, MD   Medical Decision Making Amount and/or Complexity of Data Reviewed Labs: ordered.  Risk Prescription drug management.   73 year old female presents for evaluation.  Please see HPI for further details.  On exam patient is afebrile and nontachycardic.  Lung sounds are clear bilaterally, not hypoxic.  Abdomen soft and compressible throughout.  Neurological examination is at baseline.  The patient is extremely anxious, has rapid pressured speech.  She denies SI, HI or AVH.  IVC paperwork filled out.  Patient treated with Haldol, Ativan.  Will collect patient labs and medically clear for psychiatric disposition.  At the end of my shift, patient workup not complete.  Signed out to [employer].  Plan of management discussed.   Final Clinical Impression(s) / ED Diagnoses Final diagnoses:  Psychosis, unspecified psychosis type [facility])     Rx / DC Orders ED Discharge Orders     None         Clent Ridges 02/09/24 1821    Terald Sleeper, MD 02/09/24 2037

## 2024-02-09 NOTE — ED Triage Notes (Signed)
"  She has been having episodes non stop hours of pacing, fidgeting with hands, saying words that do not make sense, being paranoid of colors and numbers. Last week when it was very cold she left the house with no coat, no phone, no wallet and walked down the road and I had to call the police to get her. She needs her medications adjusted and is refusing to get help" per spouse On arrival patient pacing, not making sense with words. Walked out of ED to parking lot in front of cars. Spouse had to call police to help him in parking lot because he was scared for her safety.

## 2024-02-10 DIAGNOSIS — F329 Major depressive disorder, single episode, unspecified: Secondary | ICD-10-CM | POA: Diagnosis not present

## 2024-02-10 NOTE — ED Notes (Signed)
 Patient report was called to Burnis Medin, RN at [facility]

## 2024-02-10 NOTE — ED Notes (Addendum)
 After calling report, I spoke with personnel from the sheriff's department. They are unable to transport her today-advised she is on the list for tomorrow morning

## 2024-02-10 NOTE — Progress Notes (Signed)
 Pt has been accepted to [facility] TODAY 02/10/2024 . Bed assignment: Main campus  Pt meets inpatient criteria per: Alona Bene, PMHNP   Attending Physician will be Loni Beckwith, MD  Report can be called to: [phone] (this is a pager, please leave call-back number when giving report)  Pt can arrive ASAP   Care Team Notified: Dahlia Byes NP, Jacquelynn Cree NT, Latricia Heft paramedic  [place] Torri Langston LCSW-A   02/10/2024 2:43 PM

## 2024-02-10 NOTE — Consult Note (Signed)
 Patient has been accepted at [facility] [place] and was supposed to go there today.  Due to transportation issue patient will be transported tomorrow Morning.  Patient has been tearful all day wanting to go home.  Provider took time to explain to patient and husband the benefit for inpatient Psychiatry hospitalization and care.  Both verbalizes understanding.  Patient will transported in am.

## 2024-02-10 NOTE — Progress Notes (Signed)
 LCSW Progress Note  [id number]   STEFANIE HODGENS  02/10/2024  2:23 PM  Description:   Inpatient Psychiatric Referral  Patient was recommended inpatient per [facility], PMHNP . There are no available beds at [facility], per [facility] Rona Ravens RN. Patient was referred to the following out of network facilities:    Destination  Service Provider Request Status Services Address Phone Fax Patient Preferred  [facility] Pending - Request Sent -- [address], [place] [address] [phone] --  [facility] Pending - Request Sent -- [address] [place] [address] [phone] --  [facility] Pending - Request Sent -- [address] [place] [address] [phone] --  [facility] Pending - Request Sent -- [address] [place] [address] [phone] --  [facility] Pending - Request Sent -- [address], [place] [address] [phone] --  [facility] Pending - Request Sent -- [address] [place] [address] [phone] --  [facility] Pending - Request Sent -- [address] [place] [phone] --  [facility] Pending - Request Sent -- [address], [place] [address] [phone] --  [facility] Pending - Request Sent -- [address] [place] [address] [phone]       Situation ongoing, CSW to continue following and update chart as more information becomes available.      [place] Jarelyn Bambach, MSW, LCSW  02/10/2024 2:23 PM

## 2024-02-10 NOTE — ED Provider Notes (Signed)
 Emergency Medicine Observation Re-evaluation Note  Alyssa [name] is a 73 y.o. female, seen on rounds today.  Pt initially presented to the ED for complaints of Psychiatric Evaluation Has history of major depressive disorder with psychotic features.  She presented with symptoms of psychosis.  Was also found to have a urinary tract infection and is receiving Keflex.  Physical Exam  BP (!) 111/56   Pulse (!) 48   Temp (!) 97.5 F (36.4 C) (Oral)   Resp 14   Ht 5\' 8"  (1.727 m)   Wt 65.8 kg   SpO2 100%   BMI 22.05 kg/m  Physical Exam General: NAD Cardiac: RR Lungs: even unlabored Psych: calm, cooperative  ED Course / MDM  EKG:EKG Interpretation Date/Time:  Sunday February 09 2024 19:30:40 EST Ventricular Rate:  68 PR Interval:  139 QRS Duration:  101 QT Interval:  403 QTC Calculation: 429 R Axis:   58  Text Interpretation: Sinus rhythm Borderline T wave abnormalities No significant change since last tracing Confirmed by Richardean Canal [id number]) on 02/09/2024 7:39:00 PM  I have reviewed the labs performed to date as well as medications administered while in observation.  Recent changes in the last 24 hours include none.  Plan  Current plan is for awaiting psychiatric disposition. Continue treatment of  UTI with keflex.    Alvira Monday, MD 02/10/24 2132

## 2024-02-11 DIAGNOSIS — F329 Major depressive disorder, single episode, unspecified: Secondary | ICD-10-CM | POA: Diagnosis not present

## 2024-02-11 NOTE — ED Provider Notes (Signed)
 Emergency Medicine Observation Re-evaluation Note  Alyssa [name] is a 73 y.o. female, seen on rounds today.  Pt initially presented to the ED for complaints of Psychiatric Evaluation Currently, the patient is calm and cooperative.  Physical Exam  BP (!) 130/58 (BP Location: Right Arm)   Pulse 61   Temp 97.9 F (36.6 C) (Oral)   Resp 16   Ht 5\' 8"  (1.727 m)   Wt 65.8 kg   SpO2 100%   BMI 22.05 kg/m  Physical Exam General: Awake. Alert. No acute distress Cardiac: Regular rate rhythm Lungs: Clear to auscultation bilaterally Psych: Calm and cooperative  ED Course / MDM  EKG:EKG Interpretation Date/Time:  Sunday February 09 2024 19:30:40 EST Ventricular Rate:  68 PR Interval:  139 QRS Duration:  101 QT Interval:  403 QTC Calculation: 429 R Axis:   58  Text Interpretation: Sinus rhythm Borderline T wave abnormalities No significant change since last tracing Confirmed by Richardean Canal [phone]) on 02/09/2024 7:39:00 PM  I have reviewed the labs performed to date as well as medications administered while in observation.  Recent changes in the last 24 hours include no acute events.  Plan  Current plan is for Inpatient psychiatric care at [facility].  We will arrange for safe transport.  Accepting physician is Dr. Isla Pence, Tyrone Apple, DO 02/11/24 [phone]

## 2024-02-11 NOTE — Discharge Instructions (Addendum)
 You will be transported directly to [facility] for further care

## 2024-03-27 NOTE — Telephone Encounter (Signed)
 Copied from CRM #[id number]. Topic: Clinical Concerns - Medical Question >> Mar 27, 2024 12:30 PM Alyssa Alyssa wrote: Alyssa, Alyssa called with symptoms or general medical question.Patient was seen for symptoms recently and they are worse or different. Message sent to Triage Pool. Advised of 4 business hour SLA and this call may come from a blocked number. [facility] [place] [facility] -  Alyssa Alyssa >> Mar 27, 2024 12:34 PM Alyssa Alyssa wrote: Alyssa Alyssa, Alyssa is calling other request    Include all details related to the request(s) below: Alyssa Alyssa, husband, is calling to request to be contacted by Dr.Ellison.  Alyssa would like Dr.Ellison to be aware the patient is having a difficult time at the moment and he is requesting to be contacted in regards to this matter.  Confirm and type the Best Contact Number below:  Patient/caller contact number:       [phone]      [] Home  [x] Mobile  [] Work  []  Other   []  Okay to leave a voicemail   Medication List:  Current Outpatient Medications:  .  cholecalciferol (VITAMIN D3) 5,000 unit (125 mcg) tab tablet, Take by mouth., Disp: , Rfl:  .  citalopram (CeleXA) 20 mg tablet, Take 1 tablet (20 mg total) by mouth every morning., Disp: 30 tablet, Rfl: 2 .  clonazePAM  (KlonoPIN ) 0.5 mg tablet, Take 1 tablet (0.5 mg total) by mouth every morning., Disp: 30 tablet, Rfl: 1 .  cyanocobalamin, vitamin B-12, (cyanocobalamin,vit B-12,,bulk,) powd, Take 1 tablet by mouth daily., Disp: , Rfl:  .  EPINEPHrine (EPIPEN) 0.3 mg/0.3 mL injection syringe, Inject 0.3 mg into the muscle once as needed. Indications: person at risk of anaphylaxis, Disp: , Rfl:  .  lamoTRIgine (LaMICtal) 25 mg tablet, Take 1 tablet daily for 2 weeks THEN 2 tablets daily for 2 weeks THEN next dose will be sent in, Disp: 42 tablet, Rfl: 3 .  levothyroxine  (SYNTHROID ) 137 mcg tablet, Take 137 mcg by mouth., Disp: , Rfl:  .  olopatadine (Pataday Once Daily Relief) 0.2 % ophthalmic solution, 1 drop., Disp: , Rfl:   .  pyridoxine (VITAMIN B6) 100 mg tablet, Take 100 mg by mouth., Disp: , Rfl:  .  traZODone  (DESYREL ) 150 mg tablet, Take 1 tablet (150 mg total) by mouth at bedtime., Disp: 30 tablet, Rfl: 2 .  valACYclovir (VALTREX) 500 mg tablet, Take 500 mg by mouth daily as needed. Indications: herpes simplex infection, Disp: 30 tablet, Rfl: 0     Medication Request/Refills: Pharmacy Information (if applicable)   [x]  Not Applicable       []  Pharmacy listed  Send Medication Request to:                                                 []  Pharmacy not listed (added to pharmacy list in Epic) Send Medication Request to:      Listed Pharmacies: [id number] - [place], [place] - [address] AT [facility] OF [address] - PHONE: [phone] - FAX: [phone] [facility] #[id number] - [place], [place] - [address] AT [facility] OF [address] - PHONE: [phone] - FAX: [phone] [facility] - [place], [place] - [address] [facility] AT Portal to Registered [facility] - PHONE: [phone] - FAX: [phone] [facility] #  [id number] [place], [place] - [address] AT [facility] - PHONE: [phone] - FAX: [phone]

## 2024-05-14 ENCOUNTER — Encounter (HOSPITAL_COMMUNITY): Payer: Self-pay

## 2024-05-14 ENCOUNTER — Other Ambulatory Visit: Payer: Self-pay

## 2024-05-14 ENCOUNTER — Emergency Department (HOSPITAL_COMMUNITY)
Admission: EM | Admit: 2024-05-14 | Discharge: 2024-05-15 | Disposition: A | Discharge status: Other Acute Inpatient Hospital | Attending: Emergency Medicine | Admitting: Emergency Medicine

## 2024-05-14 DIAGNOSIS — F29 Unspecified psychosis not due to a substance or known physiological condition: Secondary | ICD-10-CM | POA: Diagnosis not present

## 2024-05-14 DIAGNOSIS — F411 Generalized anxiety disorder: Secondary | ICD-10-CM | POA: Diagnosis present

## 2024-05-14 DIAGNOSIS — F333 Major depressive disorder, recurrent, severe with psychotic symptoms: Secondary | ICD-10-CM | POA: Diagnosis not present

## 2024-05-14 DIAGNOSIS — F419 Anxiety disorder, unspecified: Secondary | ICD-10-CM

## 2024-05-14 DIAGNOSIS — F32A Depression, unspecified: Secondary | ICD-10-CM

## 2024-05-14 DIAGNOSIS — Z85850 Personal history of malignant neoplasm of thyroid: Secondary | ICD-10-CM | POA: Insufficient documentation

## 2024-05-14 DIAGNOSIS — Z79899 Other long term (current) drug therapy: Secondary | ICD-10-CM | POA: Diagnosis not present

## 2024-05-14 DIAGNOSIS — I10 Essential (primary) hypertension: Secondary | ICD-10-CM | POA: Insufficient documentation

## 2024-05-14 LAB — COMPREHENSIVE METABOLIC PANEL WITH GFR
ALT: 17 U/L (ref 0–44)
AST: 28 U/L (ref 15–41)
Albumin: 4.6 g/dL (ref 3.5–5.0)
Alkaline Phosphatase: 76 U/L (ref 38–126)
Anion gap: 12 (ref 5–15)
BUN: 23 mg/dL (ref 8–23)
CO2: 24 mmol/L (ref 22–32)
Calcium: 9.4 mg/dL (ref 8.9–10.3)
Chloride: 101 mmol/L (ref 98–111)
Creatinine, Ser: 0.96 mg/dL (ref 0.44–1.00)
GFR, Estimated: >60 mL/min (ref >60)
Glucose, Bld: 97 mg/dL (ref 70–99)
Potassium: 3.7 mmol/L (ref 3.5–5.1)
Sodium: 137 mmol/L (ref 135–145)
Total Bilirubin: 1.7 mg/dL — ABNORMAL HIGH (ref 0.0–1.2)
Total Protein: 8.2 g/dL — ABNORMAL HIGH (ref 6.5–8.1)

## 2024-05-14 LAB — CBC WITH DIFFERENTIAL/PLATELET
Abs Immature Granulocytes: 0.02 10*3/uL (ref 0.00–0.07)
Basophils Absolute: 0.1 10*3/uL (ref 0.0–0.1)
Basophils Relative: 1 %
Eosinophils Absolute: 0 10*3/uL (ref 0.0–0.5)
Eosinophils Relative: 0 %
HCT: 46.1 % — ABNORMAL HIGH (ref 36.0–46.0)
Hemoglobin: 15.4 g/dL — ABNORMAL HIGH (ref 12.0–15.0)
Immature Granulocytes: 0 %
Lymphocytes Relative: 21 %
Lymphs Abs: 1.5 10*3/uL (ref 0.7–4.0)
MCH: 29.8 pg (ref 26.0–34.0)
MCHC: 33.4 g/dL (ref 30.0–36.0)
MCV: 89.3 fL (ref 80.0–100.0)
Monocytes Absolute: 0.3 10*3/uL (ref 0.1–1.0)
Monocytes Relative: 4 %
Neutro Abs: 5.4 10*3/uL (ref 1.7–7.7)
Neutrophils Relative %: 74 %
Platelets: 328 10*3/uL (ref 150–400)
RBC: 5.16 MIL/uL — ABNORMAL HIGH (ref 3.87–5.11)
RDW: 13 % (ref 11.5–15.5)
WBC: 7.3 10*3/uL (ref 4.0–10.5)
nRBC: 0 % (ref 0.0–0.2)

## 2024-05-14 LAB — URINALYSIS, COMPLETE (UACMP) WITH MICROSCOPIC
Bacteria, UA: NONE SEEN
Bilirubin Urine: NEGATIVE
Glucose, UA: NEGATIVE mg/dL
Hgb urine dipstick: NEGATIVE
Ketones, ur: NEGATIVE mg/dL
Leukocytes,Ua: NEGATIVE
Nitrite: NEGATIVE
Protein, ur: NEGATIVE mg/dL
Specific Gravity, Urine: 1.001 — ABNORMAL LOW (ref 1.005–1.030)
pH: 6 (ref 5.0–8.0)

## 2024-05-14 LAB — RAPID URINE DRUG SCREEN, HOSP PERFORMED
Amphetamines: NOT DETECTED
Barbiturates: NOT DETECTED
Benzodiazepines: NOT DETECTED
Cocaine: NOT DETECTED
Opiates: NOT DETECTED
Tetrahydrocannabinol: NOT DETECTED

## 2024-05-14 LAB — TSH: TSH: 52.018 u[IU]/mL — ABNORMAL HIGH (ref 0.350–4.500)

## 2024-05-14 LAB — T4, FREE: Free T4: 0.52 ng/dL — ABNORMAL LOW (ref 0.61–1.12)

## 2024-05-14 LAB — ETHANOL: Alcohol, Ethyl (B): <15 mg/dL (ref <15)

## 2024-05-14 MED ORDER — LEVOTHYROXINE SODIUM 137 MCG PO TABS
137.0000 ug | ORAL_TABLET | Freq: Every day | ORAL | Status: DC
  Administered 2024-05-15: 137 ug via ORAL
  Filled 2024-05-14: qty 1

## 2024-05-14 MED ORDER — STERILE WATER FOR INJECTION IJ SOLN
INTRAMUSCULAR | Status: AC
  Administered 2024-05-14: 10 mL
  Filled 2024-05-14: qty 10

## 2024-05-14 MED ORDER — ZIPRASIDONE MESYLATE 20 MG IM SOLR
20.0000 mg | Freq: Once | INTRAMUSCULAR | Status: AC
  Administered 2024-05-14: 20 mg via INTRAMUSCULAR
  Filled 2024-05-14: qty 20

## 2024-05-14 MED ORDER — LORAZEPAM 2 MG/ML IJ SOLN
2.0000 mg | Freq: Once | INTRAMUSCULAR | Status: AC
  Administered 2024-05-14: 2 mg via INTRAMUSCULAR
  Filled 2024-05-14: qty 1

## 2024-05-14 NOTE — ED Provider Notes (Signed)
 [place] EMERGENCY DEPARTMENT AT [facility] Provider Note   CSN: [id number] Arrival date & time: 05/14/24  1312     History  Chief Complaint  Patient presents with   Psychiatric Evaluation    Alyssa [name] is a 73 y.o. female.  Patient here with family.  History of major depressive disorder with psychotic features.  Not taking her medications.  Found wandering in a busy street by neighbor.  Patient was brought back to the house and husband brought her here for evaluation.  She endorses being extremely anxious.  She denies any chest pain abdominal pain shortness of breath weakness numbness tingling.  Husband states that she has endorsed increased anxiety and has noticed more psychotic features here recently similar to when she was hospitalized for the same.  Denies any falls.  But has not taken her medicine in quite some time.  Has not followed up with psychiatry.  The history is provided by the patient and a caregiver.       Home Medications Prior to Admission medications   Medication Sig Start Date End Date Taking? Authorizing Provider  clonazePAM (KLONOPIN) 0.5 MG tablet Take 0.5 mg by mouth 3 (three) times daily as needed for anxiety.    [provider]  escitalopram  (LEXAPRO ) 10 MG tablet Take 1 tablet (10 mg total) by mouth daily. Patient not taking: Reported on 02/09/2024 09/07/22   Wray Heady, MD  Estradiol  10 MCG TABS vaginal tablet Place 1 tablet (10 mcg total) vaginally 2 (two) times a week. Patient not taking: Reported on 02/09/2024 12/13/22   Granville Layer, MD  levothyroxine  (SYNTHROID ) 137 MCG tablet Take 137 mcg by mouth daily before breakfast. 10/23/22   [provider]  LORazepam  (ATIVAN ) 1 MG tablet Take 1 tablet (1 mg total) by mouth daily as needed for anxiety. Patient not taking: Reported on 02/09/2024 09/07/22   Wray Heady, MD  QUEtiapine  (SEROQUEL ) 100 MG tablet Take 1 tablet (100 mg total) by mouth at bedtime. Patient not  taking: Reported on 02/09/2024 09/07/22   Wray Heady, MD  Travoprost, BAK Free, (TRAVATAN) 0.004 % SOLN ophthalmic solution Place 1 drop into both eyes at bedtime.    [provider]  traZODone  (DESYREL ) 50 MG tablet Take 1 tablet (50 mg total) by mouth at bedtime as needed for sleep. Patient not taking: Reported on 02/09/2024 08/02/23   Mozingo, Regina Nattalie, NP  buPROPion  (WELLBUTRIN  XL) 150 MG 24 hr tablet Take 150 mg by mouth every morning. 07/13/22 09/07/22  [provider]  OLANZapine  zydis (ZYPREXA ) 5 MG disintegrating tablet Take 0.5 tablets (2.5 mg total) by mouth 2 (two) times daily. Patient not taking: Reported on 08/18/2022 06/07/22 09/07/22  Nguyen, Julie, DO      Allergies    Amoxicillin and Clavulanic acid    Review of Systems   Review of Systems  Physical Exam Updated Vital Signs BP (!) 134/104 (BP Location: Right Arm)   Pulse (!) 107   Temp 98.2 F (36.8 C) (Oral)   Resp 20   SpO2 98%  Physical Exam Vitals and nursing note reviewed.  Constitutional:      General: She is not in acute distress.    Appearance: She is well-developed. She is not ill-appearing.  HENT:     Head: Normocephalic and atraumatic.     Nose: Nose normal.     Mouth/Throat:     Mouth: Mucous membranes are moist.  Eyes:     Extraocular Movements: Extraocular movements  intact.     Conjunctiva/sclera: Conjunctivae normal.     Pupils: Pupils are equal, round, and reactive to light.  Cardiovascular:     Rate and Rhythm: Normal rate and regular rhythm.     Pulses: Normal pulses.     Heart sounds: No murmur heard. Pulmonary:     Effort: Pulmonary effort is normal. No respiratory distress.     Breath sounds: Normal breath sounds.  Abdominal:     Palpations: Abdomen is soft.     Tenderness: There is no abdominal tenderness.  Musculoskeletal:        General: No swelling.     Cervical back: Normal range of motion and neck supple.  Skin:    General: Skin is warm and dry.      Capillary Refill: Capillary refill takes less than 2 seconds.  Neurological:     General: No focal deficit present.     Mental Status: She is alert and oriented to person, place, and time.     Cranial Nerves: No cranial nerve deficit.     Sensory: No sensory deficit.     Motor: No weakness.     Coordination: Coordination normal.  Psychiatric:     Comments: Patient is extremely jittery and anxious on exam but denies SI HI denies hallucinations     ED Results / Procedures / Treatments   Labs (all labs ordered are listed, but only abnormal results are displayed) Labs Reviewed  COMPREHENSIVE METABOLIC PANEL WITH GFR - Abnormal; Notable for the following components:      Result Value   Total Protein 8.2 (*)    Total Bilirubin 1.7 (*)    All other components within normal limits  CBC WITH DIFFERENTIAL/PLATELET - Abnormal; Notable for the following components:   RBC 5.16 (*)    Hemoglobin 15.4 (*)    HCT 46.1 (*)    All other components within normal limits  ETHANOL  RAPID URINE DRUG SCREEN, HOSP PERFORMED  URINALYSIS, ROUTINE W REFLEX MICROSCOPIC  TSH  T4, FREE  URINALYSIS, COMPLETE (UACMP) WITH MICROSCOPIC    EKG EKG Interpretation Date/Time:  Thursday May 14 2024 13:54:44 EDT Ventricular Rate:  116 PR Interval:  163 QRS Duration:  89 QT Interval:  429 QTC Calculation: 597 R Axis:   71  Text Interpretation: Sinus tachycardia Multiple ventricular premature complexes artificat t wave depression Confirmed by Lowery Rue [phone]) on 05/14/2024 2:00:45 PM  Radiology No results found.  Procedures Procedures    Medications Ordered in ED Medications  levothyroxine  (SYNTHROID ) tablet 137 mcg (has no administration in time range)  ziprasidone  (GEODON ) injection 20 mg (20 mg Intramuscular Given 05/14/24 1437)  LORazepam  (ATIVAN ) injection 2 mg (2 mg Intramuscular Given 05/14/24 1437)  sterile water (preservative free) injection (10 mLs  Given 05/14/24 1438)    ED Course/  Medical Decision Making/ A&P                                 Medical Decision Making Amount and/or Complexity of Data Reviewed Labs: ordered.  Risk Prescription drug management.   Alyssa [name]  Is here for mental health evaluation.  History of major depressive disorder with psychotic features.  History of thyroid  disease.  She is here with family member who states that she was found wandering the streets is very anxious jittery.  He has noticed that her depression seems to be getting worse her psychotic features seem to be  increasing.  Patient has not been taking her medications.  Overall she is very jittery anxious.  But she denies SI HI.  Overall, medical screening labs were obtained.  IVC obtained given that I am concerned about her wellbeing given that she is wandering and busy streets.  Family worried about her safety.  She could easily get hit by car he states.  Medical screening labs are unremarkable.  No significant leukocytosis anemia or electrolyte abnormality.  EKG shows sinus rhythm.  No obvious ischemic changes.  Artifact.  Ultimately patient was given Geodon  and Ativan  IVC was filled.  She has been redirectable but she got very anxious and looks like she was about to try to leave 1 we talked about being evaluated by psychiatry.  Ultimately she is medically cleared at this time.  Will have her evaluated by psychiatry.  She is not having any abdominal pain chest pain shortness of breath weakness numbness tingling.  She has not had any falls.  Medically cleared awaiting psychiatry evaluation.  IVC filled.  This chart was dictated using voice recognition software.  Despite best efforts to proofread,  errors can occur which can change the documentation meaning.         Final Clinical Impression(s) / ED Diagnoses Final diagnoses:  Depression, unspecified depression type  Psychosis, unspecified psychosis type [facility])  Anxiety    Rx / DC Orders ED Discharge Orders     None          Lowery Rue, DO 05/14/24 1637

## 2024-05-14 NOTE — ED Notes (Signed)
 Medicated patient according to mar. Pt was unsure about medication administration, but husband was able help calm her down and helped keep pts arm steady for administration. Pt then began to shake a bit more tremulously and became a bit diaphoretic, but eyes continued to track and pt was verbal and stated she was okay during this episode. Appears a lot more calm now, this rn and tech helped move pt into bed and under covers. In nad at this time, provider aware. Husband at bedside

## 2024-05-14 NOTE — ED Notes (Signed)
 Given patient a water with ice per request. Offered pillow for head, pt declined at this time

## 2024-05-14 NOTE — Progress Notes (Signed)
 Pt was accepted to [facility] 05/14/2024 Bed Assignment: 31  Address: [address] [place], [place], [place] [address]  Pt meets inpatient criteria per Chandra Come, NP  Attending Physician will be Dr. Romona Cobb  Report can be called to: [phone]  Pt can arrive ASAP  Care Team notified: [facility] Shelbie Dess, RN, Teresia Fennel, RN

## 2024-05-14 NOTE — ED Notes (Signed)
 Pt's husband has taken belongings home.

## 2024-05-14 NOTE — ED Notes (Signed)
 Pt changed out into pysch scrubs. Belonings have been given to husband.

## 2024-05-14 NOTE — Consult Note (Signed)
 [facility] Psychiatric Consult Initial  Patient Name: .ARINE FOLEY  MRN: [id number]  DOB: 02/07/1951  Consult Order details:  Orders (From admission, onward)     Start     Ordered   05/14/24 1537  CONSULT TO CALL ACT TEAM       Ordering Provider: Lowery Rue, DO  Provider:  (Not yet assigned)  Question:  Reason for Consult?  Answer:  Psych consult   05/14/24 1536             Mode of Visit: In person    Psychiatry Consult Evaluation  Service Date: May 14, 2024 LOS:  LOS: 0 days  Chief Complaint altered mental status   Primary Psychiatric Diagnoses  MDD with psychosis  2.   GAD  Assessment  Alyssa [name] is a 73 y.o. female admitted: Presented to the ED on 05/14/2024  1:26 PM for  having episodes non stop hours of pacing, fidgeting with hands, saying words that do not make sense, and paranoia. 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 She meets criteria for inpatient psychiatric admission. Current outpatient psychotropic medications include Klonopin, Ativan , Lexapro , Seroquel , and Trazodone  and historically she has had a beneficial response to these medications. She was non compliant with medications prior to admission as evidenced by patient spouse. On initial examination, patient is restless, anxious, and talking nonsensical, asking her husband to finish her thoughts. Please see plan below for detailed recommendations.   Diagnoses:  Active Hospital problems: Principal Problem:   MDD (major depressive disorder), recurrent, severe, with psychosis  (HCC) Active Problems:   Generalized anxiety disorder    Plan   ## Psychiatric Medication Recommendations:  Continue patient's home medications   ## Medical Decision Making Capacity: Not specifically addressed in this encounter   ## Further Work-up:  -- Ordered urinalysis to rule out UTI EKG or U/A -- Ordered an EKG on 02/09/24 -- Pertinent labwork reviewed earlier this admission includes: CBC, TSH, CMP, EKG, UA, UDS     ## Disposition:-- We recommend inpatient psychiatric hospitalization. Patient has been involuntarily committed on 05/14/24.    ## Behavioral / Environmental: -To minimize splitting of staff, assign one staff person to communicate all information from the team when feasible. or Utilize compassion and acknowledge the patient's experiences while setting clear and realistic expectations for care.                ## Safety and Observation Level:  - Based on my clinical evaluation, I estimate the patient to be at low risk of self harm in the current setting. - At this time, we recommend  routine. This decision is based on my review of the chart including patient's history and current presentation, interview of the patient, mental status examination, and consideration of suicide risk including evaluating suicidal ideation, plan, intent, suicidal or self-harm behaviors, risk factors, and protective factors. This judgment is based on our ability to directly address suicide risk, implement suicide prevention strategies, and develop a safety plan while the patient is in the clinical setting. Please contact our team if there is a concern that risk level has changed.   CSSR Risk Category:C-SSRS RISK  CATEGORY: No Risk   Suicide Risk Assessment: Patient has following modifiable risk factors for suicide: under treated depression  and recklessness, which we are addressing by recommending inpatient psychiatric admission. Patient has following non-modifiable or demographic risk factors for  suicide: psychiatric hospitalization Patient has the following protective factors against suicide: Access to outpatient mental health care and Supportive family   Thank you for this consult request. Recommendations have been communicated to the primary team.  We will recommend inpatient psychiatric admission at this time.  [name], PMHNP       History of Present Illness  Relevant Aspects of Hospital ED Course:  Admitted on 05/14/2024  for having episodes non stop hours of pacing, fidgeting with hands, saying words that do not make sense, and paranoia.    Patient Report:  Alyssa [name], 73 y.o., female patient seen face to face by this provider, consulted with Dr. Deborah Falling; and chart reviewed on 05/14/24.  On evaluation Alyssa [name] patient is accompanied by her husband Denni France, who patient allowed to stay at her bedside during evaluation. On evaluation LAURIEANN FRIDDLE reports that she is very tired, and having anxiety issues, but unable to elaborate and asked her husband to try and finish what she is saying.  This provider continues to as patient was specifically brought her to the hospital, patient is presenting with disorganized speech and talking nonsensical, stating "I am waiting for a flight"her husband states that he does not know what that means.  This provider asked if she purposely mean to go into the busy street, she states yes, states because of things going on at home, then tells her husband to inform me of what is going on at home.  He states "I do not know what you are talking about help me", she states "you know, the squatters".  Mr. Antonio begins to tell me that she has been having delusional thoughts squatters being in the home, taking flights or that airplanes are about to leave her, and thoughts that there dog is going to be harmed.  He states she continues to be very restless, has not taken her medication in about 2 weeks, states that she took herself off of it.  She  states that she does see Dr. Mancel Seashore, psychiatrist at [facility].  But states that she has lately been canceling her appointments and not wanted to see the doctor.  During the assessment patient is alert and oriented to self and environment, she appears to be very restless as she keeps talking concerning in the hospital gurney, and sitting up and laying back down.  Patient was given Ativan  and Geodon  on admission due to increased anxiety and altered mental status.  Patient appeared to be very altered, disorganized and confused, constantly drink sips of water, stating that the IM injections made her voice horse and drive  Husband notes onset of mental health concerns age 59. Describes period of 10 yrs of intense mania episodes. Episodes of elevated energy, hyperspending, dissociation from mundane world, decreased need for sleep, rapid speaking; symptoms would last a few weeks. Maxed out credit cards. He states she gets up out of bed, eats bfast then starts in pacing and repetitive thoughts ie. Regretting decisions and ruminating on that, self blame, lots of "I should have done that and things would be different" Denies SI. Denies plans or intent to harm self  Psych ROS:  Depression: Denies  Anxiety: Endorses Mania (lifetime and current): Denies  Psychosis: (  lifetime and current): Yes  Collateral information:  Contacted none, husband is at bedside  Review of Systems  Psychiatric/Behavioral:  Positive for depression and hallucinations.      Psychiatric and Social History  Psychiatric History:  Information collected from patient and chart review  Prev Dx/Sx: Bipolar, MDD Current Psych Provider: Dr. Mancel Seashore  Home Meds (current): Klonopin, Lexapro , Ativan , Seroquel , and trazodone  Previous Med Trials: Yes Therapy: Yes  Prior Psych Hospitalization: Yes Prior Self Harm: Denies Prior Violence: Yes  Family Psych History: Yes Family Hx suicide: Denies  Social History:   Developmental Hx: Age appropriate  Educational Hx: Graduated high school Occupational Hx: Retired Armed forces operational officer Hx: Denies  Living Situation: Lives with spouse  Spiritual Hx:  Yes Access to weapons/lethal means: Denies     Substance History Alcohol: Yes  Type of alcohol beer, wine Last Drink per husband and patient has not had any wine in months Number of drinks per day beer denies History of alcohol withdrawal seizures Denies  History of DT's Denies  Tobacco: Denies  Illicit drugs: Denies  Prescription drug abuse: Denies  Rehab hx: Denies   Exam Findings  Physical Exam:  Vital Signs:  Temp:  [98.2 F (36.8 C)] 98.2 F (36.8 C) (06/05 1408) Pulse Rate:  [107] 107 (06/05 1408) Resp:  [20] 20 (06/05 1408) BP: (134)/(104) 134/104 (06/05 1408) SpO2:  [98 %] 98 % (06/05 1408) Blood pressure (!) 134/104, pulse (!) 107, temperature 98.2 F (36.8 C), temperature source Oral, resp. rate 20, SpO2 98%. There is no height or weight on file to calculate BMI.  Physical Exam Vitals and nursing note reviewed. Exam conducted with a chaperone present.  Neurological:     Mental Status: She is alert.  Psychiatric:        Attention and Perception: She is inattentive.        Mood and Affect: Mood is anxious.        Behavior: Behavior is cooperative.        Thought Content: Thought content is paranoid and delusional.        Cognition and Memory: Cognition is impaired.        Judgment: Judgment is inappropriate.     Mental Status Exam: General Appearance: Casual  Orientation:  Full (Time, Place, and Person)  Memory:  Immediate;   Fair Remote;   Fair  Concentration:  Concentration: Fair and Attention Span: Fair  Recall:  Good  Attention  Fair  Eye Contact:  Good  Speech:  Clear and Coherent  Language:  Good  Volume:  Normal  Mood: sad  Affect:  Congruent  Thought Process:  Coherent  Thought Content:  WDL  Suicidal Thoughts:  No  Homicidal Thoughts:  No  Judgement:  Fair  Insight:   Good  Psychomotor Activity:  Normal  Akathisia:  No  Fund of Knowledge:  Fair       Assets:  Manufacturing systems engineer Desire for Improvement Financial Resources/Insurance Housing Social Support  Cognition:  WNL  ADL's:  Intact  AIMS (if indicated):        Other History   These have been pulled in through the EMR, reviewed, and updated if appropriate.  Family History:  The patient's family history includes Diabetes in her paternal grandmother; Hypertension in her sister; Skin cancer in her brother; Thyroid  cancer in her brother.  Medical History: Past Medical History:  Diagnosis Date   Anxiety and depression    Arthritis    Cervicogenic headache    Claustrophobia  Glaucoma    Hypertension    Lumbar radiculopathy    Postsurgical hypothyroidism    Spinal stenosis    Thyroid  cancer [facility])    s/p thyroidectomy    Surgical History: Past Surgical History:  Procedure Laterality Date   FOOT SURGERY     LAMINOTOMY  04/03/2021   LEFT L3/4 LAMINOTOMY BILATERAL L3/4 MEDIAL FACETECTOMY   LUMBAR LAMINECTOMY  03/03/2018   THYROIDECTOMY  12/2004     Medications:   Current Facility-Administered Medications:    [START ON 05/15/2024] levothyroxine  (SYNTHROID ) tablet 137 mcg, 137 mcg, Oral, QAC breakfast, Curatolo, Adam, DO  Current Outpatient Medications:    clonazePAM (KLONOPIN) 0.5 MG tablet, Take 0.5 mg by mouth daily as needed for anxiety., Disp: , Rfl:    Travoprost, BAK Free, (TRAVATAN) 0.004 % SOLN ophthalmic solution, Place 1 drop into both eyes at bedtime., Disp: , Rfl:    traZODone  (DESYREL ) 150 MG tablet, Take 150 mg by mouth at bedtime., Disp: , Rfl:    escitalopram  (LEXAPRO ) 10 MG tablet, Take 1 tablet (10 mg total) by mouth daily. (Patient not taking: Reported on 05/14/2024), Disp: 30 tablet, Rfl: 0   Estradiol  10 MCG TABS vaginal tablet, Place 1 tablet (10 mcg total) vaginally 2 (two) times a week., Disp: 30 tablet, Rfl: 12   levothyroxine  (SYNTHROID ) 137 MCG tablet,  Take 137 mcg by mouth daily before breakfast., Disp: , Rfl:    LORazepam  (ATIVAN ) 1 MG tablet, Take 1 tablet (1 mg total) by mouth daily as needed for anxiety., Disp: 30 tablet, Rfl: 0   QUEtiapine  (SEROQUEL ) 100 MG tablet, Take 1 tablet (100 mg total) by mouth at bedtime., Disp: 30 tablet, Rfl: 0   traZODone  (DESYREL ) 50 MG tablet, Take 1 tablet (50 mg total) by mouth at bedtime as needed for sleep. (Patient not taking: Reported on 05/14/2024), Disp: 30 tablet, Rfl: 0  Allergies: Allergies  Allergen Reactions   Amoxicillin Hives   Clavulanic Acid Other (See Comments)    Family is unaware of this     Chandra Come, PMHNP

## 2024-05-14 NOTE — ED Notes (Signed)
 Patient resting in bed quietly

## 2024-05-14 NOTE — ED Triage Notes (Signed)
 Pt came in for a psych evaluation. Per visitor pt was found altered and walking on a busy street with her gown on. Pt is extremely anxious and jittery upon arrival. Pt visitor stated she has had an issue like this before.

## 2024-05-14 NOTE — ED Notes (Signed)
 Pt will not come back to triage until her husband comes in.

## 2024-05-15 ENCOUNTER — Inpatient Hospital Stay
Admission: RE | Admit: 2024-05-15 | Discharge: 2024-05-22 | DRG: 885 | Disposition: A | Discharge status: Home / Self Care | Source: Intra-hospital | Attending: Psychiatry | Admitting: Psychiatry

## 2024-05-15 ENCOUNTER — Encounter: Payer: Self-pay | Admitting: Psychiatry

## 2024-05-15 DIAGNOSIS — F411 Generalized anxiety disorder: Secondary | ICD-10-CM | POA: Diagnosis present

## 2024-05-15 DIAGNOSIS — Z833 Family history of diabetes mellitus: Secondary | ICD-10-CM | POA: Diagnosis not present

## 2024-05-15 DIAGNOSIS — Z888 Allergy status to other drugs, medicaments and biological substances status: Secondary | ICD-10-CM

## 2024-05-15 DIAGNOSIS — T381X6A Underdosing of thyroid hormones and substitutes, initial encounter: Secondary | ICD-10-CM | POA: Diagnosis present

## 2024-05-15 DIAGNOSIS — Z79890 Hormone replacement therapy (postmenopausal): Secondary | ICD-10-CM

## 2024-05-15 DIAGNOSIS — F40240 Claustrophobia: Secondary | ICD-10-CM | POA: Diagnosis present

## 2024-05-15 DIAGNOSIS — F061 Catatonic disorder due to known physiological condition: Secondary | ICD-10-CM | POA: Diagnosis present

## 2024-05-15 DIAGNOSIS — Z91410 Personal history of adult physical and sexual abuse: Secondary | ICD-10-CM | POA: Diagnosis not present

## 2024-05-15 DIAGNOSIS — M5416 Radiculopathy, lumbar region: Secondary | ICD-10-CM | POA: Diagnosis present

## 2024-05-15 DIAGNOSIS — Z79899 Other long term (current) drug therapy: Secondary | ICD-10-CM | POA: Diagnosis not present

## 2024-05-15 DIAGNOSIS — F22 Delusional disorders: Secondary | ICD-10-CM | POA: Diagnosis present

## 2024-05-15 DIAGNOSIS — Z85850 Personal history of malignant neoplasm of thyroid: Secondary | ICD-10-CM

## 2024-05-15 DIAGNOSIS — H409 Unspecified glaucoma: Secondary | ICD-10-CM | POA: Diagnosis present

## 2024-05-15 DIAGNOSIS — T424X6A Underdosing of benzodiazepines, initial encounter: Secondary | ICD-10-CM | POA: Diagnosis present

## 2024-05-15 DIAGNOSIS — Z8249 Family history of ischemic heart disease and other diseases of the circulatory system: Secondary | ICD-10-CM

## 2024-05-15 DIAGNOSIS — F329 Major depressive disorder, single episode, unspecified: Principal | ICD-10-CM | POA: Diagnosis present

## 2024-05-15 DIAGNOSIS — Z630 Problems in relationship with spouse or partner: Secondary | ICD-10-CM | POA: Diagnosis not present

## 2024-05-15 DIAGNOSIS — Z880 Allergy status to penicillin: Secondary | ICD-10-CM

## 2024-05-15 DIAGNOSIS — F332 Major depressive disorder, recurrent severe without psychotic features: Secondary | ICD-10-CM | POA: Diagnosis not present

## 2024-05-15 DIAGNOSIS — E890 Postprocedural hypothyroidism: Secondary | ICD-10-CM | POA: Diagnosis present

## 2024-05-15 DIAGNOSIS — F333 Major depressive disorder, recurrent, severe with psychotic symptoms: Principal | ICD-10-CM | POA: Diagnosis present

## 2024-05-15 DIAGNOSIS — Z808 Family history of malignant neoplasm of other organs or systems: Secondary | ICD-10-CM

## 2024-05-15 DIAGNOSIS — Z91128 Patient's intentional underdosing of medication regimen for other reason: Secondary | ICD-10-CM | POA: Diagnosis not present

## 2024-05-15 DIAGNOSIS — I10 Essential (primary) hypertension: Secondary | ICD-10-CM | POA: Diagnosis present

## 2024-05-15 DIAGNOSIS — Z91138 Patient's unintentional underdosing of medication regimen for other reason: Secondary | ICD-10-CM

## 2024-05-15 DIAGNOSIS — Z923 Personal history of irradiation: Secondary | ICD-10-CM | POA: Diagnosis not present

## 2024-05-15 DIAGNOSIS — E039 Hypothyroidism, unspecified: Principal | ICD-10-CM | POA: Diagnosis present

## 2024-05-15 MED ORDER — TRAZODONE HCL 50 MG PO TABS
50.0000 mg | ORAL_TABLET | Freq: Every evening | ORAL | Status: DC | PRN
  Administered 2024-05-17 – 2024-05-21 (×4): 50 mg via ORAL
  Filled 2024-05-15 (×5): qty 1

## 2024-05-15 MED ORDER — ALUM & MAG HYDROXIDE-SIMETH 200-200-20 MG/5ML PO SUSP: 30.0000 mL | ORAL | Status: DC | PRN

## 2024-05-15 MED ORDER — HYDROXYZINE HCL 50 MG PO TABS
50.0000 mg | ORAL_TABLET | Freq: Four times a day (QID) | ORAL | Status: DC | PRN
  Administered 2024-05-15 – 2024-05-17 (×3): 50 mg via ORAL
  Filled 2024-05-15 (×4): qty 1

## 2024-05-15 MED ORDER — MAGNESIUM HYDROXIDE 400 MG/5ML PO SUSP
30.0000 mL | Freq: Every day | ORAL | Status: DC | PRN
  Administered 2024-05-21: 30 mL via ORAL
  Filled 2024-05-15: qty 30

## 2024-05-15 MED ORDER — OLANZAPINE 10 MG IM SOLR: 5.0000 mg | Freq: Three times a day (TID) | INTRAMUSCULAR | Status: DC | PRN

## 2024-05-15 MED ORDER — ACETAMINOPHEN 325 MG PO TABS: 650.0000 mg | ORAL_TABLET | Freq: Four times a day (QID) | ORAL | Status: DC | PRN

## 2024-05-15 MED ORDER — OLANZAPINE 5 MG PO TBDP: 5.0000 mg | ORAL_TABLET | Freq: Three times a day (TID) | ORAL | Status: DC | PRN

## 2024-05-15 NOTE — Progress Notes (Signed)
 Patient is a 72 year old female admitted involuntarily to the [facility] floor [facility] after being found wandering on a busy street, complaints of increasing anxiety, and reports of medication non adherence.  Patient presents to assessment ambulatory. She is A+O x 4. She currently denies SI/HI/AVH. Patient's affect is anxious and speech is soft. Patient endorses depression and anxiety. She currently denies pain. Patient denies the use of a mobility aid at home. Denies needing eyeglasses. Reports last BM on May 15, 2024.  Patient denies smoking cigs or drinking alcohol. Patient says that her support system is her husband. Her goal while she is here is "to have anxiety get better."   Skin assessment and body search completed with Amalia Badder, Charity fundraiser. Skin: warm/dry. Lower back scar, scratches on upper bil arms. No contrabands found.   Emotional support and reassurance provided throughout admission intake. Consents signed. Afterwards, oriented patient to unit, room and call light, reviewed POC with all questions answered and concerns voiced. Patient verbalized understanding. Denies any needs at this time.  Will continue to monitor with ongoing Q 15 minute safety checks.

## 2024-05-15 NOTE — Progress Notes (Signed)
   05/15/24 1300  Psych Admission Type (Psych Patients Only)  Admission Status Involuntary  Psychosocial Assessment  Patient Complaints Anxiety  Eye Contact Brief  Facial Expression Flat  Affect Anxious  Speech Soft;Logical/coherent  Interaction Minimal  Motor Activity Restless;Fidgety  Appearance/Hygiene In scrubs  Behavior Characteristics Cooperative  Mood Anxious;Depressed  Thought Process  Coherency WDL  Content WDL  Delusions WDL  Perception WDL  Hallucination None reported or observed  Judgment WDL  Confusion None  Danger to Self  Current suicidal ideation? Denies  Danger to Others  Danger to Others None reported or observed

## 2024-05-15 NOTE — Tx Team (Signed)
 Initial Treatment Plan 05/15/2024 1:29 PM Alyssa [name] ZOX:[id number]    PATIENT STRESSORS: Medication change or noncompliance     PATIENT STRENGTHS: Ability for insight  Supportive family/friends    PATIENT IDENTIFIED PROBLEMS:   "I need help with my anxiety."                   DISCHARGE CRITERIA:  Ability to meet basic life and health needs Adequate post-discharge living arrangements Improved stabilization in mood, thinking, and/or behavior Safe-care adequate arrangements made  PRELIMINARY DISCHARGE PLAN: Return to previous living arrangement  PATIENT/FAMILY INVOLVEMENT: This treatment plan has been presented to and reviewed with the patient, Alyssa [name]. The patient has been given the opportunity to ask questions and make suggestions.  Lestine Rathke, RN 05/15/2024, 1:29 PM

## 2024-05-15 NOTE — Group Note (Signed)
 Date:  05/15/2024 Time:  9:24 PM  Group Topic/Focus:  Self Care:   The focus of this group is to help patients understand the importance of self-care in order to improve or restore emotional, physical, spiritual, interpersonal, and financial health.  MHT made introductions, explained the 15 minute rounds. Prepared patients for night checks and addressed expectations. Asked about preference of doors, explained if shut completely, they would hear them clicking during 15 minute checks. Explained MHT's needed to check breathing at night, but would try to be as quiet as possible. Discussed wellness plan for discharge. A member expressed concerns about basic needs. MHT informed of the 211 resource line. MHT called the 211 line to demonstrate how to find natural resources once discharged. MHT asked about food resources for Saturday. MHT was given 3 food resources for Saturday [facility] [phone] [employer] [employer] [phone] [place] [employer] [facility] [phone] MHT modeled how to find resources in the community. MHT answered questions and provided supportive counseling. MHT reviewed the steps of how to find resources once discharged back into the community.  Alyssa [name] Alyssa [name]   Participation Level:  Active  Participation Quality:  Appropriate  Affect:  Anxious  Cognitive:  Appropriate  Insight: Appropriate  Engagement in Group:  Engaged  Modes of Intervention:  Discussion  Additional Comments:    [name] 05/15/2024, 9:24 PM

## 2024-05-15 NOTE — Group Note (Signed)
 Recreation Therapy Group Note   Group Topic:Other  Group Date: 05/15/2024 Start Time: 1400 End Time: 1455 Facilitators: Deatrice Factor, LRT, CTRS Location: Dayroom  Group Description: Positive Affirmation Bingo. LRT and patients played multiple games of Bingo with music playing in the background. LRT and pts discussed what a positive affirmation is, the importance of speaking kindly to yourself, and the use of this as a coping skill.   Goal Area(s) Addressed: Patient will learn positive affirmations.  Patient will engage in recreation activity.  Patient will increase communication.    Affect/Mood: Anxious and Flat   Participation Level: Moderate   Participation Quality: Independent   Behavior: Withdrawn and Tearful   Speech/Thought Process: Coherent   Insight: Limited   Judgement: Limited   Modes of Intervention: Competitive Play   Patient Response to Interventions:  Receptive   Education Outcome:  In group clarification offered    Clinical Observations/Individualized Feedback: Alyssa [name] was mostly active in their participation of session activities and group discussion. Pt came to group after encouragement from LRT. Pt shared that she wanted her husband to pick her up. Pt began to play bingo and started to become tearful during. LRT got pt tissues and pt was able to calm herself down and continue playing. Pt minimally interacted with LRT and peers while present.    Plan: Continue to engage patient in RT group sessions 2-3x/week.   Deatrice Factor, LRT, CTRS 05/15/2024 4:46 PM

## 2024-05-15 NOTE — Plan of Care (Signed)
  Problem: Safety: Goal: Ability to disclose and discuss suicidal ideas will improve Outcome: Progressing Goal: Ability to identify and utilize support systems that promote safety will improve Outcome: Progressing   Problem: Self-Concept: Goal: Will verbalize positive feelings about self Outcome: Not Progressing Goal: Level of anxiety will decrease Outcome: Not Progressing

## 2024-05-15 NOTE — ED Provider Notes (Signed)
 Emergency Medicine Observation Re-evaluation Note  Alyssa [name] is a 73 y.o. female, seen on rounds today.  Pt initially presented to the ED for complaints of Psychiatric Evaluation Currently, the patient is resting  Physical Exam  BP 125/78 (BP Location: Right Arm)   Pulse 68   Temp 98.1 F (36.7 C) (Oral)   Resp 17   SpO2 100%  Physical Exam General: NAD Cardiac: RR Lungs: non-labored Psych: Calm   ED Course / MDM  EKG:EKG Interpretation Date/Time:  Thursday May 14 2024 13:54:44 EDT Ventricular Rate:  116 PR Interval:  163 QRS Duration:  89 QT Interval:  429 QTC Calculation: 597 R Axis:   71  Text Interpretation: Sinus tachycardia Multiple ventricular premature complexes artificat t wave depression Confirmed by Lowery Rue [phone]) on 05/14/2024 2:00:45 PM  I have reviewed the labs performed to date as well as medications administered while in observation.  Recent changes in the last 24 hours include accepted to [facility].  Plan  Current plan is for inpatient psych.    Rolinda Climes, DO 05/15/24 [phone]

## 2024-05-15 NOTE — Progress Notes (Signed)
   05/15/24 1515  Spiritual Encounters  Type of Visit Initial  Care provided to: Patient  Reason for visit Routine spiritual support  OnCall Visit No   Chaplain visited patient while on the Unit.  Patient shared that there had been a lot of changes in her life of late and it's been hard to adjust.  She's been feeling anxious and depressed and the stressors are impacting relationships.  Chaplain offered a compassionate presence and reflective listening.  Chaplain shared spiritual care services are available 24/7 since she's never been here before.  Patient seemed encouraged to hear that.  Chaplain will follow-up as needed/requested by patient and/or staff.   Rev. Rana M. Nolon Baxter, M.Div. Chaplain Resident [facility]

## 2024-05-15 NOTE — Group Note (Signed)
 Physical/Occupational Therapy Group Note  Group Topic: Pain Management and Coping   Group Date: 05/15/2024 Start Time: 1300 End Time: 1345 Facilitators: Hilma Lucks, OT   Group Description:  Group discussed impact of chronic/acute pain on safety and independence with functional tasks and impact on mental health.  Identified and discussed any previously learned or implemented strategies used.  Discussed and reviewed cognitive behavioral pain coping strategies to address/improve overall management of pain. Discussed relaxation, distraction techniques, cognitive restructuring, activity pacing/energy conservation, environment/home safety modifications, and role of sleep and sleep hygiene. Allowed time for questions and further discussion.   Therapeutic Goal(s):     Identify and discuss previously utilized pain coping strategies and implications of pain on function/well-being    Identify and discuss implementing new cognitive behavioral pain coping strategies into daily routines    Demonstrate understanding and performance of learned cognitive behavioral pain coping strategies.  Individual Participation: Pt came a few minutes late after talking with RN. Pt sat in a chair against the wall close to the group table. Engaged throughout, contributed to group discussion with prompting. Engaged both in the discussion, education, and activity portions of the session.   Participation Level: Active and Engaged   Participation Quality: Independent   Behavior: Alert, Appropriate, Attentive , and Calm   Speech/Thought Process: Coherent, Organized, and Relevant   Affect/Mood: Appropriate and Stable    Insight: Fair   Judgement: Fair   Modes of Intervention: Activity, Clarification, Discussion, Education, Exploration, Problem-solving, and Socialization  Patient Response to Interventions:  Attentive, Engaged, and Receptive   Plan: Continue to engage patient in PT/OT groups 1 - 2x/week. Jazara Swiney R., MPH,  MS, OTR/L ascom [phone] 05/15/24, 4:05 PM

## 2024-05-16 DIAGNOSIS — F332 Major depressive disorder, recurrent severe without psychotic features: Secondary | ICD-10-CM

## 2024-05-16 DIAGNOSIS — E038 Other specified hypothyroidism: Secondary | ICD-10-CM

## 2024-05-16 MED ORDER — CLONAZEPAM 0.25 MG PO TBDP: 0.2500 mg | ORAL_TABLET | Freq: Every day | ORAL | Status: DC

## 2024-05-16 MED ORDER — LEVOTHYROXINE SODIUM 137 MCG PO TABS
137.0000 ug | ORAL_TABLET | Freq: Every day | ORAL | Status: DC
  Administered 2024-05-17 – 2024-05-22 (×6): 137 ug via ORAL
  Filled 2024-05-16 (×6): qty 1

## 2024-05-16 MED ORDER — LORAZEPAM 1 MG PO TABS: 1.0000 mg | ORAL_TABLET | Freq: Four times a day (QID) | ORAL | Status: DC | PRN

## 2024-05-16 MED ORDER — CLONAZEPAM 0.25 MG PO TBDP
0.5000 mg | ORAL_TABLET | Freq: Two times a day (BID) | ORAL | Status: DC
  Administered 2024-05-16 – 2024-05-22 (×12): 0.5 mg via ORAL
  Filled 2024-05-16 (×12): qty 2

## 2024-05-16 MED ORDER — SERTRALINE HCL 25 MG PO TABS
25.0000 mg | ORAL_TABLET | Freq: Every day | ORAL | Status: DC
  Administered 2024-05-16 – 2024-05-22 (×7): 25 mg via ORAL
  Filled 2024-05-16 (×7): qty 1

## 2024-05-16 NOTE — Progress Notes (Signed)
 I was contacted by Dr. Charissa Compton regarding and elevated TSH on this patient (55). I have ordered TSH with thyroid  panel. Will consult if necessary. If the results point to a non-thyroidal illness picture will recommend recheck TSH and thyroid  panel in 6 weeks.

## 2024-05-16 NOTE — Group Note (Signed)
 Date:  05/16/2024 Time:  8:43 PM  Group Topic/Focus:  Overcoming Stress:   The focus of this group is to define stress and help patients assess their triggers.    Participation Level:  Active  Participation Quality:  Appropriate  Affect:  Appropriate  Cognitive:  Appropriate  Insight: Good  Engagement in Group:  Engaged  Modes of Intervention:  Discussion  Additional Comments:    Lynette Saras 05/16/2024, 8:43 PM
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
## 2024-05-16 NOTE — Progress Notes (Signed)
   05/15/24 2200  Psych Admission Type (Psych Patients Only)  Admission Status Involuntary  Psychosocial Assessment  Patient Complaints Anxiety  Eye Contact Brief  Facial Expression Flat  Affect Anxious  Speech Soft;Logical/coherent  Motor Activity Fidgety  Appearance/Hygiene In scrubs  Behavior Characteristics Cooperative  Mood Anxious;Depressed  Thought Process  Coherency WDL  Content WDL  Delusions None reported or observed  Perception WDL  Hallucination None reported or observed  Judgment UTA  Confusion WDL  Danger to Self  Current suicidal ideation? Denies  Danger to Others  Danger to Others None reported or observed   Patient compliant with medications, present in milieu.  Denies SI/HI/AVH.  Resting in bed at this time with eyes closed respirations remain even and unlabored.  Safety checks remain in place.

## 2024-05-16 NOTE — Group Note (Signed)
 Date:  05/16/2024 Time:  10:49 AM  Group Topic/Focus:  Movement Therapy    Participation Level:  Did Not Attend    Merton Abts 05/16/2024, 10:49 AM

## 2024-05-16 NOTE — Progress Notes (Signed)
 I assumed care for Ms Marcus at about 08:00. She was resting in the day area, minimally verbal and appears very suspicious, prn hydroxyzine  offered with minimal effect, no falls, she denied any avh/hi/si, she ate less than 10% of both lunch and dinner, ensure provided. Pt is being monitored as ordered.    05/16/24 1204  Psych Admission Type (Psych Patients Only)  Admission Status Involuntary  Psychosocial Assessment  Patient Complaints Anxiety  Eye Contact Brief  Facial Expression Flat  Affect Anxious  Speech Soft  Interaction Minimal  Motor Activity Fidgety  Appearance/Hygiene In scrubs  Behavior Characteristics Cooperative  Mood Anxious  Thought Process  Coherency WDL  Content WDL  Delusions None reported or observed  Perception WDL  Hallucination None reported or observed  Judgment Limited  Confusion Mild  Danger to Self  Current suicidal ideation? Denies  Danger to Others  Danger to Others None reported or observed

## 2024-05-16 NOTE — Plan of Care (Signed)

## 2024-05-16 NOTE — Plan of Care (Signed)
  Problem: Education: Goal: Ability to state activities that reduce stress will improve Outcome: Progressing   Problem: Coping: Goal: Ability to identify and develop effective coping behavior will improve Outcome: Progressing   Problem: Self-Concept: Goal: Ability to identify factors that promote anxiety will improve Outcome: Progressing

## 2024-05-16 NOTE — Progress Notes (Signed)
   05/16/24 1000  Spiritual Encounters  Type of Visit Initial  Care provided to: Patient  Referral source Other (comment) (Spiritual Consult)  Reason for visit Routine spiritual support  OnCall Visit No   Chaplain visited with patient, Alyssa [name], who seemed a bit unsettled today. Still she welcomed me into her space. I listened as Zaida unpacked a little of her story. We prayed together and she was appreciative of our time together.   Clarence Croak [facility]  [phone]

## 2024-05-16 NOTE — Group Note (Deleted)
 Date:  05/16/2024 Time:  12:46 PM  Group Topic/Focus:  Developing a Wellness Toolbox:   The focus of this group is to help patients develop a "wellness toolbox" with skills and strategies to promote recovery upon discharge.     Participation Level:  {BHH PARTICIPATION QIHKV:42595}  Participation Quality:  {BHH PARTICIPATION QUALITY:22265}  Affect:  {BHH AFFECT:22266}  Cognitive:  {BHH COGNITIVE:22267}  Insight: {BHH Insight2:20797}  Engagement in Group:  {BHH ENGAGEMENT IN GROUP:22268}  Modes of Intervention:  {BHH MODES OF INTERVENTION:22269}  Additional Comments:  ***  Alyssa [name] Alyssa [name] 05/16/2024, 12:46 PM

## 2024-05-16 NOTE — B[facility] Suicide Risk Assessment (Signed)
 [facility] Admission Suicide Risk Assessment   Nursing information obtained from:  Patient Demographic factors:  Age 73 or older, Caucasian Current Mental Status:  NA Loss Factors:  NA Historical Factors:  NA Risk Reduction Factors:  Living with another person, especially a relative  Total Time spent with patient: 30 minutes Principal Problem: MDD (major depressive disorder) Diagnosis:  Principal Problem:   MDD (major depressive disorder)  Subjective Data: Alyssa [name] is a 73 y.o. female admitted: Presented to the ED on 05/14/2024  1:26 PM for  having episodes non stop hours of pacing, fidgeting with hands, saying words that do not make sense, and paranoia. 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 She meets criteria for inpatient psychiatric admission.  Continued Clinical Symptoms:  Alcohol Use Disorder Identification Test Final Score (AUDIT): 0 The "Alcohol Use Disorders Identification Test", Guidelines for Use in Primary Care, Second Edition.  World Science writer [facility]). Score between 0-7:  no or low risk or alcohol related problems. Score between 8-15:  moderate risk of alcohol related problems. Score between 16-19:  high risk of alcohol related problems. Score 20 or above:  warrants further diagnostic evaluation for alcohol dependence and treatment.   CLINICAL FACTORS:   Severe Anxiety and/or Agitation   Musculoskeletal: Strength & Muscle Tone: decreased Gait & Station: normal Patient leans: N/A  Psychiatric Specialty Exam:  Presentation  General  Appearance:  Appropriate for Environment; Casual  Eye Contact: Fleeting  Speech: Slow  Speech Volume: Decreased  Handedness: Right   Mood and Affect  Mood: Anxious; Dysphoric  Affect: Depressed   Thought Process  Thought Processes: Irrevelant  Descriptions of Associations:Intact  Orientation:Partial  Thought Content:Illogical  History of Schizophrenia/Schizoaffective disorder:No data recorded Duration of Psychotic Symptoms:No data recorded Hallucinations:Hallucinations: None  Ideas of Reference:None  Suicidal Thoughts:Suicidal Thoughts: No  Homicidal Thoughts:Homicidal Thoughts: No   Sensorium  Memory: Immediate Fair; Recent Fair; Remote Poor  Judgment: Impaired  Insight: Shallow   Executive Functions  Concentration: Poor  Attention Span: Poor  Recall: Poor  Fund of Knowledge: Fair  Language: Fair   Psychomotor Activity  Psychomotor Activity: Psychomotor Activity: Normal   Assets  Assets: Communication Skills; Desire for Improvement; Resilience; Social Support   Sleep  Sleep: Sleep: Poor    Physical Exam: Physical Exam Vitals and nursing note reviewed.    ROS Blood pressure 138/70, pulse (!) 59, temperature (!) 97.5 F (36.4 C), resp. rate 15, height 5\' 8"  (1.727 m), weight 67.8 kg, SpO2 99%. Body mass index is 22.73 kg/m.   COGNITIVE FEATURES THAT CONTRIBUTE TO RISK:  None    SUICIDE RISK:   Minimal: No identifiable suicidal ideation.  Patients presenting with no risk factors but with morbid ruminations; may be classified as minimal risk based on the severity of the depressive symptoms  PLAN OF CARE: Patient is admitted to [facility] psych unit with Q15 min safety monitoring. Multidisciplinary team approach is offered. Medication management; group/milieu therapy is offered.   I certify that inpatient services furnished can reasonably be expected to improve the patient's condition.   Shanteria Laye, MD 05/16/2024,  1:00 PM

## 2024-05-17 DIAGNOSIS — E890 Postprocedural hypothyroidism: Secondary | ICD-10-CM

## 2024-05-17 DIAGNOSIS — E039 Hypothyroidism, unspecified: Principal | ICD-10-CM | POA: Diagnosis present

## 2024-05-17 DIAGNOSIS — F332 Major depressive disorder, recurrent severe without psychotic features: Secondary | ICD-10-CM | POA: Diagnosis not present

## 2024-05-17 MED ORDER — ENSURE PLUS HIGH PROTEIN PO LIQD
237.0000 mL | Freq: Two times a day (BID) | ORAL | Status: DC
  Administered 2024-05-17 – 2024-05-22 (×9): 237 mL via ORAL

## 2024-05-17 NOTE — Progress Notes (Signed)
 Patient is pleasant and cooperative.  Anxious affect.  Endorses anxiety and depression.  Denies SI/HI and AVH.  Denies pain.   Compliant with scheduled medications.  15 min checks in place for safety.  Patient is restless.  Declined to eat breakfast this morning.   Patient ate 90% of lunch and 30% of dinner.

## 2024-05-17 NOTE — Progress Notes (Signed)
 [facility] MD Progress Note  05/17/2024 1:19 PM Alyssa [name]  MRN:  [id number]  Alyssa [name] is a 73 y.o. female admitted: Presented to the ED on 05/14/2024  1:26 PM for  having episodes non stop hours of pacing, fidgeting with hands, saying words that do not make sense, and paranoia. 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 She meets criteria for inpatient psychiatric admission.Patient is admitted to [facility] unit with Q15 min safety monitoring. Multidisciplinary team approach is offered. Medication management; group/milieu therapy is offered.   Subjective:  Chart reviewed, case discussed in multidisciplinary meeting, patient seen during rounds.  Today on interview patient is noted to be walking outside in the backyard with the staff and other patients.  She is able to acknowledge that she is in a hospital in [place].  Provider discussed her medications and informed her that Klonopin has been ordered back and educated her on adverse effects of abrupt cessation of medications.  She informed the provider that she stopped her Klonopin because she started noticing memory problems.  Provider educated her about the risk of abrupt cessation of benzos.  She reports irregularity being regulated today and denies SI/HI/plan.  Patient reports poor appetite but provider encouraged her to keep up with the nourishment.  Per nursing staff patient ate decent breakfast.  Will continue to monitor for any symptoms of catatonia and will consider shifting Klonopin to Ativan  if patient continues to display poor oral  intake.   Sleep: Fair  Appetite:  Poor  Past Psychiatric History: see h&P Family History:  Family History  Problem Relation Age of Onset   Hypertension Sister    Thyroid  cancer Brother    Skin cancer Brother    Diabetes Paternal Grandmother    Colon cancer Neg Hx    Breast cancer Neg Hx    Pancreatic cancer Neg Hx    Stomach cancer Neg Hx    Liver cancer Neg Hx    Esophageal cancer Neg Hx    Social History:  Social History   Substance and Sexual Activity  Alcohol Use Yes   Comment: Rare     Social History   Substance and Sexual Activity  Drug Use Never    Social History   Socioeconomic History   Marital status: Married    Spouse name: Not on file   Number of children: 0   Years of education: Not on file   Highest education level: Master's degree (e.g., MA, MS, MEng, MEd, MSW, MBA)  Occupational History   Occupation: retired, Chief Financial Officer  Tobacco Use   Smoking status: Never   Smokeless tobacco: Never  Vaping Use   Vaping status: Never Used  Substance and Sexual Activity   Alcohol use: Yes    Comment: Rare   Drug use: Never   Sexual activity: Not Currently  Other Topics Concern   Not on file  Social History Narrative   Moved from [place] June 2022   Lives w/ husband   Moved to stay close to her sister (Mrs Milon Aloe)   Social Drivers of Health   Financial Resource Strain: Low Risk  (01/27/2023)   Received from [facility], [facility]  Overall Financial Resource Strain (CARDIA)    Difficulty of Paying Living Expenses: Not hard at all  Food Insecurity: No Food Insecurity (05/15/2024)   Hunger Vital Sign    Worried About Running Out of Food in the Last Year: Never true    Ran Out of Food in the Last Year: Never true  Transportation Needs: No Transportation Needs (05/15/2024)   PRAPARE - Administrator, Civil Service (Medical): No    Lack of Transportation (Non-Medical): No  Physical Activity: Sufficiently Active (01/27/2023)   Received  [facility], [facility]   Exercise Vital Sign    Days of Exercise per Week: 5 days    Minutes of Exercise per Session: 30 min  Stress: No Stress Concern Present (01/27/2023)   Received from [facility], [facility] Carrier of Occupational Health - Occupational Stress Questionnaire    Feeling of Stress : Only a little  Social Connections: Socially Integrated (05/15/2024)   Social Connection and Isolation Panel [NHANES]    Frequency of Communication with Friends and Family: More than three times a week    Frequency of Social Gatherings with Friends and Family: Twice a week    Attends Religious Services: More than 4 times per year    Active Member of Clubs or Organizations: Yes    Attends Banker Meetings: 1 to 4 times per year    Marital Status: Married   Past Medical History:  Past Medical History:  Diagnosis Date   Anxiety and depression    Arthritis    Cervicogenic headache    Claustrophobia    Glaucoma    Hypertension    Lumbar radiculopathy    Postsurgical hypothyroidism    Spinal stenosis    Thyroid  cancer (HCC)    s/p thyroidectomy    Past Surgical History:  Procedure Laterality Date   FOOT SURGERY     LAMINOTOMY  04/03/2021   LEFT L3/4 LAMINOTOMY BILATERAL L3/4 MEDIAL FACETECTOMY   LUMBAR LAMINECTOMY  03/03/2018   THYROIDECTOMY  12/2004    Current Medications: Current Facility-Administered Medications  Medication Dose Route Frequency Provider Last Rate Last Admin   acetaminophen  (TYLENOL ) tablet 650 mg  650 mg Oral Q6H PRN Dorthea Gauze, NP       alum & mag hydroxide-simeth (MAALOX/MYLANTA) 200-200-20 MG/5ML suspension 30 mL  30 mL Oral Q4H PRN Dorthea Gauze, NP       clonazePAM (KLONOPIN) disintegrating tablet 0.5 mg  0.5 mg Oral BID Arihaan Bellucci, MD   0.5 mg at 05/17/24 2130   feeding supplement (ENSURE PLUS HIGH PROTEIN) liquid 237 mL  237 mL Oral BID BM Zorion Nims, MD       hydrOXYzine  (ATARAX ) tablet 50 mg  50 mg  Oral Q6H PRN Quiana Cobaugh, MD   50 mg at 05/17/24 0355   levothyroxine  (SYNTHROID ) tablet 137 mcg  137 mcg Oral Q0600 Kaidence Sant, MD   137 mcg at 05/17/24 8657   LORazepam  (ATIVAN ) tablet 1 mg  1 mg Oral Q6H PRN Aurelia Blotter, MD       magnesium  hydroxide (MILK OF MAGNESIA) suspension 30 mL  30 mL Oral Daily PRN Dorthea Gauze, NP       OLANZapine  (ZYPREXA ) injection 5 mg  5 mg Intramuscular TID PRN Dorthea Gauze, NP       OLANZapine  zydis (ZYPREXA ) disintegrating tablet 5 mg  5 mg Oral TID PRN Dorthea Gauze, NP       sertraline (ZOLOFT) tablet 25 mg  25 mg Oral Daily Lakeyshia Tuckerman, MD   25 mg at 05/17/24 1610   traZODone  (DESYREL ) tablet 50 mg  50 mg Oral QHS PRN Jarone Ostergaard, MD        Lab Results: No results found for this or any previous visit (from the past 48 hours).  Blood Alcohol level:  Lab Results  Component Value Date   [facility] <15 05/14/2024   ETH <10 02/09/2024    Metabolic Disorder Labs: Lab Results  Component Value Date   HGBA1C 5.1 06/07/2022   MPG 100 06/07/2022   No results found for: "PROLACTIN" Lab Results  Component Value Date   CHOL 309 (H) 06/07/2022   TRIG 62 06/07/2022   HDL 75 06/07/2022   CHOLHDL 4.1 06/07/2022   VLDL 12 06/07/2022   LDLCALC 222 (H) 06/07/2022   LDLCALC 125 (H) 11/27/2021    Physical Findings: AIMS:  , ,  ,  ,    CIWA:    COWS:      Psychiatric Specialty Exam:  Presentation  General Appearance:  Appropriate for Environment; Casual  Eye Contact: Fleeting  Speech: Slow  Speech Volume: Decreased    Mood and Affect  Mood: Anxious; Dysphoric  Affect: Depressed   Thought Process  Thought Processes: Irrevelant  Descriptions of Associations:Intact  Orientation:Partial  Thought Content:Illogical  Hallucinations:Hallucinations: None  Ideas of Reference:None  Suicidal Thoughts:Suicidal Thoughts: No  Homicidal Thoughts:Homicidal Thoughts: No   Sensorium  Memory: Immediate Fair; Recent  Fair; Remote Poor  Judgment: Impaired  Insight: Shallow   Executive Functions  Concentration: Poor  Attention Span: Poor  Recall: Poor  Fund of Knowledge: Fair  Language: Fair   Psychomotor Activity  Psychomotor Activity: Psychomotor Activity: Normal  Musculoskeletal: Strength & Muscle Tone: within normal limits Gait & Station: normal Assets  Assets: Manufacturing systems engineer; Desire for Improvement; Resilience; Social Support    Physical Exam: Physical Exam ROS Blood pressure 135/83, pulse 73, temperature 98.1 F (36.7 C), resp. rate 16, height 5\' 8"  (1.727 m), weight 67.8 kg, SpO2 100%. Body mass index is 22.73 kg/m.  Diagnosis: Principal Problem:   Hypothyroidism Active Problems:   MDD (major depressive disorder) GAD Clinical Decision Making: Patient is admitted for worsening anxiety, confusion in the context of abrupt stopping of her medications including benzos, Synthroid  with her TSH elevated to 52 probably contributing to her psychiatric manifestation.  Patient needs ongoing inpatient hospitalization for further stabilization   Treatment Plan Summary:   Safety and Monitoring:             -- Voluntary admission to inpatient psychiatric unit for safety, stabilization and treatment             -- Daily contact with patient to assess and evaluate symptoms and progress in treatment             -- Patient's case to be discussed in multi-disciplinary team meeting             -- Observation Level: q15 minute checks             -- Vital signs:  q12 hours             -- Precautions: suicide, elopement, and assault   2. Psychiatric Diagnoses and Treatment:               Klonopin 0.5 mg twice daily prevent benzo withdrawal seizures/delirium  Zoloft 25 mg to help with depression/anxiety Restarted home levothyroxine  and follow hospitalist regarding elevated TSH of 52 Will  hold off Seroquel  at this time to minimize multiple sedative medications -- The  risks/benefits/side-effects/alternatives to this medication were discussed in detail with the patient and time was given for questions. The patient consents to medication trial.                -- Metabolic profile and EKG monitoring obtained while on an atypical antipsychotic (BMI: Lipid Panel: HbgA1c: QTc:)              -- Encouraged patient to participate in unit milieu and in scheduled group therapies                            3. Medical Issues Being Addressed:   Restarted home levothyroxine  and follow hospitalist regarding elevated TSH of 52  4. Discharge Planning:   -- Social work and case management to assist with discharge planning and identification of hospital follow-up needs prior to discharge  -- Estimated LOS: 3-4 days  Daleyza Gadomski, MD 05/17/2024, 1:19 PM

## 2024-05-17 NOTE — Plan of Care (Signed)

## 2024-05-17 NOTE — Progress Notes (Signed)
   05/17/24 0645  15 Minute Checks  Location Bedroom  Visual Appearance Calm  Behavior Sleeping  Sleep (Behavioral Health Patients Only)  Calculate sleep? (Click Yes once per 24 hr at 0600 safety check) Yes  Documented sleep last 24 hours 5

## 2024-05-17 NOTE — Consult Note (Signed)
 Initial Consultation Note   Patient: Alyssa [name] UUV:[id number] DOB: 05-28-1951 PCP: System, Provider Not In DOA: 05/15/2024 DOS: the patient was seen and examined on 05/17/2024 Primary service: Aurelia Blotter, MD  Referring physician: Dr. Belvie Boyers Reason for consult: Abnormal TSH  Assessment/Plan: Assessment and Plan: Hypothyroidism The patient has known hypothyroidism for which she has been prescribed synthroid  137 mcg daily. However, it appears that the last time that the patient's TSH has been in range was in 2022 when it was 3.87 and 5.06. In 11/2021 it was 9.15. 06/07/2022 - 26.360, and on 05/14/2024 it was 52.018 with T4 of 0.52.   The patient admits that she was not taking her medications as prescribed prior to presentation for her psychiatric symptoms. She further admits that she doesn't always take her synthroid  as prescribed. I have cautioned her that low thyroid  is very serious and can lead to a range of problems from dry skin, to loss of hair and anemia, to coma and death. She states that she will be more reliable with taking her medications.   It is unknown if the patient's current dosage of 137 mcg daily is appropriate or not as she has not been taking is prior to admission. I believe that the appropriate course is to return the patient to her prior dose and to recheck a TSH and FT4 in 6 weeks by her PCP. Any change to her dose should be made thoughtfully, slowly, and with good follow up.  TRH will sign off at present, please call us  again when needed.  HPI: Alyssa [name] is a 73 y.o. female with past medical history of hypothyroidism, multifocal papillary thyroid  cancer s/p XRT and surg 2006, HTN, L lumbar radiculopathy, SI joint disease, chronic allergic conjunctivitis, claustrophobia, and glaucoma. She was admitted to the Geropsychiciatric unit on 05/16/2024 with major depressive disorder in the setting of bipolar disorder with MDD for which she takes Klonopin, Lexapro , Ativan ,  Seroquel , and trazodone . TRH was consulted for evaluation of her hypothyroidism after her TSH returned quite high ( in the 50's).   Review of Systems: As mentioned in the history of present illness. All other systems reviewed and are negative. Past Medical History:  Diagnosis Date   Anxiety and depression    Arthritis    Cervicogenic headache    Claustrophobia    Glaucoma    Hypertension    Lumbar radiculopathy    Postsurgical hypothyroidism    Spinal stenosis    Thyroid  cancer [facility])    s/p thyroidectomy   Past Surgical History:  Procedure Laterality Date   FOOT SURGERY     LAMINOTOMY  04/03/2021   LEFT L3/4 LAMINOTOMY BILATERAL L3/4 MEDIAL FACETECTOMY   LUMBAR LAMINECTOMY  03/03/2018   THYROIDECTOMY  12/2004   Social History:  reports that she has never smoked. She has never used smokeless tobacco. She reports current alcohol use. She reports that she does not use drugs.  Allergies  Allergen Reactions   Amoxicillin Hives   Clavulanic Acid Other (See Comments)    Family is unaware of this     Family History  Problem Relation Age of Onset   Hypertension Sister    Thyroid  cancer Brother    Skin cancer Brother    Diabetes Paternal Grandmother    Colon cancer Neg Hx    Breast cancer Neg Hx    Pancreatic cancer Neg Hx    Stomach cancer Neg Hx    Liver cancer Neg Hx    Esophageal cancer  Neg Hx     Prior to Admission medications   Medication Sig Start Date End Date Taking? Authorizing Provider  clonazePAM (KLONOPIN) 0.5 MG tablet Take 0.5 mg by mouth daily as needed for anxiety.    [provider]  escitalopram  (LEXAPRO ) 10 MG tablet Take 1 tablet (10 mg total) by mouth daily. 09/07/22   Wray Heady, MD  Estradiol  10 MCG TABS vaginal tablet Place 1 tablet (10 mcg total) vaginally 2 (two) times a week. 12/13/22   Granville Layer, MD  levothyroxine  (SYNTHROID ) 137 MCG tablet Take 137 mcg by mouth daily before breakfast. 10/23/22   [provider]   LORazepam  (ATIVAN ) 1 MG tablet Take 1 tablet (1 mg total) by mouth daily as needed for anxiety. 09/07/22   Wray Heady, MD  QUEtiapine  (SEROQUEL ) 100 MG tablet Take 1 tablet (100 mg total) by mouth at bedtime. 09/07/22   Wray Heady, MD  Travoprost, BAK Free, (TRAVATAN) 0.004 % SOLN ophthalmic solution Place 1 drop into both eyes at bedtime.    [provider]  traZODone  (DESYREL ) 150 MG tablet Take 150 mg by mouth at bedtime.    [provider]  traZODone  (DESYREL ) 50 MG tablet Take 1 tablet (50 mg total) by mouth at bedtime as needed for sleep. Patient not taking: Reported on 05/14/2024 08/02/23   Mozingo, Regina Nattalie, NP  buPROPion  (WELLBUTRIN  XL) 150 MG 24 hr tablet Take 150 mg by mouth every morning. 07/13/22 09/07/22  [provider]  OLANZapine  zydis (ZYPREXA ) 5 MG disintegrating tablet Take 0.5 tablets (2.5 mg total) by mouth 2 (two) times daily. Patient not taking: Reported on 08/18/2022 06/07/22 09/07/22  Georges Kings, DO    Physical Exam: Vitals:   05/15/24 1933 05/16/24 0727 05/16/24 1925 05/17/24 0731  BP: 124/87 138/70 (!) 150/74 135/83  Pulse: 76 (!) 59 68 73  Resp: 18 15 18 16   Temp: 97.9 F (36.6 C) (!) 97.5 F (36.4 C) (!) 97.1 F (36.2 C) 98.1 F (36.7 C)  TempSrc:      SpO2: 98% 99% 97% 100%  Weight:      Height:       Exam:  Constitutional:  The patient is awake, alert, and oriented x 3. No acute distress. Eyes:  pupils and irises appear normal Normal lids and conjunctivae ENMT:  grossly normal hearing  Lips appear normal external ears, nose appear normal Oropharynx: mucosa, tongue,posterior pharynx appear normal Neck:  neck appears normal, no masses, normal ROM, supple no thyromegaly Respiratory:  No increased work of breathing. No wheezes, rales, or rhonchi No tactile fremitus Cardiovascular:  Regular rate and rhythm No murmurs, ectopy, or gallups. No lateral PMI. No thrills. Abdomen:  Abdomen is soft, non-tender,  non-distended No hernias, masses, or organomegaly Normoactive bowel sounds.  Musculoskeletal:  No cyanosis, clubbing, or edema Skin:  No rashes, lesions, ulcers palpation of skin: no induration or nodules Neurologic:  CN 2-12 intact Sensation all 4 extremities intact Psychiatric:  Mental status Mood, affect appropriate Orientation to person, place, time  judgment and insight appear intact  Data Reviewed:   TSH, FT4  Family Communication: None available Thank you very much for involving us  in the care of your patient.  Author: Ivory Maduro, DO 05/17/2024 9:43 AM  For on call review [email or web address].

## 2024-05-17 NOTE — Group Note (Signed)
 Date:  05/17/2024 Time:  10:47 PM  Group Topic/Focus:  Wrap-Up Group:   The focus of this group is to help patients review their daily goal of treatment and discuss progress on daily workbooks.    Participation Level:  Active  Participation Quality:  Appropriate  Affect:  Appropriate  Cognitive:  Appropriate  Insight: Appropriate  Engagement in Group:  Engaged  Modes of Intervention:  Discussion  Additional Comments:    Alyssa [name] 05/17/2024, 10:47 PM

## 2024-05-17 NOTE — Progress Notes (Signed)
 Pt received in the dayroom. Alert and oriented. Slow to responding to questions when asked. Fidgety and restless. Pt is paranoid and suspicious. Visited with spouse. Appetite poor. Consume 10% of snack. Fluids offered but refused. Refused PRNs for insomnia and anxiety @ 0145. Denies SI/HI/AVH. No c/o pain/discomfort noted.   0400-Pt remains anxious and restless. Mumbled speech noted. walking up/down the hall. PRN given for anxiety with min effect. Pt slept at short intervals throughout the night. Q 15 min checks maintained for safety.   05/16/24 2100  Psych Admission Type (Psych Patients Only)  Admission Status Involuntary  Psychosocial Assessment  Patient Complaints Anxiety  Eye Contact Brief  Facial Expression Anxious  Affect Anxious  Speech Other (Comment) (selectively mute)  Interaction Minimal  Motor Activity Fidgety  Appearance/Hygiene In scrubs  Behavior Characteristics Anxious  Mood Anxious  Thought Process  Coherency WDL  Content WDL  Delusions None reported or observed  Perception WDL  Hallucination None reported or observed  Judgment Impaired  Confusion Mild  Danger to Self  Current suicidal ideation? Denies

## 2024-05-17 NOTE — Assessment & Plan Note (Signed)
 The patient has known hypothyroidism for which she has been prescribed synthroid  137 mcg daily. However, it appears that the last time that the patient's TSH has been in range was in 2022 when it was 3.87 and 5.06. In 11/2021 it was 9.15. 06/07/2022 - 26.360, and on 05/14/2024 it was 52.018 with T4 of 0.52.   The patient admits that she was not taking her medications as prescribed prior to presentation for her psychiatric symptoms. She further admits that she doesn't always take her synthroid  as prescribed. I have cautioned her that low thyroid  is very serious and can lead to a range of problems from dry skin, to loss of hair and anemia, to coma and death. She states that she will be more reliable with taking her medications.   It is unknown if the patient's current dosage of 137 mcg daily is appropriate or not as she has not been taking is prior to admission. I believe that the appropriate course is to return the patient to her prior dose and to recheck a TSH and FT4 in 6 weeks by her PCP. Any change to her dose should be made thoughtfully, slowly, and with good follow up.

## 2024-05-17 NOTE — Group Note (Signed)
 Date:  05/17/2024 Time:  10:52 AM  Group Topic/Focus:  Outdoor, and fresh air therapy    Participation Level:  Did Not Attend    Merton Abts 05/17/2024, 10:52 AM

## 2024-05-17 NOTE — Plan of Care (Signed)
  Problem: Self-Concept: Goal: Level of anxiety will decrease Outcome: Not Progressing   Problem: Activity: Goal: Interest or engagement in leisure activities will improve Outcome: Not Progressing

## 2024-05-18 DIAGNOSIS — F332 Major depressive disorder, recurrent severe without psychotic features: Secondary | ICD-10-CM

## 2024-05-18 LAB — T3, FREE: T3, Free: 0.7 pg/mL — ABNORMAL LOW (ref 2.0–4.4)

## 2024-05-18 NOTE — Group Note (Signed)
 Recreation Therapy Group Note   Group Topic:Stress Management  Group Date: 05/18/2024 Start Time: 1500 End Time: 1545 Facilitators: Deatrice Factor, LRT, CTRS Location: Dayroom  Group Description: PMR (Progressive Muscle Relaxation). LRT educates patients on what PMR is and the benefits that come from it. Patients are asked to sit with their feet flat on the floor while sitting up and all the way back in their chair, if possible. LRT and pts follow a prompt through a speaker that requires you to tense and release different muscles in their body and focus on their breathing. During session, lights are off and soft music is being played. Pts are given a stress ball to use if needed.   Goal Area(s) Addressed:  Patients will be able to describe progressive muscle relaxation.  Patient will practice using relaxation technique. Patient will identify a new coping skill.  Patient will follow multistep directions to reduce anxiety and stress.   Affect/Mood: Appropriate   Participation Level: Active and Engaged   Participation Quality: Independent   Behavior: Calm and Cooperative   Speech/Thought Process: Coherent   Insight: Good   Judgement: Good   Modes of Intervention: Activity, Education, and Exploration   Patient Response to Interventions:  Attentive, Engaged, Interested , and Receptive   Education Outcome:  Acknowledges education   Clinical Observations/Individualized Feedback: Alyssa [name] was active in their participation of session activities and group discussion. Pt shared that she was not familiar with PMR. Pt completed all exercises as shown. Pt interacted well with LRT and peers duration of session.    Plan: Continue to engage patient in RT group sessions 2-3x/week.   Deatrice Factor, LRT, CTRS 05/18/2024 5:04 PM

## 2024-05-18 NOTE — Plan of Care (Signed)
 Patient alert and oriented. Denies SI, HI, AVH and pain. Scheduled medications administered per MAR. Support and encouragement provided. Routine safety checks conducted every 15 minutes. Patient informed this nurse w/concerns of discharge "I would like to have a compassion discharge, so I can go home and take care of my very ill spouse". No adverse drug reactions noted. Patient verbally contracts for safety at this time. Patient interacts well with others on the unit.  Patient remains safe at this time.  Problem: Education: Goal: Ability to state activities that reduce stress will improve 05/18/2024 0114 by Jasper Messenger, RN Outcome: Progressing 05/18/2024 0114 by Kelly-Savage, Amillya Chavira T, RN Outcome: Progressing   Problem: Coping: Goal: Ability to identify and develop effective coping behavior will improve 05/18/2024 0114 by Kelly-Savage, Hadyn Azer T, RN Outcome: Not Progressing 05/18/2024 0114 by Kelly-Savage, Clema Skousen T, RN Outcome: Progressing   Problem: Health Behavior/Discharge Planning: Goal: Ability to make decisions will improve 05/18/2024 0114 by Jasper Messenger, RN Outcome: Not Progressing 05/18/2024 0114 by Kelly-Savage, Maveryck Bahri T, RN Outcome: Progressing Goal: Compliance with therapeutic regimen will improve 05/18/2024 0114 by Kelly-Savage, Meghana Tullo T, RN Outcome: Not Progressing 05/18/2024 0114 by Kelly-Savage, Mulan Adan T, RN Outcome: Progressing

## 2024-05-18 NOTE — Progress Notes (Signed)
 Patient is an involuntary admission to [facility] for MDD with paranoia.  Patient is a little better today but still has a hard time communicating needs.  Asked this morning for a "compassion discharge" from our unit so she could take care of her husband.  Advised her to talk to the doctor about that.  Denies SI, HI, AVH, anxiety and depression. Was not eating at home and is on Ensure - agreed with reluctance to take the morning one but never drank it.  Then refused the afternoon ensure.  Will continue to monitor.

## 2024-05-18 NOTE — Progress Notes (Signed)
   05/18/24 0610  15 Minute Checks  Location Bedroom  Visual Appearance Calm  Behavior Sleeping  Sleep (Behavioral Health Patients Only)  Calculate sleep? (Click Yes once per 24 hr at 0600 safety check) Yes  Documented sleep last 24 hours 9

## 2024-05-18 NOTE — Progress Notes (Signed)
 [facility] MD Progress Note  05/18/2024 11:45 AM SITARA CASHWELL  MRN:  [id number]  Alyssa [name] is a 73 y.o. female admitted: Presented to the ED on 05/14/2024  1:26 PM for  having episodes non stop hours of pacing, fidgeting with hands, saying words that do not make sense, and paranoia. 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 She meets criteria for inpatient psychiatric admission.Patient is admitted to [facility] unit with Q15 min safety monitoring. Multidisciplinary team approach is offered. Medication management; group/milieu therapy is offered.   Subjective:  Chart reviewed, case discussed in multidisciplinary meeting, patient seen during rounds.  And is noted to be resting in her room.  She is able to acknowledge that it was her anxiety that brought her to the hospital.  She met with the treatment team today and reports wanting to feel better.  She lacks insight into her mental health problems and states that she needs to take care of her husband and requesting to be discharged home.  Provider explained the significant concerns of patient stopping all her treatment including Synthroid  that is life-threatening as she had thyroidectomy due to cancer, stopped eating 4 days in the house, going through catatonia as she stopped her benzos abruptly.  Patient seems to have minimal understanding of the seriousness of her clinical presentation and continues to request to be sent home.  Per nursing patient is taking her medications and had fair appetite as she ate most of her breakfast and lunch.  Patient  is noted to be participating in groups and even outdoor activities.  We will continue to monitor for any signs of catatonia at this time Sleep: Fair  Appetite:  Poor  Past Psychiatric History: see h&P Family History:  Family History  Problem Relation Age of Onset   Hypertension Sister    Thyroid  cancer Brother    Skin cancer Brother    Diabetes Paternal Grandmother    Colon cancer Neg Hx    Breast cancer Neg Hx    Pancreatic cancer Neg Hx    Stomach cancer Neg Hx    Liver cancer Neg Hx    Esophageal cancer Neg Hx    Social History:  Social History   Substance and Sexual Activity  Alcohol Use Yes   Comment: Rare     Social History   Substance and Sexual Activity  Drug Use Never    Social History   Socioeconomic History   Marital status: Married    Spouse name: Not on file   Number of children: 0   Years of education: Not on file   Highest education level: Master's degree (e.g., MA, MS, MEng, MEd, MSW, MBA)  Occupational History   Occupation: retired, Chief Financial Officer  Tobacco Use   Smoking status: Never   Smokeless tobacco: Never  Vaping Use   Vaping status: Never Used  Substance and Sexual Activity   Alcohol use: Yes    Comment: Rare   Drug use: Never   Sexual activity: Not Currently  Other Topics Concern   Not on file  Social History Narrative   Moved from [place] June 2022   Lives w/ husband   Moved to  stay close to her sister (Mrs Milon Aloe)   Social Drivers of Health   Financial Resource Strain: Low Risk  (01/27/2023)   Received from [facility], [facility]   Overall Financial Resource Strain (CARDIA)    Difficulty of Paying Living Expenses: Not hard at all  Food Insecurity: No Food Insecurity (05/15/2024)   Hunger Vital Sign    Worried About Running Out of Food in the Last Year: Never true    Ran Out of Food in the Last Year: Never true  Transportation Needs: No Transportation Needs (05/15/2024)   PRAPARE - Administrator, Civil Service  (Medical): No    Lack of Transportation (Non-Medical): No  Physical Activity: Sufficiently Active (01/27/2023)   Received from [facility], [facility]   Exercise Vital Sign    Days of Exercise per Week: 5 days    Minutes of Exercise per Session: 30 min  Stress: No Stress Concern Present (01/27/2023)   Received from [facility], [facility] Occupational [facility] - Occupational Stress Questionnaire    Feeling of Stress : Only a little  Social Connections: Socially Integrated (05/15/2024)   Social Connection and Isolation Panel [NHANES]    Frequency of Communication with Friends and Family: More than three times a week    Frequency of Social Gatherings with Friends and Family: Twice a week    Attends Religious Services: More than 4 times per year    Active Member of Clubs or Organizations: Yes    Attends Banker Meetings: 1 to 4 times per year    Marital Status: Married   Past Medical History:  Past Medical History:  Diagnosis Date   Anxiety and depression    Arthritis    Cervicogenic headache    Claustrophobia    Glaucoma    Hypertension    Lumbar radiculopathy    Postsurgical hypothyroidism    Spinal stenosis    Thyroid  cancer (HCC)    s/p thyroidectomy    Past Surgical History:  Procedure Laterality Date   FOOT SURGERY     LAMINOTOMY  04/03/2021   LEFT L3/4 LAMINOTOMY BILATERAL L3/4 MEDIAL FACETECTOMY   LUMBAR LAMINECTOMY  03/03/2018   THYROIDECTOMY  12/2004    Current Medications: Current Facility-Administered Medications  Medication Dose Route Frequency Provider Last Rate Last Admin   acetaminophen  (TYLENOL ) tablet 650 mg  650 mg Oral Q6H PRN Dorthea Gauze, NP       alum & mag hydroxide-simeth (MAALOX/MYLANTA) 200-200-20 MG/5ML suspension 30 mL  30 mL Oral Q4H PRN Dorthea Gauze, NP       clonazePAM (KLONOPIN) disintegrating tablet 0.5 mg  0.5 mg Oral BID Ladavion Savitz, MD   0.5 mg at 05/18/24 1610   feeding supplement  (ENSURE PLUS HIGH PROTEIN) liquid 237 mL  237 mL Oral BID BM Lorelie Biermann, MD   237 mL at 05/18/24 0930   hydrOXYzine  (ATARAX ) tablet 50 mg  50 mg Oral Q6H PRN Gwen Edler, MD   50 mg at 05/17/24 0355   levothyroxine  (SYNTHROID ) tablet 137 mcg  137 mcg Oral Q0600 Marcin Holte, MD   137 mcg at 05/18/24 0649   LORazepam  (ATIVAN ) tablet 1 mg  1 mg Oral Q6H PRN Aurelia Blotter, MD       magnesium  hydroxide (MILK OF MAGNESIA) suspension 30 mL  30 mL Oral Daily PRN Dorthea Gauze, NP       OLANZapine  (ZYPREXA ) injection 5 mg  5 mg Intramuscular TID PRN Broadus Canes,  Joanette Moynahan, NP       OLANZapine  zydis (ZYPREXA ) disintegrating tablet 5 mg  5 mg Oral TID PRN Dorthea Gauze, NP       sertraline (ZOLOFT) tablet 25 mg  25 mg Oral Daily Marguis Mathieson, MD   25 mg at 05/18/24 1610   traZODone  (DESYREL ) tablet 50 mg  50 mg Oral QHS PRN Hades Mathew, MD   50 mg at 05/17/24 2127    Lab Results:  Results for orders placed or performed during the hospital encounter of 05/15/24 (from the past 48 hours)  T3, free     Status: Abnormal   Collection Time: 05/16/24  2:19 PM  Result Value Ref Range   T3, Free 0.7 (L) 2.0 - 4.4 pg/mL    Comment: (NOTE) Performed At: [facility] [address] [place], [place] [id number] Pearlean Botts MD JX:[id number]     Blood Alcohol level:  Lab Results  Component Value Date   [facility] <15 05/14/2024   ETH <10 02/09/2024    Metabolic Disorder Labs: Lab Results  Component Value Date   HGBA1C 5.1 06/07/2022   MPG 100 06/07/2022   No results found for: "PROLACTIN" Lab Results  Component Value Date   CHOL 309 (H) 06/07/2022   TRIG 62 06/07/2022   HDL 75 06/07/2022   CHOLHDL 4.1 06/07/2022   VLDL 12 06/07/2022   LDLCALC 222 (H) 06/07/2022   LDLCALC 125 (H) 11/27/2021    Physical Findings: AIMS:  , ,  ,  ,    CIWA:    COWS:      Psychiatric Specialty Exam:  Presentation  General Appearance:  Appropriate for Environment; Casual  Eye  Contact: Fleeting  Speech: Slow  Speech Volume: Decreased    Mood and Affect  Mood: Anxious; Dysphoric  Affect: Depressed   Thought Process  Thought Processes: Irrevelant  Descriptions of Associations:Intact  Orientation:Partial  Thought Content:Illogical  Hallucinations:denies  Ideas of Reference:None  Suicidal Thoughts:denies  Homicidal Thoughts:denies   Sensorium  Memory: Immediate Fair; Recent Fair; Remote Poor  Judgment: Impaired  Insight: Shallow   Executive Functions  Concentration: Poor  Attention Span: Poor  Recall: Poor  Fund of Knowledge: Fair  Language: Fair   Psychomotor Activity  Psychomotor Activity: normal  Musculoskeletal: Strength & Muscle Tone: within normal limits Gait & Station: normal Assets  Assets: Manufacturing systems engineer; Desire for Improvement; Resilience; Social Support    Physical Exam: Physical Exam ROS Blood pressure 139/76, pulse 66, temperature 97.6 F (36.4 C), resp. rate 16, height 5\' 8"  (1.727 m), weight 67.8 kg, SpO2 100%. Body mass index is 22.73 kg/m.  Diagnosis: Principal Problem:   Hypothyroidism Active Problems:   MDD (major depressive disorder) GAD Clinical Decision Making: Patient is admitted for worsening anxiety, confusion in the context of abrupt stopping of her medications including benzos, Synthroid  with her TSH elevated to 52 probably contributing to her psychiatric manifestation.  Patient needs ongoing inpatient hospitalization for further stabilization   Treatment Plan Summary:   Safety and Monitoring:             -- Voluntary admission to inpatient psychiatric unit for safety, stabilization and treatment             -- Daily contact with patient to assess and evaluate symptoms and progress in treatment             -- Patient's case to be discussed in multi-disciplinary team meeting             --  Observation Level: q15 minute checks             -- Vital signs:  q12  hours             -- Precautions: suicide, elopement, and assault   2. Psychiatric Diagnoses and Treatment:               Klonopin 0.5 mg twice daily prevent benzo withdrawal seizures/delirium  Zoloft 25 mg to help with depression/anxiety Restarted home levothyroxine  and follow hospitalist regarding elevated TSH of 52 Will hold off Seroquel  at this time to minimize multiple sedative medications -- The risks/benefits/side-effects/alternatives to this medication were discussed in detail with the patient and time was given for questions. The patient consents to medication trial.                -- Metabolic profile and EKG monitoring obtained while on an atypical antipsychotic (BMI: Lipid Panel: HbgA1c: QTc:)              -- Encouraged patient to participate in unit milieu and in scheduled group therapies                            3. Medical Issues Being Addressed:   Restarted home levothyroxine  and follow hospitalist regarding elevated TSH of 52  4. Discharge Planning:   -- Social work and case management to assist with discharge planning and identification of hospital follow-up needs prior to discharge  -- Estimated LOS: 3-4 days  Ripken Rekowski, MD 05/18/2024, 11:45 AM

## 2024-05-18 NOTE — Progress Notes (Signed)
   05/17/24 2127  Psych Admission Type (Psych Patients Only)  Admission Status Involuntary  Psychosocial Assessment  Patient Complaints Sadness;Other (Comment) ("I would like to have a compassion discharge, so I can go home and take care of my very ill spouse")  Eye Contact Fair  Facial Expression Sad  Affect Sad  Speech Logical/coherent;Soft  Interaction Minimal  Motor Activity Slow  Appearance/Hygiene In scrubs  Behavior Characteristics Cooperative;Guarded  Mood Sad  Aggressive Behavior  Effect No apparent injury  Thought Process  Coherency WDL  Content WDL  Delusions None reported or observed  Perception WDL  Hallucination None reported or observed  Judgment Impaired  Confusion Mild  Danger to Self  Current suicidal ideation? Denies  Danger to Others  Danger to Others None reported or observed

## 2024-05-18 NOTE — BH IP Treatment Plan (Signed)
 Interdisciplinary Treatment and Diagnostic Plan Update  05/18/2024 Time of Session: 1:05 PM  Alyssa [name] MRN: [id number]  Principal Diagnosis: Hypothyroidism  Secondary Diagnoses: Principal Problem:   Hypothyroidism Active Problems:   MDD (major depressive disorder)   Current Medications:  Current Facility-Administered Medications  Medication Dose Route Frequency Provider Last Rate Last Admin   acetaminophen  (TYLENOL ) tablet 650 mg  650 mg Oral Q6H PRN Dorthea Gauze, NP       alum & mag hydroxide-simeth (MAALOX/MYLANTA) 200-200-20 MG/5ML suspension 30 mL  30 mL Oral Q4H PRN Dorthea Gauze, NP       clonazePAM (KLONOPIN) disintegrating tablet 0.5 mg  0.5 mg Oral BID Jadapalle, Sree, MD   0.5 mg at 05/18/24 6578   feeding supplement (ENSURE PLUS HIGH PROTEIN) liquid 237 mL  237 mL Oral BID BM Jadapalle, Sree, MD   237 mL at 05/18/24 0930   hydrOXYzine  (ATARAX ) tablet 50 mg  50 mg Oral Q6H PRN Jadapalle, Sree, MD   50 mg at 05/17/24 0355   levothyroxine  (SYNTHROID ) tablet 137 mcg  137 mcg Oral Q0600 Jadapalle, Sree, MD   137 mcg at 05/18/24 4696   LORazepam  (ATIVAN ) tablet 1 mg  1 mg Oral Q6H PRN Jadapalle, Sree, MD       magnesium  hydroxide (MILK OF MAGNESIA) suspension 30 mL  30 mL Oral Daily PRN Dorthea Gauze, NP       OLANZapine  (ZYPREXA ) injection 5 mg  5 mg Intramuscular TID PRN Dorthea Gauze, NP       OLANZapine  zydis (ZYPREXA ) disintegrating tablet 5 mg  5 mg Oral TID PRN Dorthea Gauze, NP       sertraline (ZOLOFT) tablet 25 mg  25 mg Oral Daily Jadapalle, Sree, MD   25 mg at 05/18/24 2952   traZODone  (DESYREL ) tablet 50 mg  50 mg Oral QHS PRN Jadapalle, Sree, MD   50 mg at 05/17/24 2127   PTA Medications: Medications Prior to Admission  Medication Sig Dispense Refill Last Dose/Taking   clonazePAM (KLONOPIN) 0.5 MG tablet Take 0.5 mg by mouth daily as needed for anxiety.      escitalopram  (LEXAPRO ) 10 MG tablet Take 1 tablet (10 mg total) by mouth daily. 30 tablet 0     Estradiol  10 MCG TABS vaginal tablet Place 1 tablet (10 mcg total) vaginally 2 (two) times a week. 30 tablet 12    levothyroxine  (SYNTHROID ) 137 MCG tablet Take 137 mcg by mouth daily before breakfast.      LORazepam  (ATIVAN ) 1 MG tablet Take 1 tablet (1 mg total) by mouth daily as needed for anxiety. 30 tablet 0    QUEtiapine  (SEROQUEL ) 100 MG tablet Take 1 tablet (100 mg total) by mouth at bedtime. 30 tablet 0    Travoprost, BAK Free, (TRAVATAN) 0.004 % SOLN ophthalmic solution Place 1 drop into both eyes at bedtime.      traZODone  (DESYREL ) 150 MG tablet Take 150 mg by mouth at bedtime.      traZODone  (DESYREL ) 50 MG tablet Take 1 tablet (50 mg total) by mouth at bedtime as needed for sleep. (Patient not taking: Reported on 05/14/2024) 30 tablet 0     Patient Stressors: Medication change or noncompliance    Patient Strengths: Ability for insight  Supportive family/friends   Treatment Modalities: Medication Management, Group therapy, Case management,  1 to 1 session with clinician, Psychoeducation, Recreational therapy.   Physician Treatment Plan for Primary Diagnosis: Hypothyroidism Long Term Goal(s): Improvement in symptoms so as ready for discharge  Short Term Goals: Ability to identify changes in lifestyle to reduce recurrence of condition will improve Ability to verbalize feelings will improve Ability to disclose and discuss suicidal ideas Ability to demonstrate self-control will improve Ability to identify and develop effective coping behaviors will improve Ability to maintain clinical measurements within normal limits will improve  Medication Management: Evaluate patient's response, side effects, and tolerance of medication regimen.  Therapeutic Interventions: 1 to 1 sessions, Unit Group sessions and Medication administration.  Evaluation of Outcomes: Progressing  Physician Treatment Plan for Secondary Diagnosis: Principal Problem:   Hypothyroidism Active Problems:   MDD  (major depressive disorder)  Long Term Goal(s): Improvement in symptoms so as ready for discharge   Short Term Goals: Ability to identify changes in lifestyle to reduce recurrence of condition will improve Ability to verbalize feelings will improve Ability to disclose and discuss suicidal ideas Ability to demonstrate self-control will improve Ability to identify and develop effective coping behaviors will improve Ability to maintain clinical measurements within normal limits will improve     Medication Management: Evaluate patient's response, side effects, and tolerance of medication regimen.  Therapeutic Interventions: 1 to 1 sessions, Unit Group sessions and Medication administration.  Evaluation of Outcomes: Progressing   RN Treatment Plan for Primary Diagnosis: Hypothyroidism Long Term Goal(s): Knowledge of disease and therapeutic regimen to maintain health will improve  Short Term Goals: Ability to remain free from injury will improve, Ability to verbalize frustration and anger appropriately will improve, Ability to demonstrate self-control, Ability to participate in decision making will improve, Ability to verbalize feelings will improve, Ability to disclose and discuss suicidal ideas, Ability to identify and develop effective coping behaviors will improve, and Compliance with prescribed medications will improve  Medication Management: RN will administer medications as ordered by provider, will assess and evaluate patient's response and provide education to patient for prescribed medication. RN will report any adverse and/or side effects to prescribing provider.  Therapeutic Interventions: 1 on 1 counseling sessions, Psychoeducation, Medication administration, Evaluate responses to treatment, Monitor vital signs and CBGs as ordered, Perform/monitor CIWA, COWS, AIMS and Fall Risk screenings as ordered, Perform wound care treatments as ordered.  Evaluation of Outcomes:  Progressing   LCSW Treatment Plan for Primary Diagnosis: Hypothyroidism Long Term Goal(s): Safe transition to appropriate next level of care at discharge, Engage patient in therapeutic group addressing interpersonal concerns.  Short Term Goals: Engage patient in aftercare planning with referrals and resources, Increase social support, Increase ability to appropriately verbalize feelings, Increase emotional regulation, Facilitate acceptance of mental health diagnosis and concerns, Facilitate patient progression through stages of change regarding substance use diagnoses and concerns, Identify triggers associated with mental health/substance abuse issues, and Increase skills for wellness and recovery  Therapeutic Interventions: Assess for all discharge needs, 1 to 1 time with Social worker, Explore available resources and support systems, Assess for adequacy in community support network, Educate family and significant other(s) on suicide prevention, Complete Psychosocial Assessment, Interpersonal group therapy.  Evaluation of Outcomes: Progressing   Progress in Treatment: Attending groups: Yes. and No. Participating in groups: Yes. and No. Taking medication as prescribed: Yes. Toleration medication: Yes. Family/Significant other contact made: Yes, individual(s) contacted:  Ellard Gunning  Patient understands diagnosis: Yes. Discussing patient identified problems/goals with staff: Yes. Medical problems stabilized or resolved: Yes. Denies suicidal/homicidal ideation: Yes. Issues/concerns per patient self-inventory: No. Other: None   New problem(s) identified: No, Describe:  None identified   New Short Term/Long Term Goal(s):elimination of symptoms of psychosis, medication management  for mood stabilization; elimination of SI thoughts; development of comprehensive mental wellnes plan.   Patient Goals:  "To feel better, more things to do"  Discharge Plan or Barriers:  CSW will assist with  appropriate discharge planning   Reason for Continuation of Hospitalization: Anxiety Medication stabilization  Estimated Length of Stay: 1 to 7 days   Last 3 [place] Suicide Severity Risk Score: Flowsheet Row Admission (Current) from 05/15/2024 in [facility] ED from 05/14/2024 in [facility] Emergency Department at [facility] ED from 02/09/2024 in [facility] Emergency Department at [facility]  C-SSRS RISK CATEGORY No Risk No Risk No Risk       Last [facility] 2/9 Scores:    09/07/2022   11:23 AM 06/21/2021    3:28 PM 06/05/2021   11:03 AM  Depression screen PHQ 2/9  Decreased Interest 1 0 0  Down, Depressed, Hopeless 1 0 0  PHQ - 2 Score 2 0 0  Altered sleeping 1 0 0  Tired, decreased energy 1 0 0  Change in appetite 0 0 0  Feeling bad or failure about yourself  1 0 0  Trouble concentrating 1 0 0  Moving slowly or fidgety/restless 1 0 0  Suicidal thoughts 0 0 0  PHQ-9 Score 7 0 0  Difficult doing work/chores Somewhat difficult      Scribe for Treatment Team: Claudio Culver, [place] 05/18/2024 1:47 PM

## 2024-05-18 NOTE — Group Note (Signed)
 Date:  05/18/2024 Time:  10:07 PM  Group Topic/Focus:  Wrap-Up Group:   The focus of this group is to help patients review their daily goal of treatment and discuss progress on daily workbooks.    Participation Level:  Active  Participation Quality:  Appropriate  Affect:  Appropriate  Cognitive:  Alert  Insight: Appropriate  Engagement in Group:  Engaged  Modes of Intervention:  Discussion  Additional Comments:    Rolland Cline 05/18/2024, 10:07 PM

## 2024-05-18 NOTE — Group Note (Signed)
 Date:  05/18/2024 Time:  11:06 AM  Group Topic/Focus:  Outside Rec/Music Therapy  The purpose of this group is to allow patients to go outside and get fresh air while playing outdoors activities. Also listening to soothing music.    Participation Level:  Active  Participation Quality:  Appropriate  Affect:  Appropriate  Cognitive:  Appropriate  Insight: Appropriate  Engagement in Group:  Engaged  Modes of Intervention:  Activity  Additional Comments:    Linnell Richardson 05/18/2024, 11:06 AM

## 2024-05-19 LAB — THYROID PANEL WITH TSH
Free Thyroxine Index: 1.4 (ref 1.2–4.9)
T3 Uptake Ratio: 27 % (ref 24–39)
T4, Total: 5.1 ug/dL (ref 4.5–12.0)
TSH: 49.1 u[IU]/mL — ABNORMAL HIGH (ref 0.450–4.500)

## 2024-05-19 NOTE — Group Note (Signed)
 Recreation Therapy Group Note   Group Topic:Leisure Education  Group Date: 05/19/2024 Start Time: 1500 End Time: 1510 Facilitators: Deatrice Factor, LRT, CTRS Location: Dayroom  Group Description: Leisure. Patients were given the option to choose from singing karaoke, coloring mandalas, using oil pastels, journaling, or playing with play-doh. LRT and pts discussed the meaning of leisure, the importance of participating in leisure during their free time/when they're outside of the hospital, as well as how our leisure interests can also serve as coping skills.   Goal Area(s) Addressed:  Patient will identify a current leisure interest.  Patient will learn the definition of "leisure". Patient will practice making a positive decision. Patient will have the opportunity to try a new leisure activity. Patient will communicate with peers and LRT.    Affect/Mood: Appropriate   Participation Level: Active and Engaged   Participation Quality: Independent   Behavior: Calm and Cooperative   Speech/Thought Process: Coherent   Insight: Good   Judgement: Good   Modes of Intervention: Music   Patient Response to Interventions:  Attentive, Engaged, Interested , and Receptive   Education Outcome:  Acknowledges education   Clinical Observations/Individualized Feedback: Alyssa [name] was active in their participation of session activities and group discussion. Pt chose to sing karaoke while in group. Pt interacted well with LRT and peers duration of session.    Plan: Continue to engage patient in RT group sessions 2-3x/week.   Deatrice Factor, LRT, CTRS 05/19/2024 5:04 PM

## 2024-05-19 NOTE — Group Note (Unsigned)
 LCSW Group Therapy Note   Group Date: 05/19/2024 Start Time: 1300 End Time: 1330   Type of Therapy and Topic:  Group Therapy:   Participation Level:  {BHH PARTICIPATION NWGNF:62130}  Description of Group:   Therapeutic Goals:  1.     Summary of Patient Progress:    ***  Therapeutic Modalities:   Sharl Davies 05/19/2024  1:31 PM

## 2024-05-19 NOTE — Plan of Care (Signed)
  Problem: Self-Concept: Goal: Ability to identify factors that promote anxiety will improve Outcome: Progressing Goal: Level of anxiety will decrease Outcome: Progressing Goal: Ability to modify response to factors that promote anxiety will improve Outcome: Progressing   

## 2024-05-19 NOTE — Group Note (Signed)
 Date:  05/19/2024 Time:  10:14 AM  Group Topic/Focus:  Rediscovering Joy:   The focus of this group is to explore various ways to relieve stress in a positive manner.    Participation Level:  Active  Participation Quality:  Appropriate  Affect:  Appropriate  Cognitive:  Appropriate  Insight: Appropriate  Engagement in Group:  Engaged  Modes of Intervention:  Activity  Additional Comments:    Marianna Shirk Cadel Stairs 05/19/2024, 10:14 AM

## 2024-05-19 NOTE — Progress Notes (Signed)
 [facility] MD Progress Note  05/19/2024 2:01 PM Alyssa [name]  MRN:  [id number]  Alyssa [name] is a 73 y.o. female admitted: Presented to the ED on 05/14/2024  1:26 PM for  having episodes non stop hours of pacing, fidgeting with hands, saying words that do not make sense, and paranoia. 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 She meets criteria for inpatient psychiatric admission.Patient is admitted to [facility] unit with Q15 min safety monitoring. Multidisciplinary team approach is offered. Medication management; group/milieu therapy is offered.   Subjective:  Chart reviewed, case discussed in multidisciplinary meeting, patient seen during rounds.  Patient is noted to be getting along well other peers, participating in groups, going outdoors for recreational group.  She reports that her anxiety is 4 out of 10, 10 being worst and depression is 4 out of 10.  She denies having any panic attacks.  She reports her anxiety and depression improved significantly.  Patient reports fair appetite and sleep and denies auditory/visual hallucinations.  She denies suicidal/homicidal ideations.  Per nursing staff patient is eating her meals 3 times per day.  She denies having any side effects to the medications.  Provider called patient's husband Mr. Wandrey who requested for aftercare psychiatric appointments and look into the possibility of referring her for an ACT team or at least IOP.  Communicated this message to the unit social worker team.   Sleep: Fair  Appetite:  Poor  Past Psychiatric History:  see h&P Family History:  Family History  Problem Relation Age of Onset   Hypertension Sister    Thyroid  cancer Brother    Skin cancer Brother    Diabetes Paternal Grandmother    Colon cancer Neg Hx    Breast cancer Neg Hx    Pancreatic cancer Neg Hx    Stomach cancer Neg Hx    Liver cancer Neg Hx    Esophageal cancer Neg Hx    Social History:  Social History   Substance and Sexual Activity  Alcohol Use Yes   Comment: Rare     Social History   Substance and Sexual Activity  Drug Use Never    Social History   Socioeconomic History   Marital status: Married    Spouse name: Not on file   Number of children: 0   Years of education: Not on file   Highest education level: Master's degree (e.g., MA, MS, MEng, MEd, MSW, MBA)  Occupational History   Occupation: retired, Chief Financial Officer  Tobacco Use   Smoking status: Never   Smokeless tobacco: Never  Vaping Use   Vaping status: Never Used  Substance and Sexual Activity   Alcohol use: Yes    Comment: Rare   Drug use: Never   Sexual activity: Not Currently  Other Topics Concern   Not on file  Social History Narrative   Moved from [place] June 2022   Lives w/ husband   Moved to stay close to her sister (Mrs Milon Aloe)   Social Drivers of Health   Financial Resource Strain: Low Risk  (01/27/2023)   Received from [facility], [facility]   Overall Financial Resource Strain (CARDIA)    Difficulty  of Paying Living Expenses: Not hard at all  Food Insecurity: No Food Insecurity (05/15/2024)   Hunger Vital Sign    Worried About Running Out of Food in the Last Year: Never true    Ran Out of Food in the Last Year: Never true  Transportation Needs: No Transportation Needs (05/15/2024)   PRAPARE - Administrator, Civil Service (Medical): No    Lack of Transportation (Non-Medical): No  Physical Activity: Sufficiently Active (01/27/2023)   Received from [facility], [facility]   Exercise Vital Sign    Days of  Exercise per Week: 5 days    Minutes of Exercise per Session: 30 min  Stress: No Stress Concern Present (01/27/2023)   Received from [facility], [facility] of Occupational Health - Occupational Stress Questionnaire    Feeling of Stress : Only a little  Social Connections: Socially Integrated (05/15/2024)   Social Connection and Isolation Panel [NHANES]    Frequency of Communication with Friends and Family: More than three times a week    Frequency of Social Gatherings with Friends and Family: Twice a week    Attends Religious Services: More than 4 times per year    Active Member of Clubs or Organizations: Yes    Attends Banker Meetings: 1 to 4 times per year    Marital Status: Married   Past Medical History:  Past Medical History:  Diagnosis Date   Anxiety and depression    Arthritis    Cervicogenic headache    Claustrophobia    Glaucoma    Hypertension    Lumbar radiculopathy    Postsurgical hypothyroidism    Spinal stenosis    Thyroid  cancer (HCC)    s/p thyroidectomy    Past Surgical History:  Procedure Laterality Date   FOOT SURGERY     LAMINOTOMY  04/03/2021   LEFT L3/4 LAMINOTOMY BILATERAL L3/4 MEDIAL FACETECTOMY   LUMBAR LAMINECTOMY  03/03/2018   THYROIDECTOMY  12/2004    Current Medications: Current Facility-Administered Medications  Medication Dose Route Frequency Provider Last Rate Last Admin   acetaminophen  (TYLENOL ) tablet 650 mg  650 mg Oral Q6H PRN Dorthea Gauze, NP       alum & mag hydroxide-simeth (MAALOX/MYLANTA) 200-200-20 MG/5ML suspension 30 mL  30 mL Oral Q4H PRN Dorthea Gauze, NP       clonazePAM (KLONOPIN) disintegrating tablet 0.5 mg  0.5 mg Oral BID Kieren Adkison, MD   0.5 mg at 05/19/24 0941   feeding supplement (ENSURE PLUS HIGH PROTEIN) liquid 237 mL  237 mL Oral BID BM Shallen Luedke, MD   237 mL at 05/19/24 0941   hydrOXYzine  (ATARAX ) tablet 50 mg  50 mg Oral Q6H PRN Aela Bohan, MD   50 mg at  05/17/24 0355   levothyroxine  (SYNTHROID ) tablet 137 mcg  137 mcg Oral Q0600 Ashir Kunz, MD   137 mcg at 05/19/24 5284   LORazepam  (ATIVAN ) tablet 1 mg  1 mg Oral Q6H PRN Aurelia Blotter, MD       magnesium  hydroxide (MILK OF MAGNESIA) suspension 30 mL  30 mL Oral Daily PRN Dorthea Gauze, NP       OLANZapine  (ZYPREXA ) injection 5 mg  5 mg Intramuscular TID PRN Dorthea Gauze, NP       OLANZapine  zydis (ZYPREXA ) disintegrating tablet 5 mg  5 mg Oral TID PRN Dorthea Gauze, NP       sertraline (ZOLOFT) tablet 25 mg  25 mg Oral Daily Shalan Neault,  Shaana Acocella, MD   25 mg at 05/19/24 0942   traZODone  (DESYREL ) tablet 50 mg  50 mg Oral QHS PRN Rayli Wiederhold, MD   50 mg at 05/17/24 2127    Lab Results:  Results for orders placed or performed during the hospital encounter of 05/15/24 (from the past 48 hours)  Thyroid  Panel With TSH     Status: Abnormal   Collection Time: 05/18/24  7:14 AM  Result Value Ref Range   TSH 49.100 (H) 0.450 - 4.500 uIU/mL   T4, Total 5.1 4.5 - 12.0 ug/dL   T3 Uptake Ratio 27 24 - 39 %   Free Thyroxine Index 1.4 1.2 - 4.9    Comment: (NOTE) Performed At: [facility] [address] [place], [place] [id number] Pearlean Botts MD WU:[id number]     Blood Alcohol level:  Lab Results  Component Value Date   [facility] <15 05/14/2024   ETH <10 02/09/2024    Metabolic Disorder Labs: Lab Results  Component Value Date   HGBA1C 5.1 06/07/2022   MPG 100 06/07/2022   No results found for: "PROLACTIN" Lab Results  Component Value Date   CHOL 309 (H) 06/07/2022   TRIG 62 06/07/2022   HDL 75 06/07/2022   CHOLHDL 4.1 06/07/2022   VLDL 12 06/07/2022   LDLCALC 222 (H) 06/07/2022   LDLCALC 125 (H) 11/27/2021    Physical Findings: AIMS:  , ,  ,  ,    CIWA:    COWS:      Psychiatric Specialty Exam:  Presentation  General Appearance:  Appropriate for Environment; Casual  Eye Contact: Fleeting  Speech: Slow  Speech Volume: Decreased    Mood and Affect   Mood: Anxious; Dysphoric  Affect: Depressed   Thought Process  Thought Processes: Irrevelant  Descriptions of Associations:Intact  Orientation:Partial  Thought Content:Illogical  Hallucinations:denies  Ideas of Reference:None  Suicidal Thoughts:denies  Homicidal Thoughts:denies   Sensorium  Memory: Immediate Fair; Recent Fair; Remote Poor  Judgment: Impaired  Insight: Shallow   Executive Functions  Concentration: Poor  Attention Span: Poor  Recall: Poor  Fund of Knowledge: Fair  Language: Fair   Psychomotor Activity  Psychomotor Activity: normal  Musculoskeletal: Strength & Muscle Tone: within normal limits Gait & Station: normal Assets  Assets: Manufacturing systems engineer; Desire for Improvement; Resilience; Social Support    Physical Exam: Physical Exam ROS Blood pressure 124/65, pulse 63, temperature 97.7 F (36.5 C), resp. rate 14, height 5\' 8"  (1.727 m), weight 67.8 kg, SpO2 99%. Body mass index is 22.73 kg/m.  Diagnosis: Principal Problem:   Hypothyroidism Active Problems:   MDD (major depressive disorder) GAD Clinical Decision Making: Patient is admitted for worsening anxiety, confusion in the context of abrupt stopping of her medications including benzos, Synthroid  with her TSH elevated to 52 probably contributing to her psychiatric manifestation.  Patient needs ongoing inpatient hospitalization for further stabilization   Treatment Plan Summary:   Safety and Monitoring:             -- Voluntary admission to inpatient psychiatric unit for safety, stabilization and treatment             -- Daily contact with patient to assess and evaluate symptoms and progress in treatment             -- Patient's case to be discussed in multi-disciplinary team meeting             -- Observation Level: q15 minute checks             --  Vital signs:  q12 hours             -- Precautions: suicide, elopement, and assault   2. Psychiatric  Diagnoses and Treatment:               Klonopin 0.5 mg twice daily prevent benzo withdrawal seizures/delirium  Zoloft 25 mg to help with depression/anxiety Restarted home levothyroxine  and follow hospitalist regarding elevated TSH of 52 Will hold off Seroquel  at this time to minimize multiple sedative medications -- The risks/benefits/side-effects/alternatives to this medication were discussed in detail with the patient and time was given for questions. The patient consents to medication trial.                -- Metabolic profile and EKG monitoring obtained while on an atypical antipsychotic (BMI: Lipid Panel: HbgA1c: QTc:)              -- Encouraged patient to participate in unit milieu and in scheduled group therapies                            3. Medical Issues Being Addressed:   Restarted home levothyroxine  and follow hospitalist regarding elevated TSH of 52  4. Discharge Planning:   -- Social work and case management to assist with discharge planning and identification of hospital follow-up needs prior to discharge  -- Estimated LOS: 3-4 days  Aurelia Blotter, MD 05/19/2024, 2:01 PM

## 2024-05-19 NOTE — Group Note (Signed)
 Recreation Therapy Group Note   Group Topic:General Recreation  Group Date: 05/19/2024 Start Time: 1100 End Time: 1140 Facilitators: Deatrice Factor, LRT, CTRS Location: Courtyard  Group Description: Outdoor Recreation. Patients had the option to play corn hole, ring toss, bowling or listening to music while outside in the courtyard getting fresh air and sunlight. Patients helped water  and prune the raised garden beds. LRT and patients discussed things that they enjoy doing in their free time outside of the hospital. LRT encouraged patients to drink water  after being active and getting their heart rate up.   Goal Area(s) Addressed: Patient will identify leisure interests.  Patient will practice healthy decision making. Patient will engage in recreation activity.   Affect/Mood: Appropriate and Flat   Participation Level: Moderate   Participation Quality: Independent   Behavior: Cooperative   Speech/Thought Process: Coherent   Insight: Fair   Judgement: Fair    Modes of Intervention: Activity   Patient Response to Interventions:  Receptive   Education Outcome:  In group clarification offered    Clinical Observations/Individualized Feedback: Alyssa [name] was active in their participation of session activities and group discussion. Pt was observed interacting with one peer duration of session.    Plan: Continue to engage patient in RT group sessions 2-3x/week.   Deatrice Factor, LRT, CTRS 05/19/2024 1:50 PM

## 2024-05-19 NOTE — Progress Notes (Signed)
   05/19/24 0945  Spiritual Encounters  Type of Visit Initial  Care provided to: Patient  Conversation partners present during encounter Other (comment) (Mental Health Tech)  Reason for visit Routine spiritual support  OnCall Visit No   Chaplain visited patient while rounding on the Unit.  Chaplain offered spiritual care but patient didn't indicate anything was needed.  Chaplain will follow-up as needed/requested by patient and/or staff.  Rev. Rana M. Nolon Baxter, M.Div. Chaplain Resident [facility]

## 2024-05-19 NOTE — Progress Notes (Signed)
   05/19/24 0000  Psych Admission Type (Psych Patients Only)  Admission Status Involuntary  Psychosocial Assessment  Patient Complaints Anxiety  Eye Contact Fair  Facial Expression Anxious  Affect Anxious  Speech Logical/coherent  Interaction Minimal  Motor Activity Slow  Appearance/Hygiene In scrubs  Behavior Characteristics Cooperative  Mood Pleasant  Thought Process  Coherency WDL  Content WDL  Delusions None reported or observed  Perception WDL  Hallucination None reported or observed  Judgment Impaired  Confusion Mild  Danger to Self  Current suicidal ideation? Denies  Danger to Others  Danger to Others None reported or observed

## 2024-05-19 NOTE — Group Note (Signed)
 [facility] LCSW Group Therapy Note    Group Date: 05/19/2024 Start Time: 1300 End Time: 1330  Type of Therapy and Topic:  Group Therapy:  Overcoming Obstacles  Participation Level:  [facility] PARTICIPATION LEVEL: Active  Mood:  Description of Group:   In this group patients will be encouraged to explore what they see as obstacles to their own wellness and recovery. They will be guided to discuss their thoughts, feelings, and behaviors related to these obstacles. The group will process together ways to cope with barriers, with attention given to specific choices patients can make. Each patient will be challenged to identify changes they are motivated to make in order to overcome their obstacles. This group will be process-oriented, with patients participating in exploration of their own experiences as well as giving and receiving support and challenge from other group members.  Therapeutic Goals: 1. Patient will identify personal and current obstacles as they relate to admission. 2. Patient will identify barriers that currently interfere with their wellness or overcoming obstacles.  3. Patient will identify feelings, thought process and behaviors related to these barriers. 4. Patient will identify two changes they are willing to make to overcome these obstacles:    Summary of Patient Progress   Pt reports she feels social media has increased loneliness in the youth. Pt reports she tries to limit her social media use to decrease her feelings of loneliness.    Therapeutic Modalities:   Cognitive Behavioral Therapy Solution Focused Therapy Motivational Interviewing Relapse Prevention Therapy   Claudio Culver, LCSWA

## 2024-05-19 NOTE — Progress Notes (Signed)
   05/19/24 1000  Psych Admission Type (Psych Patients Only)  Admission Status Involuntary  Psychosocial Assessment  Patient Complaints Anxiety  Eye Contact Fair  Facial Expression Anxious  Affect Anxious  Speech Slow  Interaction Minimal  Motor Activity Slow  Appearance/Hygiene In scrubs  Behavior Characteristics Cooperative  Mood Anxious  Thought Process  Coherency WDL  Content WDL  Delusions None reported or observed  Perception WDL  Hallucination None reported or observed  Judgment Impaired  Confusion Mild  Danger to Self  Current suicidal ideation? Denies  Danger to Others  Danger to Others None reported or observed

## 2024-05-19 NOTE — Group Note (Signed)
 Date:  05/19/2024 Time:  10:55 PM  Group Topic/Focus:  Wrap-Up Group:   The focus of this group is to help patients review their daily goal of treatment and discuss progress on daily workbooks.    Participation Level:  Active  Participation Quality:  Appropriate  Affect:  Appropriate  Cognitive:  Appropriate  Insight: Appropriate  Engagement in Group:  Engaged  Modes of Intervention:  Discussion  Additional Comments:    Andreas Kays 05/19/2024, 10:55 PM

## 2024-05-20 DIAGNOSIS — F332 Major depressive disorder, recurrent severe without psychotic features: Secondary | ICD-10-CM | POA: Diagnosis not present

## 2024-05-20 NOTE — Progress Notes (Signed)
 [facility] MD Progress Note  05/20/2024 9:56 PM Alyssa [name]  MRN:  [id number]  Alyssa [name] is a 73 y.o. female admitted: Presented to the ED on 05/14/2024  1:26 PM for  having episodes non stop hours of pacing, fidgeting with hands, saying words that do not make sense, and paranoia. 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 She meets criteria for inpatient psychiatric admission.Patient is admitted to [facility] unit with Q15 min safety monitoring. Multidisciplinary team approach is offered. Medication management; group/milieu therapy is offered.   Subjective:  Chart reviewed, case discussed in multidisciplinary meeting, patient seen during rounds.  Patient is noted to be getting along well with the peers and talking to them.  She wanted to know about the discharge planning.  Provider discussed in detail the need for safe discharge planning that includes getting her an ACT referral or IOP/PHP.  Provider informed the patient that the team and her family feel safe with the intensive outpatient services after discharge.  Patient agreed for the social worker to assist her in getting connected with the IOP or PHP.  She denies SI/HI/plan and denies auditory/visual hallucinations.  She reports her anxiety being under control.   Collateral : Husband : called 05/19/24 and updated treatment plan   Sleep: Fair  Appetite:  Poor  Past Psychiatric History: see h&P Family History:  Family History  Problem Relation Age of Onset   Hypertension Sister    Thyroid  cancer Brother    Skin cancer Brother     Diabetes Paternal Grandmother    Colon cancer Neg Hx    Breast cancer Neg Hx    Pancreatic cancer Neg Hx    Stomach cancer Neg Hx    Liver cancer Neg Hx    Esophageal cancer Neg Hx    Social History:  Social History   Substance and Sexual Activity  Alcohol Use Yes   Comment: Rare     Social History   Substance and Sexual Activity  Drug Use Never    Social History   Socioeconomic History   Marital status: Married    Spouse name: Not on file   Number of children: 0   Years of education: Not on file   Highest education level: Master's degree (e.g., MA, MS, MEng, MEd, MSW, MBA)  Occupational History   Occupation: retired, Chief Financial Officer  Tobacco Use   Smoking status: Never   Smokeless tobacco: Never  Vaping Use   Vaping status: Never Used  Substance and Sexual Activity   Alcohol use: Yes    Comment: Rare   Drug use: Never   Sexual activity: Not Currently  Other Topics Concern   Not on file  Social History Narrative   Moved from [place] June 2022   Lives w/ husband   Moved to stay close to her sister (Mrs Alyssa [name])   Social Drivers of Health   Financial Resource Strain: Low Risk  (01/27/2023)   Received from [employer], [facility]   Overall Financial Resource Strain (CARDIA)    Difficulty of Paying Living Expenses: Not hard at all  Food Insecurity: No Food Insecurity (05/15/2024)   Hunger Vital Sign  Worried About Programme researcher, broadcasting/film/video in the Last Year: Never true    Ran Out of Food in the Last Year: Never true  Transportation Needs: No Transportation Needs (05/15/2024)   PRAPARE - Administrator, Civil Service (Medical): No    Lack of Transportation (Non-Medical): No  Physical Activity: Sufficiently Active (01/27/2023)   Received from [facility], [facility]   Exercise Vital Sign    Days of Exercise per Week: 5 days    Minutes of Exercise per Session: 30 min  Stress: No Stress Concern Present (01/27/2023)   Received from [facility],  [facility] of Occupational Health - Occupational Stress Questionnaire    Feeling of Stress : Only a little  Social Connections: Socially Integrated (05/15/2024)   Social Connection and Isolation Panel [NHANES]    Frequency of Communication with Friends and Family: More than three times a week    Frequency of Social Gatherings with Friends and Family: Twice a week    Attends Religious Services: More than 4 times per year    Active Member of Clubs or Organizations: Yes    Attends Banker Meetings: 1 to 4 times per year    Marital Status: Married   Past Medical History:  Past Medical History:  Diagnosis Date   Anxiety and depression    Arthritis    Cervicogenic headache    Claustrophobia    Glaucoma    Hypertension    Lumbar radiculopathy    Postsurgical hypothyroidism    Spinal stenosis    Thyroid  cancer (HCC)    s/p thyroidectomy    Past Surgical History:  Procedure Laterality Date   FOOT SURGERY     LAMINOTOMY  04/03/2021   LEFT L3/4 LAMINOTOMY BILATERAL L3/4 MEDIAL FACETECTOMY   LUMBAR LAMINECTOMY  03/03/2018   THYROIDECTOMY  12/2004    Current Medications: Current Facility-Administered Medications  Medication Dose Route Frequency Provider Last Rate Last Admin   acetaminophen  (TYLENOL ) tablet 650 mg  650 mg Oral Q6H PRN Dorthea Gauze, NP       alum & mag hydroxide-simeth (MAALOX/MYLANTA) 200-200-20 MG/5ML suspension 30 mL  30 mL Oral Q4H PRN Dorthea Gauze, NP       clonazePAM (KLONOPIN) disintegrating tablet 0.5 mg  0.5 mg Oral BID Maudry Zeidan, MD   0.5 mg at 05/20/24 1000   feeding supplement (ENSURE PLUS HIGH PROTEIN) liquid 237 mL  237 mL Oral BID BM Davionte Lusby, MD   237 mL at 05/20/24 1557   hydrOXYzine  (ATARAX ) tablet 50 mg  50 mg Oral Q6H PRN Exa Bomba, MD   50 mg at 05/17/24 0355   levothyroxine  (SYNTHROID ) tablet 137 mcg  137 mcg Oral Q0600 Lakeyta Vandenheuvel, MD   137 mcg at 05/20/24 0700   LORazepam  (ATIVAN ) tablet  1 mg  1 mg Oral Q6H PRN Aurelia Blotter, MD       magnesium  hydroxide (MILK OF MAGNESIA) suspension 30 mL  30 mL Oral Daily PRN Dorthea Gauze, NP       OLANZapine  (ZYPREXA ) injection 5 mg  5 mg Intramuscular TID PRN Dorthea Gauze, NP       OLANZapine  zydis (ZYPREXA ) disintegrating tablet 5 mg  5 mg Oral TID PRN Dorthea Gauze, NP       sertraline (ZOLOFT) tablet 25 mg  25 mg Oral Daily Laylanie Kruczek, MD   25 mg at 05/20/24 1000   traZODone  (DESYREL ) tablet 50 mg  50 mg Oral QHS PRN Tyishia Aune,  Mahogany Torrance, MD   50 mg at 05/20/24 2144    Lab Results:  No results found for this or any previous visit (from the past 48 hours).   Blood Alcohol level:  Lab Results  Component Value Date   [facility] <15 05/14/2024   ETH <10 02/09/2024    Metabolic Disorder Labs: Lab Results  Component Value Date   HGBA1C 5.1 06/07/2022   MPG 100 06/07/2022   No results found for: PROLACTIN Lab Results  Component Value Date   CHOL 309 (H) 06/07/2022   TRIG 62 06/07/2022   HDL 75 06/07/2022   CHOLHDL 4.1 06/07/2022   VLDL 12 06/07/2022   LDLCALC 222 (H) 06/07/2022   LDLCALC 125 (H) 11/27/2021    Physical Findings: AIMS:  , ,  ,  ,    CIWA:    COWS:      Psychiatric Specialty Exam:  Presentation  General Appearance:  Appropriate for Environment; Casual  Eye Contact: Fleeting  Speech: Slow  Speech Volume: Decreased    Mood and Affect  Mood: Anxious; Dysphoric  Affect: Depressed   Thought Process  Thought Processes: Irrevelant  Descriptions of Associations:Intact  Orientation:Partial  Thought Content:Illogical  Hallucinations:denies  Ideas of Reference:None  Suicidal Thoughts:denies  Homicidal Thoughts:denies   Sensorium  Memory: Immediate Fair; Recent Fair; Remote Poor  Judgment: Impaired  Insight: Shallow   Executive Functions  Concentration: Poor  Attention Span: Poor  Recall: Poor  Fund of Knowledge: Fair  Language: Fair   Psychomotor  Activity  Psychomotor Activity: normal  Musculoskeletal: Strength & Muscle Tone: within normal limits Gait & Station: normal Assets  Assets: Manufacturing systems engineer; Desire for Improvement; Resilience; Social Support    Physical Exam: Physical Exam ROS Blood pressure (!) 143/74, pulse 68, temperature 97.7 F (36.5 C), temperature source Oral, resp. rate 16, height 5' 8 (1.727 m), weight 67.8 kg, SpO2 97%. Body mass index is 22.73 kg/m.  Diagnosis: Principal Problem:   Hypothyroidism Active Problems:   MDD (major depressive disorder) GAD Clinical Decision Making: Patient is admitted for worsening anxiety, confusion in the context of abrupt stopping of her medications including benzos, Synthroid  with her TSH elevated to 52 probably contributing to her psychiatric manifestation.  Patient needs ongoing inpatient hospitalization for further stabilization   Treatment Plan Summary:   Safety and Monitoring:             -- Voluntary admission to inpatient psychiatric unit for safety, stabilization and treatment             -- Daily contact with patient to assess and evaluate symptoms and progress in treatment             -- Patient's case to be discussed in multi-disciplinary team meeting             -- Observation Level: q15 minute checks             -- Vital signs:  q12 hours             -- Precautions: suicide, elopement, and assault   2. Psychiatric Diagnoses and Treatment:               Klonopin 0.5 mg twice daily prevent benzo withdrawal seizures/delirium  Zoloft 25 mg to help with depression/anxiety Restarted home levothyroxine  and follow hospitalist regarding elevated TSH of 52 Will hold off Seroquel  at this time to minimize multiple sedative medications -- The risks/benefits/side-effects/alternatives to this medication were discussed in detail with the patient and time  was given for questions. The patient consents to medication trial.                -- Metabolic profile and  EKG monitoring obtained while on an atypical antipsychotic (BMI: Lipid Panel: HbgA1c: QTc:)              -- Encouraged patient to participate in unit milieu and in scheduled group therapies                            3. Medical Issues Being Addressed:   Restarted home levothyroxine  and follow hospitalist regarding elevated TSH of 52  4. Discharge Planning:   -- Social work and case management to assist with discharge planning and identification of hospital follow-up needs prior to discharge  -- Estimated LOS: 1-2days  Aurelia Blotter, MD 05/20/2024, 9:56 PM

## 2024-05-20 NOTE — Progress Notes (Signed)
   05/20/24 1600  Psych Admission Type (Psych Patients Only)  Admission Status Involuntary  Psychosocial Assessment  Eye Contact Fair  Facial Expression Anxious  Affect Anxious  Speech Slow  Interaction Minimal  Motor Activity Slow  Appearance/Hygiene In scrubs  Thought Process  Coherency WDL  Content WDL  Delusions None reported or observed  Perception WDL  Hallucination None reported or observed  Judgment Impaired  Confusion Mild  Danger to Self  Current suicidal ideation? Denies  Danger to Others  Danger to Others None reported or observed

## 2024-05-20 NOTE — Group Note (Signed)
 Date:  05/20/2024 Time:  11:19 AM  Group Topic/Focus:  Outside Rec The Purpose of this group is to allow patients to go outside and get fresh air while doing outdoor activities and socializing with others     Participation Level:  Did Not Attend  Linnell Richardson 05/20/2024, 11:19 AM

## 2024-05-20 NOTE — Progress Notes (Signed)
   05/19/24 2133  Psych Admission Type (Psych Patients Only)  Admission Status Involuntary  Psychosocial Assessment  Patient Complaints Anxiety  Eye Contact Fair  Facial Expression Animated  Affect Preoccupied (Concerned w/DC wanted to leave after spouse visit, redirectable)  Speech Slow  Interaction Minimal  Motor Activity Slow  Appearance/Hygiene Unremarkable  Behavior Characteristics Cooperative  Mood Depressed  Aggressive Behavior  Effect No apparent injury  Thought Process  Coherency WDL  Content WDL  Delusions None reported or observed  Perception WDL  Hallucination None reported or observed  Judgment Impaired  Confusion Mild  Danger to Self  Current suicidal ideation? Denies  Danger to Others  Danger to Others None reported or observed

## 2024-05-20 NOTE — Group Note (Signed)
 Date:  05/20/2024 Time:  9:48 PM  Group Topic/Focus:  Wrap-Up Group:   The focus of this group is to help patients review their daily goal of treatment and discuss progress on daily workbooks.    Participation Level:  Did Not Attend   Alyssa [name] 05/20/2024, 9:48 PM

## 2024-05-20 NOTE — Progress Notes (Signed)
   05/20/24 0600  15 Minute Checks  Location Bedroom  Visual Appearance Calm  Behavior Sleeping  Sleep (Behavioral Health Patients Only)  Calculate sleep? (Click Yes once per 24 hr at 0600 safety check) Yes  Documented sleep last 24 hours 9.25

## 2024-05-20 NOTE — Group Note (Signed)
 Physical/Occupational Therapy Group Note  Group Topic: Functional, Dynamic Balance   Group Date: 05/20/2024 Start Time: 1300 End Time: 1335 Facilitators: Maecie Sevcik, Otelia Blew, PT   Group Description: Group discussed impact of balance on safety and independence with functional tasks.  Identified and discussed any self-perceived balance deficits to personalize information.  Discussed and reviewed strategies to address/improve balance deficits: use of assist devices, activity pacing/energy conservation, environment/home safety modifications, focusing attention/minimizing distraction.  Reviewed and participated with standing LE therex designed to target dynamic balance reactions and LE strength/stability; provided handouts with HEP to be utilized outside of group time as appropriate.  Allowed time for questions and further discussion on any balance or mobility concerns/needs.  Therapeutic Goal(s):  Identify and discuss any individual balance deficits and functional implications. Identify and discuss any environmental/home safety modifications that can optimize balance and safety for mobility within the home. Demonstrate understanding and performance of standing therex designed to target dynamic balance deficits.  Individual Participation: Pt actively participated with the discussion and physical activity components of the session.  Pt was generally steady with standing therex/balance activities requiring only SBA for general safety and min cuing for proper sequencing.  Pt volunteered to participate in a higher level balance task in front of the group with close SBA for safety.   Participation Level: Active and Engaged   Participation Quality: Minimal Cues   Behavior: Appropriate, Attentive , and Cooperative   Speech/Thought Process: Coherent, Focused, and Relevant   Affect/Mood: Appropriate   Insight: Good   Judgement: Good   Modes of Intervention: Activity, Discussion, and Education   Patient Response to Interventions:  Attentive, Engaged, Interested , and Receptive   Plan: Continue to engage patient in PT/OT groups 1 - 2x/week.  Lavenia Post PT, DPT 05/20/24, 2:06 PM

## 2024-05-20 NOTE — Plan of Care (Signed)
  Problem: Self-Concept: Goal: Ability to identify factors that promote anxiety will improve Outcome: Progressing Goal: Level of anxiety will decrease Outcome: Progressing Goal: Ability to modify response to factors that promote anxiety will improve Outcome: Progressing   

## 2024-05-20 NOTE — Plan of Care (Signed)
  Problem: Education: Goal: Ability to state activities that reduce stress will improve Outcome: Progressing   Problem: Coping: Goal: Ability to identify and develop effective coping behavior will improve Outcome: Progressing   Problem: Health Behavior/Discharge Planning: Goal: Ability to make decisions will improve Outcome: Progressing Goal: Compliance with therapeutic regimen will improve Outcome: Progressing   Problem: Safety: Goal: Ability to disclose and discuss suicidal ideas will improve Outcome: Progressing Goal: Ability to identify and utilize support systems that promote safety will improve Outcome: Progressing

## 2024-05-21 DIAGNOSIS — F332 Major depressive disorder, recurrent severe without psychotic features: Secondary | ICD-10-CM | POA: Diagnosis not present

## 2024-05-21 MED ORDER — SERTRALINE HCL 25 MG PO TABS
25.0000 mg | ORAL_TABLET | Freq: Every day | ORAL | 0 refills | Status: DC
Start: 2024-05-22 — End: 2024-07-27

## 2024-05-21 MED ORDER — CLONAZEPAM 0.5 MG PO TBDP: 0.5000 mg | ORAL_TABLET | Freq: Two times a day (BID) | ORAL | 0 refills | Status: DC

## 2024-05-21 MED ORDER — LEVOTHYROXINE SODIUM 137 MCG PO TABS: 137.0000 ug | ORAL_TABLET | Freq: Every day | ORAL | 0 refills | Status: DC

## 2024-05-21 NOTE — Progress Notes (Signed)
 Pt was visible in the dayroom and observed interacting with selective peers.  When offered HS medication, Pt initially refused adding "I have already taking my medication.  I am being given too much medication."  Although Pt appeared preoccupied and paranoia, she denied.  She denied HI/SI, AVH, depression and anxiety.      05/20/24 2300  Psych Admission Type (Psych Patients Only)  Admission Status Involuntary  Psychosocial Assessment  Patient Complaints Anxiety  Eye Contact Fair  Facial Expression Anxious  Affect Anxious  Speech Slow  Interaction Minimal  Motor Activity Slow  Appearance/Hygiene In scrubs  Behavior Characteristics Cooperative  Aggressive Behavior  Effect No apparent injury  Thought Process  Coherency WDL  Content WDL  Delusions None reported or observed  Perception WDL  Hallucination None reported or observed  Judgment Impaired  Confusion Mild  Danger to Self  Current suicidal ideation? Denies  Danger to Others  Danger to Others None reported or observed    Problem: Education: Goal: Ability to state activities that reduce stress will improve Outcome: Progressing   Problem: Coping: Goal: Ability to identify and develop effective coping behavior will improve Outcome: Progressing   Problem: Self-Concept: Goal: Ability to identify factors that promote anxiety will improve Outcome: Progressing Goal: Level of anxiety will decrease Outcome: Progressing Goal: Ability to modify response to factors that promote anxiety will improve Outcome: Progressing   Problem: Coping: Goal: Coping ability will improve Outcome: Progressing Goal: Will verbalize feelings Outcome: Progressing

## 2024-05-21 NOTE — Group Note (Signed)
 Date:  05/21/2024 Time:  10:23 AM  Group Topic/Focus:  Movent Therapy    Participation Level:  Active  Participation Quality:  Appropriate  Affect:  Appropriate  Cognitive:  Appropriate  Insight: Appropriate  Engagement in Group:  Engaged  Modes of Intervention:  Activity  Additional Comments:  none   Merton Abts 05/21/2024, 10:23 AM

## 2024-05-21 NOTE — Plan of Care (Signed)
  Problem: Education: Goal: Knowledge of the prescribed therapeutic regimen will improve Outcome: Progressing   Problem: Health Behavior/Discharge Planning: Goal: Compliance with therapeutic regimen will improve Outcome: Progressing   Problem: Self-Concept: Goal: Level of anxiety will decrease Outcome: Not Progressing

## 2024-05-21 NOTE — Group Note (Signed)
 LCSW Group Therapy Note  Group Date: 05/21/2024 Start Time: 1315 End Time: 1400   Type of Therapy and Topic:  Group Therapy - Healthy vs Unhealthy Coping Skills  Participation Level:  Active   Description of Group The focus of this group was to determine what unhealthy coping techniques typically are used by group members and what healthy coping techniques would be helpful in coping with various problems. Patients were guided in becoming aware of the differences between healthy and unhealthy coping techniques. Patients were asked to identify 2-3 healthy coping skills they would like to learn to use more effectively.  Therapeutic Goals Patients learned that coping is what human beings do all day long to deal with various situations in their lives Patients defined and discussed healthy vs unhealthy coping techniques Patients identified their preferred coping techniques and identified whether these were healthy or unhealthy Patients determined 2-3 healthy coping skills they would like to become more familiar with and use more often. Patients provided support and ideas to each other   Summary of Patient Progress:  During group, Lateesha expressed that she has felt some of the symptoms talked about during group. Pt reports she relies on her religion to help her cop. Patient proved open to input from peers and feedback from CSW. Patient demonstrated fair insight into the subject matter, was respectful of peers, and participated throughout the entire session.   Therapeutic Modalities Cognitive Behavioral Therapy Motivational Interviewing  Claudio Culver, [place] 05/21/2024  2:07 PM

## 2024-05-21 NOTE — Group Note (Signed)
 Recreation Therapy Group Note   Group Topic:Health and Wellness  Group Date: 05/21/2024 Start Time: 1100 End Time: 1135 Facilitators: Deatrice Factor, LRT, CTRS Location: Courtyard  Group Description: Outdoor Recreation. Patients had the option to play corn hole, ring toss, bowling or listening to music while outside in the courtyard getting fresh air and sunlight. Patients helped water  and prune the raised garden beds. LRT and patients discussed things that they enjoy doing in their free time outside of the hospital. LRT encouraged patients to drink water  after being active and getting their heart rate up.   Goal Area(s) Addressed: Patient will identify leisure interests.  Patient will practice healthy decision making. Patient will engage in recreation activity.   Affect/Mood: Appropriate and Flat   Participation Level: Minimal    Clinical Observations/Individualized Feedback: Alyssa [name] was present in group for 10 minutes.   Plan: Continue to engage patient in RT group sessions 2-3x/week.   Deatrice Factor, LRT, CTRS 05/21/2024 2:31 PM

## 2024-05-21 NOTE — BHH Suicide Risk Assessment (Signed)
 [facility] Discharge Suicide Risk Assessment   Principal Problem: Hypothyroidism Discharge Diagnoses: Principal Problem:   Hypothyroidism Active Problems:   MDD (major depressive disorder)   Total Time spent with patient: 30 minutes  Musculoskeletal: Strength & Muscle Tone: within normal limits Gait & Station: normal Patient leans: N/A  Psychiatric Specialty Exam  Presentation  General Appearance:  Appropriate for Environment; Casual  Eye Contact: Fair  Speech: Clear and Coherent  Speech Volume: Normal  Handedness: Right   Mood and Affect  Mood: Euthymic  Duration of Depression Symptoms: No data recorded Affect: Appropriate   Thought Process  Thought Processes: Coherent  Descriptions of Associations:Intact  Orientation:Full (Time, Place and Person)  Thought Content:Logical  History of Schizophrenia/Schizoaffective disorder:No data recorded Duration of Psychotic Symptoms:No data recorded Hallucinations:Hallucinations: None  Ideas of Reference:None  Suicidal Thoughts:Suicidal Thoughts: No  Homicidal Thoughts:Homicidal Thoughts: No   Sensorium  Memory: Immediate Fair; Recent Fair; Remote Fair  Judgment: Fair  Insight: Fair   Art therapist  Concentration: Fair  Attention Span: Fair  Recall: [employer] of Knowledge: Fair  Language: Fair   Psychomotor Activity  Psychomotor Activity: Psychomotor Activity: Normal   Assets  Assets: Communication Skills; Desire for Improvement; Social Support   Sleep  Sleep: Sleep: Fair  Estimated Sleeping Duration (Last 24 Hours): 8.25-8.75 hours  Physical Exam: Physical Exam ROS Blood pressure 122/65, pulse 68, temperature (!) 97.3 F (36.3 C), resp. rate 18, height 5' 8 (1.727 m), weight 67.8 kg, SpO2 100%. Body mass index is 22.73 kg/m.  Mental Status Per Nursing Assessment::   On Admission:  NA  Demographic Factors:  Caucasian  Loss Factors: Decrease in vocational  status  Historical Factors: Impulsivity  Risk Reduction Factors:   Living with another person, especially a relative, Positive social support, Positive therapeutic relationship, and Positive coping skills or problem solving skills  Continued Clinical Symptoms:  Severe Anxiety and/or Agitation  Cognitive Features That Contribute To Risk:  None    Suicide Risk:  Minimal: No identifiable suicidal ideation.  Patients presenting with no risk factors but with morbid ruminations; may be classified as minimal risk based on the severity of the depressive symptoms   Follow-up Information     [facility],  Outpatient Behavioral. Go on 05/29/2024.   Why: You are scheduled for an assessment for the Partial Hospitalization Program on Friday, 6/20 at 10:00 am. PHP is virtual group therapy that meets Mon-Fri from 9am-1pm for approximately 2-3 weeks. This assessment appointment will last approximately 1-1.5 hours and will be virtual via [employer] You will receive an email with the link for your appointment the day of your appointment. Please download the [employer] Teams app prior to the appointment if you will use a tablet or phone for the appointment. If you need to cancel or reschedule, please call [phone]. Contact information: [address] [place] [address] [phone]                 Plan Of Care/Follow-up recommendations:  Activity:  As tolerated  Aurelia Blotter, MD 05/21/2024, 10:35 PM

## 2024-05-21 NOTE — Progress Notes (Signed)
 [facility] MD Progress Note  05/21/2024 12:19 PM Alyssa [name]  MRN:  [id number]  Alyssa [name] is a 73 y.o. female admitted: Presented to the ED on 05/14/2024  1:26 PM for  having episodes non stop hours of pacing, fidgeting with hands, saying words that do not make sense, and paranoia. 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 She meets criteria for inpatient psychiatric admission.Patient is admitted to [facility] unit with Q15 min safety monitoring. Multidisciplinary team approach is offered. Medication management; group/milieu therapy is offered.   Subjective:  Chart reviewed, case discussed in multidisciplinary meeting, patient seen during rounds.  Patient reports that she was given too many medications yesterday at very close intervals and was very anxious about getting her medications today morning.  Provider and the nurse showed her the computer screen and Pyxis information where she received 2 tablets of 0.25 mg Klonopin with a total dose of 0.5 mg.  Patient reports feeling better.  Per staff patient continues to do very well with peers participating in groups noted to be in good mood smiling and conversing with the peers throughout the day.  Patient denies having any panic attacks, denies SI/HI/intent/plan.  Patient denies auditory/visual hallucinations.   Collateral : Husband : called 05/19/24 and updated treatment plan   Sleep: Fair  Appetite:  Poor  Past Psychiatric History: see h&P Family History:  Family History  Problem Relation Age of Onset   Hypertension Sister     Thyroid  cancer Brother    Skin cancer Brother    Diabetes Paternal Grandmother    Colon cancer Neg Hx    Breast cancer Neg Hx    Pancreatic cancer Neg Hx    Stomach cancer Neg Hx    Liver cancer Neg Hx    Esophageal cancer Neg Hx    Social History:  Social History   Substance and Sexual Activity  Alcohol Use Yes   Comment: Rare     Social History   Substance and Sexual Activity  Drug Use Never    Social History   Socioeconomic History   Marital status: Married    Spouse name: Not on file   Number of children: 0   Years of education: Not on file   Highest education level: Master's degree (e.g., MA, MS, MEng, MEd, MSW, MBA)  Occupational History   Occupation: retired, Chief Financial Officer  Tobacco Use   Smoking status: Never   Smokeless tobacco: Never  Vaping Use   Vaping status: Never Used  Substance and Sexual Activity   Alcohol use: Yes    Comment: Rare   Drug use: Never   Sexual activity: Not Currently  Other Topics Concern   Not on file  Social History Narrative   Moved from [place] June 2022   Lives w/ husband   Moved to stay close to her sister (Mrs Milon Aloe)   Social Drivers of Health   Financial Resource Strain: Low Risk  (01/27/2023)   Received from [facility]   Overall Financial Resource Strain (CARDIA)    Difficulty of Paying Living Expenses: Not hard at all  Food Insecurity: No Food Insecurity (05/15/2024)   Hunger Vital Sign  Worried About Programme researcher, broadcasting/film/video in the Last Year: Never true    Ran Out of Food in the Last Year: Never true  Transportation Needs: No Transportation Needs (05/15/2024)   PRAPARE - Administrator, Civil Service (Medical): No    Lack of Transportation (Non-Medical): No  Physical Activity: Sufficiently Active (01/27/2023)   Received from [facility]   Exercise Vital Sign    Days of Exercise per Week: 5 days    Minutes of Exercise per Session: 30 min  Stress: No Stress Concern Present (01/27/2023)   Received from  [facility] - Occupational Stress Questionnaire    Feeling of Stress : Only a little  Social Connections: Socially Integrated (05/15/2024)   Social Connection and Isolation Panel    Frequency of Communication with Friends and Family: More than three times a week    Frequency of Social Gatherings with Friends and Family: Twice a week    Attends Religious Services: More than 4 times per year    Active Member of Clubs or Organizations: Yes    Attends Banker Meetings: 1 to 4 times per year    Marital Status: Married   Past Medical History:  Past Medical History:  Diagnosis Date   Anxiety and depression    Arthritis    Cervicogenic headache    Claustrophobia    Glaucoma    Hypertension    Lumbar radiculopathy    Postsurgical hypothyroidism    Spinal stenosis    Thyroid  cancer (HCC)    s/p thyroidectomy    Past Surgical History:  Procedure Laterality Date   FOOT SURGERY     LAMINOTOMY  04/03/2021   LEFT L3/4 LAMINOTOMY BILATERAL L3/4 MEDIAL FACETECTOMY   LUMBAR LAMINECTOMY  03/03/2018   THYROIDECTOMY  12/2004    Current Medications: Current Facility-Administered Medications  Medication Dose Route Frequency Provider Last Rate Last Admin   acetaminophen  (TYLENOL ) tablet 650 mg  650 mg Oral Q6H PRN Dorthea Gauze, NP       alum & mag hydroxide-simeth (MAALOX/MYLANTA) 200-200-20 MG/5ML suspension 30 mL  30 mL Oral Q4H PRN Dorthea Gauze, NP       clonazePAM (KLONOPIN) disintegrating tablet 0.5 mg  0.5 mg Oral BID Devine Dant, MD   0.5 mg at 05/21/24 0936   feeding supplement (ENSURE PLUS HIGH PROTEIN) liquid 237 mL  237 mL Oral BID BM Daniella Dewberry, MD   237 mL at 05/21/24 0936   hydrOXYzine  (ATARAX ) tablet 50 mg  50 mg Oral Q6H PRN Jeanae Whitmill, MD   50 mg at 05/17/24 0355   levothyroxine  (SYNTHROID ) tablet 137 mcg  137 mcg Oral Q0600 Sybella Harnish, MD   137 mcg at 05/21/24 0548   LORazepam  (ATIVAN ) tablet 1 mg  1  mg Oral Q6H PRN Aurelia Blotter, MD       magnesium  hydroxide (MILK OF MAGNESIA) suspension 30 mL  30 mL Oral Daily PRN Dorthea Gauze, NP       OLANZapine  (ZYPREXA ) injection 5 mg  5 mg Intramuscular TID PRN Dorthea Gauze, NP       OLANZapine  zydis (ZYPREXA ) disintegrating tablet 5 mg  5 mg Oral TID PRN Dorthea Gauze, NP       sertraline (ZOLOFT) tablet 25 mg  25 mg Oral Daily Ismael Karge, MD   25 mg at 05/21/24 0936   traZODone  (DESYREL ) tablet 50 mg  50 mg Oral QHS PRN Schuyler Olden, MD   50  mg at 05/20/24 2144    Lab Results:  No results found for this or any previous visit (from the past 48 hours).   Blood Alcohol level:  Lab Results  Component Value Date   [facility] <15 05/14/2024   ETH <10 02/09/2024    Metabolic Disorder Labs: Lab Results  Component Value Date   HGBA1C 5.1 06/07/2022   MPG 100 06/07/2022   No results found for: PROLACTIN Lab Results  Component Value Date   CHOL 309 (H) 06/07/2022   TRIG 62 06/07/2022   HDL 75 06/07/2022   CHOLHDL 4.1 06/07/2022   VLDL 12 06/07/2022   LDLCALC 222 (H) 06/07/2022   LDLCALC 125 (H) 11/27/2021    Physical Findings: AIMS:  , ,  ,  ,    CIWA:    COWS:      Psychiatric Specialty Exam:  Presentation  General Appearance:  Appropriate for Environment; Casual  Eye Contact: normal Speech: Slow  Speech Volume: normal   Mood and Affect  Mood: fine  Affect: euthymic   Thought Process  Thought Processes: coherent Descriptions of Associations:Intact  Orientation:x3 Thought Content:linear improved Hallucinations:denies  Ideas of Reference:None  Suicidal Thoughts:denies  Homicidal Thoughts:denies   Sensorium  Memory: Immediate Fair; Recent Fair; Remote Poor  Judgment: improved Insight: improved  Executive Functions  Concentration: fair Attention Span: fair Recall: fair Fund of Knowledge: Fair  Language: Fair   Psychomotor Activity  Psychomotor  Activity: normal  Musculoskeletal: Strength & Muscle Tone: within normal limits Gait & Station: normal Assets  Assets: Manufacturing systems engineer; Desire for Improvement; Resilience; Social Support    Physical Exam: Physical Exam ROS Blood pressure 104/71, pulse (!) 59, temperature 98.4 F (36.9 C), resp. rate 18, height 5' 8 (1.727 m), weight 67.8 kg, SpO2 99%. Body mass index is 22.73 kg/m.  Diagnosis: Principal Problem:   Hypothyroidism Active Problems:   MDD (major depressive disorder) GAD Clinical Decision Making: Patient is admitted for worsening anxiety, confusion in the context of abrupt stopping of her medications including benzos, Synthroid  with her TSH elevated to 52 probably contributing to her psychiatric manifestation.  Patient needs ongoing inpatient hospitalization for further stabilization   Treatment Plan Summary:   Safety and Monitoring:             -- Voluntary admission to inpatient psychiatric unit for safety, stabilization and treatment             -- Daily contact with patient to assess and evaluate symptoms and progress in treatment             -- Patient's case to be discussed in multi-disciplinary team meeting             -- Observation Level: q15 minute checks             -- Vital signs:  q12 hours             -- Precautions: suicide, elopement, and assault   2. Psychiatric Diagnoses and Treatment:               Klonopin 0.5 mg twice daily prevent benzo withdrawal seizures/delirium  Zoloft 25 mg to help with depression/anxiety  levothyroxine  and follow hospitalist regarding elevated TSH of 52 Will hold off Seroquel  at this time to minimize multiple sedative medications -- The risks/benefits/side-effects/alternatives to this medication were discussed in detail with the patient and time was given for questions. The patient consents to medication trial.                --  Metabolic profile and EKG monitoring obtained while on an atypical antipsychotic  (BMI: Lipid Panel: HbgA1c: QTc:)              -- Encouraged patient to participate in unit milieu and in scheduled group therapies                            3. Medical Issues Being Addressed:   Restarted home levothyroxine  and follow hospitalist regarding elevated TSH of 52  4. Discharge Planning:   -- Social work and case management to assist with discharge planning and identification of hospital follow-up needs prior to discharge  -- Estimated LOS: 1-2days  Aurelia Blotter, MD 05/21/2024, 12:19 PM

## 2024-05-21 NOTE — Group Note (Signed)
 Recreation Therapy Group Note   Group Topic:Emotion Expression  Group Date: 05/21/2024 Start Time: 1500 End Time: 1605 Facilitators: Yvonna Herder, CTRS Location: Craft Room  Group Description: Painting a Diplomatic Services operational officer. Patients and LRT discuss what it means to be "at peace", what it feels like physically and mentally. Pts are given a canvas and watercolor paint to use and encouraged to draw their idea of a peaceful place. Pts and LRT discuss how they use this in their daily life post discharge. Pts are encouraged to take their canvas home with them as a reminder to find their peaceful place whenever they are feeling depressed, anxious, etc.    Goal Area(s) Addressed:  Patient will identify what it means to experience a "peaceful" emotion. Patient will identify a new coping skill.  Patient will express their emotions through art. Patients will increase communication by talking with LRT and peers while in group.   Affect/Mood: Appropriate   Participation Level: Active and Engaged   Participation Quality: Independent   Behavior: Appropriate, Calm, and Cooperative   Speech/Thought Process: Coherent   Insight: Good   Judgement: Good   Modes of Intervention: Art   Patient Response to Interventions:  Attentive, Engaged, Interested , and Receptive   Education Outcome:  Acknowledges education   Clinical Observations/Individualized Feedback: Deshea was active in their participation of session activities and group discussion. Pt identified being in the woods as her peaceful place. 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 Pt interacted well with LRT and peers duration of session.    Plan: Continue to engage patient in RT group sessions 2-3x/week.   Deatrice Factor, LRT, CTRS 05/21/2024 4:35 PM

## 2024-05-21 NOTE — Progress Notes (Signed)
 Patient is pleasant and cooperative.   Anxious affect.  Suspicious and paranoid.  Endorses anxiety.  Denies SI/HI and AVH.  Denies pain.  Compliant with medications after speaking with Dr. [name] Peper and viewing the [facility] to verify her dosages.  15 min checks in place for safety.  Patient is present in the milieu.  Appropriate interaction with peers and staff.    Plan is for patient to discharge tomorrow.

## 2024-05-21 NOTE — Group Note (Signed)
 Date:  05/21/2024 Time:  8:40 PM  Group Topic/Focus:  Goals Group:   The focus of this group is to help patients establish daily goals to achieve during treatment and discuss how the patient can incorporate goal setting into their daily lives to aide in recovery.    Participation Level:  Active  Participation Quality:  Appropriate  Affect:  Appropriate  Cognitive:  Appropriate  Insight: Good  Engagement in Group:  Engaged  Modes of Intervention:  Discussion  Additional Comments:    Lynette Saras 05/21/2024, 8:40 PM

## 2024-05-22 DIAGNOSIS — F332 Major depressive disorder, recurrent severe without psychotic features: Secondary | ICD-10-CM | POA: Diagnosis not present

## 2024-05-22 NOTE — Plan of Care (Signed)
  Problem: Education: Goal: Ability to state activities that reduce stress will improve Outcome: Progressing   Problem: Coping: Goal: Ability to identify and develop effective coping behavior will improve Outcome: Progressing   Problem: Self-Concept: Goal: Ability to identify factors that promote anxiety will improve Outcome: Progressing Goal: Level of anxiety will decrease Outcome: Progressing Goal: Ability to modify response to factors that promote anxiety will improve Outcome: Progressing   Problem: Education: Goal: Utilization of techniques to improve thought processes will improve Outcome: Progressing Goal: Knowledge of the prescribed therapeutic regimen will improve Outcome: Progressing   Problem: Activity: Goal: Interest or engagement in leisure activities will improve Outcome: Progressing Goal: Imbalance in normal sleep/wake cycle will improve Outcome: Progressing   Problem: Coping: Goal: Coping ability will improve Outcome: Progressing Goal: Will verbalize feelings Outcome: Progressing   Problem: Health Behavior/Discharge Planning: Goal: Ability to make decisions will improve Outcome: Progressing Goal: Compliance with therapeutic regimen will improve Outcome: Progressing   Problem: Role Relationship: Goal: Will demonstrate positive changes in social behaviors and relationships Outcome: Progressing   Problem: Safety: Goal: Ability to disclose and discuss suicidal ideas will improve Outcome: Progressing Goal: Ability to identify and utilize support systems that promote safety will improve Outcome: Progressing   Problem: Self-Concept: Goal: Will verbalize positive feelings about self Outcome: Progressing Goal: Level of anxiety will decrease Outcome: Progressing   

## 2024-05-22 NOTE — Progress Notes (Signed)
 Discharge Note:  Patient denies SI/HI/AVH at this time.  Discharge instructions, AVS, prescriptions, and transition record reviewed with patient.  Patient agrees to comply with medication management and follow-up visit.   Patient belongings returned to patient. Questions and concerns addressed and answered.  Patient ambulatory off unit. Patient discharged to home with husband.  Patient is confused and unable to complete Suicide Safety Plan.

## 2024-05-22 NOTE — Progress Notes (Signed)
 Patient is pleasant and cooperative.  Preoccupied, anxious affect.  Restless.  Denies SI/HI and AVH.  Denies pain.  Reports she slept well.  Compliant with scheduled medications.  15 min checks in place for safety.  Patient is present in the milieu.  Appropriate interaction with peers.  Patient is discharging home today with her husband.

## 2024-05-22 NOTE — Care Management Important Message (Signed)
 Important Message  Patient Details  Name: Alyssa [name] MRN: [id number] Date of Birth: Feb 21, 1951   Important Message Given:  Yes - Medicare IM     Claudio Culver, LCSWA 05/22/2024, 9:29 AM

## 2024-05-22 NOTE — Group Note (Signed)
 Date:  05/22/2024 Time:  11:41 AM  Group Topic/Focus:  Building Self Esteem:   The Focus of this group is helping patients become aware of the effects of self-esteem on their lives, the things they and others do that enhance or undermine their self-esteem, seeing the relationship between their level of self-esteem and the choices they make and learning ways to enhance self-esteem.    Participation Level:  Did Not Attend   Lyndol Santee 05/22/2024, 11:41 AM

## 2024-05-22 NOTE — Group Note (Deleted)
 Physical/Occupational Therapy Group Note  Group Topic: Estate manager/land agent, Adaptive Equipment for ADLs  Group Date: 05/22/2024 Start Time: 1500 End Time: 1537 Facilitators: Orlie Bjornstad, OT   Group Description: Group educated on safety considerations/modifications with bathing, dressing and ADL routines to maximize independence and minimize fall risk with tasks.  Reviewed and demonstrated use of shower chair and tub transfer bench as appropriate.  Reviewed and demonstrated use of adaptive equipment (sock aide, reacher, long-handled sponge) available for ADL tasks.  Reviewed and demonstrated role of activity pacing and energy conservation with functional activities.  Provided handout with visual reference of available equipment.  Therapeutic Goal(s):  Verbalize and demonstrate safe technique for tub/shower transfers with DME/adaptive equipment as needed. Verbalize and demonstrate appropriate use of adaptive equipment with bathing, dressing and ADL routine. Verbalize and demonstrate appropriate use of activity pacing and energy conservation with bathing, dressing and ADL routine.  Individual Participation:  Participation Level: {OT BHH Participation WUJWJ:19147}   Participation Quality: {OT BHH Participation Quality:26268}   Behavior: {BHH OT Group Behavior:26269}   Speech/Thought Process: {BHH OT Speech/Thought Process:26270}   Affect/Mood: {OT BHH Affect/Mood:26271}   Insight: {OT BHH Insight:26272}   Judgement: {OT BHH Judgement:26272}   Modes of Intervention: {BHH MODES OF INTERVENTION:26273}  Patient Response to Interventions:  {BHH OT Patient Response to Interventions:26274}   Plan: Continue to engage patient in PT/OT groups 1 - 2x/week.  05/22/2024  Orlie Bjornstad, OT

## 2024-05-22 NOTE — Progress Notes (Signed)
 Patient was up and visible on the unit since the onset of the shift. Appropriately interactive with both staff and peers. Patient c/o no having a BM in two days. Patient accepted PRN M.O.M. 30mg . Noted to be effective, reported BM x 1. Compliant with scheduled medications with some encouragement. Denied all psych s/s. Patient is looking forward to scheduled discharge this morning. Patient is currently lying in bed resting, rise/ fall of chest noted. All orders maintained as written.

## 2024-05-22 NOTE — Progress Notes (Signed)
  [facility] Adult Case Management Discharge Plan :  Will you be returning to the same living situation after discharge:  Yes,  pt will return home  At discharge, do you have transportation home?: Yes,  pt's husband will pick her up  Do you have the ability to pay for your medications: Yes,  MEDICARE / MEDICARE PART A AND B  Release of information consent forms completed and in the chart;  Patient's signature needed at discharge.  Patient to Follow up at:  Follow-up Information     [facility]. Go on 05/29/2024.   Why: You are scheduled for an assessment for the Partial Hospitalization Program on Friday, 6/20 at 10:00 am. PHP is virtual group therapy that meets Mon-Fri from 9am-1pm for approximately 2-3 weeks. This assessment appointment will last approximately 1-1.5 hours and will be virtual via [employer] You will receive an email with the link for your appointment the day of your appointment. Please download the [employer] Teams app prior to the appointment if you will use a tablet or phone for the appointment. If you need to cancel or reschedule, please call [phone]. Contact information: [address] [place] [address] [phone]                 Next level of care provider has access to [facility] Link:no  Safety Planning and Suicide Prevention discussed: Yes,  Brier Firebaugh, husband, [phone]     Has patient been referred to the Quitline?: Patient does not use tobacco/nicotine products  Patient has been referred for addiction treatment: No known substance use disorder.  [address], LCSWA 05/22/2024, 9:19 AM

## 2024-05-22 NOTE — Group Note (Signed)
 Physical/Occupational Therapy Group Note  Group Topic: Estate manager/land agent, Adaptive Equipment for ADLs  Group Date: 05/22/2024 Start Time: 1300 End Time: 1337 Facilitators: Orlie Bjornstad, OT   Group Description: Group educated on safety considerations/modifications with bathing, dressing and ADL routines to maximize independence and minimize fall risk with tasks.  Reviewed and demonstrated use of shower chair and tub transfer bench as appropriate.  Reviewed and demonstrated use of adaptive equipment (sock aide, reacher, long-handled sponge) available for ADL tasks.  Reviewed and demonstrated role of activity pacing and energy conservation with functional activities.  Provided handout with visual reference of available equipment.  Therapeutic Goal(s):  Verbalize and demonstrate safe technique for tub/shower transfers with DME/adaptive equipment as needed. Verbalize and demonstrate appropriate use of adaptive equipment with bathing, dressing and ADL routine. Verbalize and demonstrate appropriate use of activity pacing and energy conservation with bathing, dressing and ADL routine.  Individual Participation: did not attend   Participation Level:    Participation Quality:    Behavior:    Speech/Thought Process:    Affect/Mood:    Insight:    Judgement:    Modes of Intervention:   Patient Response to Interventions:     Plan: Continue to engage patient in PT/OT groups 1 - 2x/week.  05/22/2024  Orlie Bjornstad, OT Gerre Kraft, OTD OTR/L  05/22/24, 3:09 PM

## 2024-05-22 NOTE — Plan of Care (Signed)
  Problem: Education: Goal: Utilization of techniques to improve thought processes will improve Outcome: Progressing   Problem: Health Behavior/Discharge Planning: Goal: Compliance with therapeutic regimen will improve Outcome: Progressing

## 2024-05-28 NOTE — Discharge Summary (Signed)
 Physician Discharge Summary Note  Patient:  Alyssa [name] is an 73 y.o., female MRN:  [id number] DOB:  08/04/1951 Patient phone:  [phone] (home)  Patient address:   [address] [place] [address],    Date of Admission:  05/15/2024 Date of Discharge: 05/22/24  Reason for Admission:  [name] is a 73 y.o. female admitted: Presented to the ED on 05/14/2024  1:26 PM for  having episodes non stop hours of pacing, fidgeting with hands, saying words that do not make sense, and paranoia. 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 She meets criteria for inpatient psychiatric admission.Patient is admitted to [facility] unit with Q15 min safety monitoring. Multidisciplinary team approach is offered   Principal Problem: Hypothyroidism Discharge Diagnoses: Principal Problem:   Hypothyroidism Active Problems:   MDD (major depressive disorder)   Past Psychiatric History: see h&p  Family Psychiatric  History: see h&p Social History:  Social History   Substance and Sexual Activity  Alcohol Use Yes   Comment: Rare     Social History   Substance and Sexual Activity  Drug Use Never    Social History   Socioeconomic History   Marital status: Married    Spouse name: Not on file   Number of children: 0   Years of education: Not on file   Highest education level: Master's degree (e.g., MA, MS, MEng, MEd, MSW, MBA)  Occupational History   Occupation: retired, Chief Financial Officer  Tobacco Use   Smoking status: Never   Smokeless tobacco: Never  Vaping Use   Vaping  status: Never Used  Substance and Sexual Activity   Alcohol use: Yes    Comment: Rare   Drug use: Never   Sexual activity: Not Currently  Other Topics Concern   Not on file  Social History Narrative   Moved from [place] June 2022   Lives w/ husband   Moved to stay close to her sister (Mrs Andra)   Social Drivers of Health   Financial Resource Strain: Low Risk  (01/27/2023)   Received from [facility]   Overall Financial Resource Strain (CARDIA)    Difficulty of Paying Living Expenses: Not hard at all  Food Insecurity: No Food Insecurity (05/15/2024)   Hunger Vital Sign    Worried About Running Out of Food in the Last Year: Never true    Ran Out of Food in the Last Year: Never true  Transportation Needs: No Transportation Needs (05/15/2024)   PRAPARE - Administrator, Civil Service (Medical): No    Lack of Transportation (Non-Medical): No  Physical Activity: Sufficiently Active (01/27/2023)   Received from [facility]   Exercise Vital Sign    On average, how many days per week do you engage in moderate to strenuous exercise (like a brisk walk)?: 5 days    On average, how many minutes do you engage in exercise at this level?: 30 min  Stress: No Stress Concern Present (01/27/2023)   Received from [facility] of Occupational Health - Occupational Stress Questionnaire    Feeling of Stress : Only a little  Social Connections: Socially Integrated (05/15/2024)  Social Advertising account executive    Frequency of Communication with Friends and Family: More than three times a week    Frequency of Social Gatherings with Friends and Family: Twice a week    Attends Religious Services: More than 4 times per year    Active Member of [employer] or Organizations: Yes    Attends Banker Meetings: 1 to 4 times per year    Marital Status: Married   Past Medical History:  Past Medical History:  Diagnosis Date   Anxiety and depression    Arthritis     Cervicogenic headache    Claustrophobia    Glaucoma    Hypertension    Lumbar radiculopathy    Postsurgical hypothyroidism    Spinal stenosis    Thyroid  cancer (HCC)    s/p thyroidectomy    Past Surgical History:  Procedure Laterality Date   FOOT SURGERY     LAMINOTOMY  04/03/2021   LEFT L3/4 LAMINOTOMY BILATERAL L3/4 MEDIAL FACETECTOMY   LUMBAR LAMINECTOMY  03/03/2018   THYROIDECTOMY  12/2004   Family History:  Family History  Problem Relation Age of Onset   Hypertension Sister    Thyroid  cancer Brother    Skin cancer Brother    Diabetes Paternal Grandmother    Colon cancer Neg Hx    Breast cancer Neg Hx    Pancreatic cancer Neg Hx    Stomach cancer Neg Hx    Liver cancer Neg Hx    Esophageal cancer Neg Hx     Hospital Course:  Alyssa [name] is a 73 y.o. female admitted: Presented to the ED on 05/14/2024  1:26 PM for  having episodes non stop hours of pacing, fidgeting with hands, saying words that do not make sense, and paranoia. 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 She meets criteria for inpatient psychiatric admission.Patient is admitted to [facility] unit with Q15 min safety monitoring. Multidisciplinary team approach is offered   On admission patient is noted to be very confused, distracted, disorganized thought process and unable to engage in meaningful interview with extreme anxiety.  Has been informed the team that patient has stopped all her medications including Klonopin , thyroid  medicine for the last 2 weeks.  Her home medications were started back  including Klonopin  and Zoloft  25 mg was added to help with the anxiety.  Seroquel  was discontinued to minimize the sedation side effects.  Patient tolerated the medication very well with no reported side effects.  Patient participated in the treatment team and is noted to be very social and mingling with the peers, participating in groups.  She maintained safe behaviors on the unit consistently for many days.  On the day of discharge she consistently denied SI/HI/plan.  She denies auditory/visual hallucinations.  She remains future oriented and is willing to participate in intensive outpatient services after discharge.  Detailed risk assessment is complete based on clinical exam and individual risk factors and acute suicide risk is low and acute violence risk is low.     Currently, all modifiable risk of harm to self/harm to others have been addressed and patient is no longer appropriate for the acute inpatient setting  and is able to continue treatment for mental health needs in the community with the supports as indicated below.  Patient is educated and verbalized understanding of discharge plan of care including medications, follow-up appointments, mental health resources and further crisis services in the community.  patient is instructed to call 911 or present to the nearest emergency room should patient experience any decompensation in mood, disturbance of bowel or return of suicidal/homicidal ideations.  Patient verbalizes understanding of this education and agrees to this plan of care  Physical Findings: AIMS:  , ,  ,  ,    CIWA:    COWS:        Psychiatric Specialty Exam:  Presentation  General Appearance:  Appropriate for Environment; Casual  Eye Contact: Fair  Speech: Clear and Coherent  Speech Volume: Normal    Mood and Affect  Mood: Euthymic  Affect: Appropriate   Thought Process  Thought Processes: Coherent  Descriptions of  Associations:Intact  Orientation:Full (Time, Place and Person)  Thought Content:Logical  Hallucinations:denies Ideas of Reference:None  Suicidal Thoughts:denies Homicidal Thoughts:denies  Sensorium  Memory: Immediate Fair; Recent Fair; Remote Fair  Judgment: Fair  Insight: Fair   Art therapist  Concentration: Fair  Attention Span: Fair  Recall: Fair  Fund of Knowledge: Fair  Language: Fair   Psychomotor Activity  Psychomotor Activity:WNL Musculoskeletal: Strength & Muscle Tone: within normal limits Gait & Station: normal Assets  Assets: Manufacturing systems engineer; Desire for Improvement; Social Support   Sleep  Sleep:No data recorded   Physical Exam: Physical Exam Vitals and nursing note reviewed.    ROS Blood pressure 131/67, pulse (!) 57, temperature 98.6 F (37 C), resp. rate 18, height 5' 8 (1.727 m), weight 67.8 kg, SpO2 99%. Body mass index is 22.73 kg/m.   Social History   Tobacco Use  Smoking Status Never  Smokeless Tobacco Never   Tobacco Cessation:  N/A, patient does not currently use tobacco products   Blood Alcohol level:  Lab Results  Component Value Date   [facility] <15 05/14/2024   ETH <10 02/09/2024    Metabolic Disorder Labs:  Lab Results  Component Value Date   HGBA1C 5.1 06/07/2022   MPG 100 06/07/2022   No results found for: PROLACTIN Lab Results  Component Value Date   CHOL 309 (H) 06/07/2022   TRIG 62 06/07/2022   HDL 75 06/07/2022   CHOLHDL 4.1 06/07/2022   VLDL 12 06/07/2022   LDLCALC 222 (H) 06/07/2022   LDLCALC 125 (H) 11/27/2021    See Psychiatric Specialty Exam and Suicide Risk Assessment completed by Attending Physician prior to discharge.  Discharge destination:  Home  Is patient on multiple antipsychotic therapies at discharge:  No   Has Patient had three or more failed trials of antipsychotic monotherapy by history:  No  Recommended Plan for Multiple Antipsychotic  Therapies: NA   Allergies as of 05/22/2024       Reactions   Amoxicillin Hives   Clavulanic Acid Other (See Comments)   Family is unaware of this         Medication List     STOP taking these medications    clonazePAM  0.5 MG tablet Commonly known as: KLONOPIN  Replaced by: clonazePAM  0.5 MG disintegrating tablet   escitalopram  10 MG tablet Commonly known as: LEXAPRO    LORazepam  1 MG tablet Commonly known as: ATIVAN    QUEtiapine  100 MG tablet Commonly known as: SEROQUEL    traZODone  150 MG tablet Commonly known as: DESYREL    traZODone  50 MG  tablet Commonly known as: DESYREL        TAKE these medications      Indication  clonazePAM  0.5 MG disintegrating tablet Commonly known as: KLONOPIN  Take 1 tablet (0.5 mg total) by mouth 2 (two) times daily. Replaces: clonazePAM  0.5 MG tablet    Estradiol  10 MCG Tabs vaginal tablet Place 1 tablet (10 mcg total) vaginally 2 (two) times a week.    levothyroxine  137 MCG tablet Commonly known as: Synthroid  Take 1 tablet (137 mcg total) by mouth daily at 6 (six) AM. What changed: when to take this    sertraline  25 MG tablet Commonly known as: ZOLOFT  Take 1 tablet (25 mg total) by mouth daily.    Travoprost (BAK Free) 0.004 % Soln ophthalmic solution Commonly known as: TRAVATAN Place 1 drop into both eyes at bedtime.         Follow-up Information     [facility]. Go on 05/29/2024.   Why: You are scheduled for an assessment for the Partial Hospitalization Program on Friday, 6/20 at 10:00 am. PHP is virtual group therapy that meets Mon-Fri from 9am-1pm for approximately 2-3 weeks. This assessment appointment will last approximately 1-1.5 hours and will be virtual via [employer] You will receive an email with the link for your appointment the day of your appointment. Please download the [employer] Teams app prior to the appointment if you will use a tablet or phone for the  appointment. If you need to cancel or reschedule, please call [phone]. Contact information: [address] [place] [address] [phone]                 Follow-up recommendations:  Activity:  As tolerated    Signed: Aldrin Engelhard, MD 05/28/2024, 11:37 PM

## 2024-05-29 ENCOUNTER — Other Ambulatory Visit (HOSPITAL_COMMUNITY): Attending: Psychiatry | Admitting: Professional

## 2024-05-29 DIAGNOSIS — F333 Major depressive disorder, recurrent, severe with psychotic symptoms: Secondary | ICD-10-CM | POA: Insufficient documentation
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
## 2024-07-10 ENCOUNTER — Emergency Department (HOSPITAL_COMMUNITY)
Admission: EM | Admit: 2024-07-10 | Discharge: 2024-07-10 | Disposition: A | Discharge status: Home / Self Care | Attending: Emergency Medicine | Admitting: Emergency Medicine

## 2024-07-10 ENCOUNTER — Encounter (HOSPITAL_COMMUNITY): Payer: Self-pay

## 2024-07-10 ENCOUNTER — Other Ambulatory Visit: Payer: Self-pay

## 2024-07-10 DIAGNOSIS — G4709 Other insomnia: Secondary | ICD-10-CM | POA: Diagnosis not present

## 2024-07-10 DIAGNOSIS — Z85850 Personal history of malignant neoplasm of thyroid: Secondary | ICD-10-CM | POA: Insufficient documentation

## 2024-07-10 DIAGNOSIS — I10 Essential (primary) hypertension: Secondary | ICD-10-CM | POA: Insufficient documentation

## 2024-07-10 DIAGNOSIS — F331 Major depressive disorder, recurrent, moderate: Secondary | ICD-10-CM | POA: Diagnosis not present

## 2024-07-10 DIAGNOSIS — F419 Anxiety disorder, unspecified: Secondary | ICD-10-CM | POA: Diagnosis present

## 2024-07-10 MED ORDER — CLONAZEPAM 0.125 MG PO TBDP
0.2500 mg | ORAL_TABLET | Freq: Two times a day (BID) | ORAL | Status: DC
  Administered 2024-07-10: 0.25 mg via ORAL
  Filled 2024-07-10: qty 2

## 2024-07-10 NOTE — ED Provider Notes (Signed)
 [place] EMERGENCY DEPARTMENT AT [facility] Provider Note  CSN: [id number] Arrival date & time: 07/10/24 1047  Chief Complaint(s) Anxiety and Insomnia (/)  HPI Alyssa [name] is a 73 y.o. female with a past medical history listed below who presents to the emergency department for anxiety, depression and insomnia.  Patient was recently admitted to [facility] and discharged on Klonopin .  Patient reports that she does not like taking the medicine because she feels like she might make bad decisions if she takes them.  Husband reports that the patient's brother used to be on Klonopin  and told her that he made bad decisions while taking it as well.  Husband reports that when she does take the medicine it does calm her down.  She admits to the same.  They report that patient was previously being followed by several therapists but she decided not to continue seeing them.  She was seen by a dual board-certified internal medicine and psychiatry Dr. But she chose not to continue seeing that person either.  The history is provided by the patient and the spouse.    Past Medical History Past Medical History:  Diagnosis Date  . Anxiety and depression   . Arthritis   . Cervicogenic headache   . Claustrophobia   . Glaucoma   . Hypertension   . Lumbar radiculopathy   . Postsurgical hypothyroidism   . Spinal stenosis   . Thyroid  cancer [facility])    s/p thyroidectomy   Patient Active Problem List   Diagnosis Date Noted  . Hypothyroidism 05/17/2024  . MDD (major depressive disorder) 05/15/2024  . Mood disorder (HCC) 02/09/2024  . Generalized anxiety disorder 02/09/2024  . Decreased libido 12/12/2022  . MDD (major depressive disorder), recurrent, severe, with psychosis (HCC) 08/21/2022  . Major depressive disorder with psychotic features (HCC) 08/19/2022  . Paranoid delusion (HCC) 06/07/2022  . Elevated troponin 11/26/2021  . Chest pain 11/26/2021  . Vaginal dryness 08/02/2021  . Gastritis  06/17/2021  . Constipation 06/17/2021  . Nausea and vomiting 06/17/2021  . Hyponatremia 06/16/2021  . PCP NOTES >>>>>>>>>>> 06/06/2021  . 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  . 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 Status post left foot surgery 05/15/2021  . Anxiety and depression 04/26/2021  . Cervicogenic headache 04/26/2021  . Claustrophobia 04/26/2021  . History of lumbar laminectomy 10/31/2020  . Other specified glaucoma 10/31/2020  . Left lumbar radiculopathy 06/21/2020  . Malignant neoplasm of thyroid  gland (HCC) 11/03/2019  . Postoperative hypothyroidism 11/03/2019  . Essential hypertension 12/11/2017   Home Medication(s) Prior to Admission medications   Medication Sig Start Date End Date Taking? Authorizing Provider  clonazePAM  (KLONOPIN ) 0.5 MG disintegrating tablet Take 1 tablet (0.5 mg total) by mouth 2 (two) times daily. 05/22/24   Donnelly Mellow, MD  Estradiol  10 MCG TABS vaginal tablet Place 1 tablet (10 mcg total) vaginally 2 (two) times a week. 12/13/22   Fredirick Glenys RAMAN, MD  levothyroxine  (SYNTHROID ) 137 MCG tablet Take 1 tablet (137 mcg total) by mouth daily at 6 (six) AM. 05/22/24   Jadapalle, Sree, MD  sertraline  (ZOLOFT ) 25 MG tablet Take 1 tablet (25 mg total) by mouth daily. 05/22/24   Jadapalle, Sree, MD  Travoprost, BAK Free, (TRAVATAN) 0.004 % SOLN ophthalmic solution Place 1 drop into both eyes at bedtime.    [provider]  buPROPion  (WELLBUTRIN  XL) 150 MG 24 hr tablet Take 150 mg by mouth every morning. 07/13/22 09/07/22  [provider]  OLANZapine  zydis (ZYPREXA ) 5 MG disintegrating tablet Take 0.5 tablets (2.5 mg total) by mouth 2 (two) times daily. Patient not taking: Reported on 08/18/2022 06/07/22 09/07/22  Nguyen, Julie, DO                                                                                                                                    Allergies Amoxicillin and Clavulanic  acid  Review of Systems Review of Systems As noted in HPI  Physical Exam Vital Signs  I have reviewed the triage vital signs BP 90/71 (BP Location: Right Arm)   Pulse 79   Temp 98.2 F (36.8 C) (Oral)   Resp 16   SpO2 98%   Physical Exam Vitals reviewed.  Constitutional:      General: She is not in acute distress.    Appearance: She is well-developed. She is not diaphoretic.  HENT:     Head: Normocephalic and atraumatic.     Right Ear: External ear normal.     Left Ear: External ear normal.     Nose: Nose normal.  Eyes:     General: No scleral icterus.    Conjunctiva/sclera: Conjunctivae normal.  Neck:     Trachea: Phonation normal.  Cardiovascular:     Rate and Rhythm: Normal rate and regular rhythm.  Pulmonary:     Effort: Pulmonary effort is normal. No respiratory distress.     Breath sounds: No stridor.  Abdominal:     General: There is no distension.  Musculoskeletal:        General: Normal range of motion.     Cervical back: Normal range of motion.  Neurological:     Mental Status: She is alert and oriented to person, place, and time.  Psychiatric:        Mood and Affect: Mood is anxious.        Speech: Speech is delayed.        Behavior: Behavior is withdrawn.     ED Results and Treatments Labs (all labs ordered are listed, but only abnormal results are displayed) Labs Reviewed - No data to display                                                                                                                       EKG  EKG Interpretation Date/Time:    Ventricular Rate:    PR Interval:    QRS Duration:  QT Interval:    QTC Calculation:   R Axis:      Text Interpretation:         Radiology No results found.  Medications Ordered in ED Medications  clonazepam  (KLONOPIN ) disintegrating tablet 0.25 mg (0.25 mg Oral Given 07/10/24 1215)   Procedures Procedures  (including critical care time) Medical Decision Making / ED Course    Medical Decision Making Risk Prescription drug management.    Patient presents for anxiety, depression and insomnia noncompliant with her medications prescribed to her during her recent psychiatric admission at [facility].  No acute issues.  After prolonged conversation discussing options for inpatient admission versus management at home, patient opted to take the medicine and be discharged home. She has good support at home and does not appear to be an immanent threat to herself or others.  They will follow-up with primary care provider in look for therapist/psychiatrist. Clinical Course as of 07/10/24 1353  Fri Jul 10, 2024  1352 Patient feels better after dosing  [PC]    Clinical Course User Index [PC] Baker Kogler, Raynell Moder, MD    Final Clinical Impression(s) / ED Diagnoses Final diagnoses:  Anxiety  Moderate episode of recurrent major depressive disorder (HCC)  Other insomnia   The patient appears reasonably screened and/or stabilized for discharge and I doubt any other medical condition or other [facility] requiring further screening, evaluation, or treatment in the ED at this time. I have discussed the findings, Dx and Tx plan with the patient/family who expressed understanding and agree(s) with the plan. Discharge instructions discussed at length. The patient/family was given strict return precautions who verbalized understanding of the instructions. No further questions at time of discharge.  Disposition: Discharge  Condition: Good  ED Discharge Orders     None        Follow Up: Primary care provider  Call  to schedule an appointment for close follow up  [facility] Address: [address], [place], [place] [address] Hours: Open 24 hours Monday through Sunday Phone: [phone] Go to  as needed    This chart was dictated using voice recognition software.  Despite best efforts to proofread,  errors can occur which can change the documentation  meaning.    Trine Raynell Moder, MD 07/10/24 1252

## 2024-07-10 NOTE — ED Triage Notes (Addendum)
 Husband reports that pt has been aggressive at home with him. Pt has been having behaviors at home that he is having problems managing. Pt is not taking her medications.

## 2024-07-13 ENCOUNTER — Emergency Department (HOSPITAL_COMMUNITY)
Admission: EM | Admit: 2024-07-13 | Discharge: 2024-07-14 | Disposition: A | Discharge status: Other Acute Inpatient Hospital | Attending: Emergency Medicine | Admitting: Emergency Medicine

## 2024-07-13 ENCOUNTER — Other Ambulatory Visit: Payer: Self-pay

## 2024-07-13 ENCOUNTER — Encounter (HOSPITAL_COMMUNITY): Payer: Self-pay

## 2024-07-13 ENCOUNTER — Emergency Department (HOSPITAL_COMMUNITY)

## 2024-07-13 DIAGNOSIS — F323 Major depressive disorder, single episode, severe with psychotic features: Secondary | ICD-10-CM | POA: Diagnosis not present

## 2024-07-13 DIAGNOSIS — I10 Essential (primary) hypertension: Secondary | ICD-10-CM | POA: Insufficient documentation

## 2024-07-13 DIAGNOSIS — F333 Major depressive disorder, recurrent, severe with psychotic symptoms: Secondary | ICD-10-CM | POA: Diagnosis not present

## 2024-07-13 DIAGNOSIS — F4310 Post-traumatic stress disorder, unspecified: Secondary | ICD-10-CM | POA: Insufficient documentation

## 2024-07-13 DIAGNOSIS — R4182 Altered mental status, unspecified: Secondary | ICD-10-CM | POA: Diagnosis not present

## 2024-07-13 DIAGNOSIS — E039 Hypothyroidism, unspecified: Secondary | ICD-10-CM | POA: Insufficient documentation

## 2024-07-13 DIAGNOSIS — F419 Anxiety disorder, unspecified: Secondary | ICD-10-CM | POA: Diagnosis present

## 2024-07-13 DIAGNOSIS — Z85850 Personal history of malignant neoplasm of thyroid: Secondary | ICD-10-CM | POA: Diagnosis not present

## 2024-07-13 DIAGNOSIS — F411 Generalized anxiety disorder: Secondary | ICD-10-CM | POA: Diagnosis not present

## 2024-07-13 DIAGNOSIS — T424X6A Underdosing of benzodiazepines, initial encounter: Secondary | ICD-10-CM | POA: Diagnosis not present

## 2024-07-13 DIAGNOSIS — R55 Syncope and collapse: Secondary | ICD-10-CM | POA: Diagnosis present

## 2024-07-13 DIAGNOSIS — Z91130 Patient's unintentional underdosing of medication regimen due to age-related debility: Secondary | ICD-10-CM | POA: Insufficient documentation

## 2024-07-13 DIAGNOSIS — T381X6A Underdosing of thyroid hormones and substitutes, initial encounter: Secondary | ICD-10-CM | POA: Diagnosis not present

## 2024-07-13 DIAGNOSIS — Z79899 Other long term (current) drug therapy: Secondary | ICD-10-CM | POA: Diagnosis not present

## 2024-07-13 DIAGNOSIS — Z9181 History of falling: Secondary | ICD-10-CM | POA: Diagnosis present

## 2024-07-13 DIAGNOSIS — E890 Postprocedural hypothyroidism: Secondary | ICD-10-CM | POA: Diagnosis not present

## 2024-07-13 DIAGNOSIS — S0990XA Unspecified injury of head, initial encounter: Secondary | ICD-10-CM | POA: Diagnosis present

## 2024-07-13 DIAGNOSIS — F32A Depression, unspecified: Secondary | ICD-10-CM

## 2024-07-13 LAB — URINALYSIS, ROUTINE W REFLEX MICROSCOPIC
Bilirubin Urine: NEGATIVE
Glucose, UA: NEGATIVE mg/dL
Hgb urine dipstick: NEGATIVE
Ketones, ur: 20 mg/dL — AB
Nitrite: NEGATIVE
Protein, ur: 100 mg/dL — AB
Specific Gravity, Urine: 1.029 (ref 1.005–1.030)
pH: 5 (ref 5.0–8.0)

## 2024-07-13 LAB — COMPREHENSIVE METABOLIC PANEL WITH GFR
ALT: 16 U/L (ref 0–44)
AST: 29 U/L (ref 15–41)
Albumin: 4.3 g/dL (ref 3.5–5.0)
Alkaline Phosphatase: 67 U/L (ref 38–126)
Anion gap: 13 (ref 5–15)
BUN: 36 mg/dL — ABNORMAL HIGH (ref 8–23)
CO2: 23 mmol/L (ref 22–32)
Calcium: 9.5 mg/dL (ref 8.9–10.3)
Chloride: 104 mmol/L (ref 98–111)
Creatinine, Ser: 0.88 mg/dL (ref 0.44–1.00)
GFR, Estimated: >60 mL/min (ref >60)
Glucose, Bld: 107 mg/dL — ABNORMAL HIGH (ref 70–99)
Potassium: 3.5 mmol/L (ref 3.5–5.1)
Sodium: 140 mmol/L (ref 135–145)
Total Bilirubin: 1.4 mg/dL — ABNORMAL HIGH (ref 0.0–1.2)
Total Protein: 7.4 g/dL (ref 6.5–8.1)

## 2024-07-13 LAB — CBC WITH DIFFERENTIAL/PLATELET
Abs Immature Granulocytes: 0.02 K/uL (ref 0.00–0.07)
Basophils Absolute: 0.1 K/uL (ref 0.0–0.1)
Basophils Relative: 1 %
Eosinophils Absolute: 0 K/uL (ref 0.0–0.5)
Eosinophils Relative: 0 %
HCT: 42 % (ref 36.0–46.0)
Hemoglobin: 13.6 g/dL (ref 12.0–15.0)
Immature Granulocytes: 0 %
Lymphocytes Relative: 17 %
Lymphs Abs: 1.2 K/uL (ref 0.7–4.0)
MCH: 29.4 pg (ref 26.0–34.0)
MCHC: 32.4 g/dL (ref 30.0–36.0)
MCV: 90.7 fL (ref 80.0–100.0)
Monocytes Absolute: 0.3 K/uL (ref 0.1–1.0)
Monocytes Relative: 5 %
Neutro Abs: 5.4 K/uL (ref 1.7–7.7)
Neutrophils Relative %: 77 %
Platelets: 277 K/uL (ref 150–400)
RBC: 4.63 MIL/uL (ref 3.87–5.11)
RDW: 12.6 % (ref 11.5–15.5)
WBC: 7 K/uL (ref 4.0–10.5)
nRBC: 0 % (ref 0.0–0.2)

## 2024-07-13 LAB — RAPID URINE DRUG SCREEN, HOSP PERFORMED
Amphetamines: NOT DETECTED
Barbiturates: NOT DETECTED
Benzodiazepines: NOT DETECTED
Cocaine: NOT DETECTED
Opiates: NOT DETECTED
Tetrahydrocannabinol: NOT DETECTED

## 2024-07-13 LAB — CK: Total CK: 166 U/L (ref 38–234)

## 2024-07-13 LAB — ETHANOL: Alcohol, Ethyl (B): <15 mg/dL (ref <15)

## 2024-07-13 LAB — SARS CORONAVIRUS 2 BY RT PCR: SARS Coronavirus 2 by RT PCR: NEGATIVE

## 2024-07-13 LAB — D-DIMER, QUANTITATIVE: D-Dimer, Quant: 0.54 ug{FEU}/mL — ABNORMAL HIGH (ref 0.00–0.50)

## 2024-07-13 LAB — TROPONIN I (HIGH SENSITIVITY)
Troponin I (High Sensitivity): 8 ng/L (ref <18)
Troponin I (High Sensitivity): 9 ng/L (ref <18)

## 2024-07-13 MED ORDER — SODIUM CHLORIDE 0.9 % IV BOLUS
1000.0000 mL | Freq: Once | INTRAVENOUS | Status: AC
  Administered 2024-07-13: 1000 mL via INTRAVENOUS

## 2024-07-13 MED ORDER — OLANZAPINE 5 MG PO TBDP: 2.5000 mg | ORAL_TABLET | Freq: Three times a day (TID) | ORAL | Status: DC | PRN

## 2024-07-13 MED ORDER — ALUM & MAG HYDROXIDE-SIMETH 200-200-20 MG/5ML PO SUSP: 30.0000 mL | Freq: Four times a day (QID) | ORAL | Status: DC | PRN

## 2024-07-13 MED ORDER — ONDANSETRON HCL 4 MG PO TABS
4.0000 mg | ORAL_TABLET | Freq: Three times a day (TID) | ORAL | Status: DC | PRN
Start: 2024-07-13 — End: 2024-07-14

## 2024-07-13 MED ORDER — LEVOTHYROXINE SODIUM 137 MCG PO TABS
137.0000 ug | ORAL_TABLET | Freq: Every day | ORAL | Status: DC
  Administered 2024-07-13 – 2024-07-14 (×2): 137 ug via ORAL
  Filled 2024-07-13 (×2): qty 1

## 2024-07-13 NOTE — ED Provider Notes (Signed)
 [place] EMERGENCY DEPARTMENT AT [facility] Provider Note   CSN: [id number] Arrival date & time: 07/13/24  0209     Patient presents with: Mental Health Problem   Alyssa [name] is a 73 y.o. female.   The history is provided by the patient, the spouse and medical records.  Mental Health Problem Alyssa [name] is a 73 y.o. female who presents to the Emergency Department complaining of fall.  She presents to the emergency department accompanied by her husband for evaluation following a fall.  He states that since her recent ED visit she has only taken 2 of her Klonopin  doses and today she would not take it.  She reports that she is not sleeping.  He states that she is standing all the time.  He was in another room when she was standing and he heard a thud and she had passed out and struck her head.  She was able to get herself back up.  She denies any headache, chest pain, difficulty breathing, nausea, vomiting.  She does report not taking one of her levothyroxine  tablets due to running out of the prescription.  She reports she is eating and drinking but eating and drinking less.  Husband feels like it is far less than she should be consuming.  She denies any SI, HI.      Prior to Admission medications   Medication Sig Start Date End Date Taking? Authorizing Provider  clonazePAM  (KLONOPIN ) 0.5 MG disintegrating tablet Take 1 tablet (0.5 mg total) by mouth 2 (two) times daily. 05/22/24   Donnelly Mellow, MD  Estradiol  10 MCG TABS vaginal tablet Place 1 tablet (10 mcg total) vaginally 2 (two) times a week. 12/13/22   Fredirick Glenys RAMAN, MD  levothyroxine  (SYNTHROID ) 137 MCG tablet Take 1 tablet (137 mcg total) by mouth daily at 6 (six) AM. 05/22/24   Jadapalle, Sree, MD  sertraline  (ZOLOFT ) 25 MG tablet Take 1 tablet (25 mg total) by mouth daily. 05/22/24   Jadapalle, Sree, MD  Travoprost, BAK Free, (TRAVATAN) 0.004 % SOLN ophthalmic solution Place 1 drop into both eyes at bedtime.     [provider]  buPROPion  (WELLBUTRIN  XL) 150 MG 24 hr tablet Take 150 mg by mouth every morning. 07/13/22 09/07/22  [provider]  OLANZapine  zydis (ZYPREXA ) 5 MG disintegrating tablet Take 0.5 tablets (2.5 mg total) by mouth 2 (two) times daily. Patient not taking: Reported on 08/18/2022 06/07/22 09/07/22  Nguyen, Julie, DO    Allergies: Amoxicillin and Clavulanic acid    Review of Systems  All other systems reviewed and are negative.   Updated Vital Signs BP (!) 118/58   Pulse 60   Temp 98.9 F (37.2 C) (Oral)   Resp 18   Ht 5' 8 (1.727 m)   Wt 67.1 kg   SpO2 100%   BMI 22.50 kg/m   Physical Exam Vitals and nursing note reviewed.  Constitutional:      Appearance: She is well-developed.  HENT:     Head: Normocephalic and atraumatic.  Cardiovascular:     Rate and Rhythm: Normal rate and regular rhythm.     Heart sounds: No murmur heard. Pulmonary:     Effort: Pulmonary effort is normal. No respiratory distress.     Breath sounds: Normal breath sounds.  Abdominal:     Palpations: Abdomen is soft.     Tenderness: There is no abdominal tenderness. There is no guarding or rebound.  Musculoskeletal:  General: No tenderness.     Cervical back: Neck supple.  Skin:    General: Skin is warm and dry.  Neurological:     Mental Status: She is alert and oriented to person, place, and time.     Comments: 5 out of 5 strength in all 4 extremities with sensation light touch intact in all 4 extremities.  Psychiatric:     Comments: Appears anxious     (all labs ordered are listed, but only abnormal results are displayed) Labs Reviewed  COMPREHENSIVE METABOLIC PANEL WITH GFR - Abnormal; Notable for the following components:      Result Value   Glucose, Bld 107 (*)    BUN 36 (*)    Total Bilirubin 1.4 (*)    All other components within normal limits  URINALYSIS, ROUTINE W REFLEX MICROSCOPIC - Abnormal; Notable for the following components:   Color, Urine  AMBER (*)    APPearance HAZY (*)    Ketones, ur 20 (*)    Protein, ur 100 (*)    Leukocytes,Ua SMALL (*)    Bacteria, UA RARE (*)    All other components within normal limits  D-DIMER, QUANTITATIVE - Abnormal; Notable for the following components:   D-Dimer, Quant 0.54 (*)    All other components within normal limits  URINE CULTURE  ETHANOL  CBC WITH DIFFERENTIAL/PLATELET  CK  TROPONIN I (HIGH SENSITIVITY)  TROPONIN I (HIGH SENSITIVITY)    EKG: EKG Interpretation Date/Time:  Monday July 13 2024 02:37:46 EDT Ventricular Rate:  86 PR Interval:  144 QRS Duration:  115 QT Interval:  382 QTC Calculation: 457 R Axis:   68  Text Interpretation: Sinus rhythm Nonspecific intraventricular conduction delay Repol abnrm suggests ischemia, diffuse leads Confirmed by Griselda Norris [phone]) on 07/13/2024 2:39:37 AM  Radiology: CT Head Wo Contrast Result Date: 07/13/2024 EXAM: CT HEAD WITHOUT 07/13/2024 02:58:03 AM TECHNIQUE: CT of the head was performed without the administration of intravenous contrast. Automated exposure control, iterative reconstruction, and/or weight based adjustment of the mA/kV was utilized to reduce the radiation dose to as low as reasonably achievable. COMPARISON: 11/26/2021 CLINICAL HISTORY: Head trauma, minor (Age >= 65y). Pt was seen on Friday for aggression and not eating. Husband said since then the problems have gotten worse. She is not eating well, not being compliant with medication, and is standing for long periods of time. Today she was standing in front of the television for 3+ hours and then fell onto the floor hitting her head. FINDINGS: BRAIN AND VENTRICLES: No acute intracranial hemorrhage. No mass effect or midline shift. No extra-axial fluid collection. Gray-white differentiation is maintained. No hydrocephalus. ORBITS: No acute abnormality. SINUSES AND MASTOIDS: No acute abnormality. SOFT TISSUES AND SKULL: No acute skull fracture. No acute soft tissue  abnormality. IMPRESSION: 1. No acute intracranial abnormality. Electronically signed by: Franky Stanford MD 07/13/2024 03:05 AM EDT RP Workstation: [id number]     Procedures   Medications Ordered in the ED  ondansetron  (ZOFRAN ) tablet 4 mg (has no administration in time range)  alum & mag hydroxide-simeth (MAALOX/MYLANTA) 200-200-20 MG/5ML suspension 30 mL (has no administration in time range)  levothyroxine  (SYNTHROID ) tablet 137 mcg (137 mcg Oral Given 07/13/24 0609)  sodium chloride  0.9 % bolus 1,000 mL (0 mLs Intravenous Stopped 07/13/24 0436)                                    Medical Decision  Making Amount and/or Complexity of Data Reviewed Labs: ordered. Radiology: ordered.  Risk OTC drugs. Prescription drug management.   Patient with history of hypothyroidism, major depressive disorder, anxiety here accompanied by her husband for evaluation of severe and worsening anxiety and insomnia to the point that she is standing all the time, not eating, drinking less and she had a syncopal episode today.  On examination she is nontoxic-appearing with no focal neurologic deficits.  CT head is negative for acute abnormality.  EKG is abnormal but similar when compared to priors.  Troponins are negative x 2.  Current picture is not consistent with PE, D-dimer is negative based off of age adjustment and patient without respiratory symptoms.  BUN is elevated, consistent with dehydration per report.  She was treated with IV fluids.  Otherwise labs are stable.  Concern for severe depression and anxiety, which is contributing to poor oral intake.  Feel she will benefit from ongoing psychiatric care as she is not functioning at home.  No evidence of myxedema coma at this time.  She is medically cleared for psychiatric evaluation and treatment. UA equivocal, pt without dysuria.  Will send culture.  Would treat if culture is positive.      Final diagnoses:  None    ED Discharge Orders     None           Griselda Norris, MD 07/13/24 2316

## 2024-07-13 NOTE — ED Triage Notes (Signed)
 Pt was seen on Friday for aggression and not eating. Husband said since then the problems have gotten worse. She is not eating well, not being compliant with medication, and is standing for long periods of time. Today she was standing in front of the television for 3 + hours and then fell onto the floor hitting her head.
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
## 2024-07-13 NOTE — ED Notes (Signed)
 TTS at bedside.

## 2024-07-13 NOTE — Progress Notes (Signed)
 Pt was accepted to [facility] 07/13/2024 Bed Assignment  27  Address: [address] [place], [place], [place] [address]  -[facility] Fax: [phone]  Pt meets inpatient criteria per: Jadeka Mangrum   Attending Physician will be Dr. Allyn Donnelly COME  Report can be called to: [phone]  Pt can arrive after [facility] WILL UPDATE   Care Team notified: Cherylynn Ernst RN, Cathaleen Jacobson NP, Chesley Holt [facility], Devere Mulch RN      [place] Kingdavid Leinbach, MSW, [facility] 07/13/2024 3:49 PM

## 2024-07-13 NOTE — ED Notes (Signed)
 Pt belongings removed with NT.   Belongings removed: pair blue socks, pair slid on shoes, jeans, 1 underwear, 1 bra, and 1 tshirt.   Pt dressed into burgundy purple scrubs. Has been wanded by security

## 2024-07-13 NOTE — ED Notes (Signed)
 Pt has one pt belong bag in the cabinet.

## 2024-07-13 NOTE — ED Notes (Signed)
 Patient transported to CT

## 2024-07-14 ENCOUNTER — Other Ambulatory Visit: Payer: Self-pay

## 2024-07-14 ENCOUNTER — Encounter: Payer: Self-pay | Admitting: Psychiatry

## 2024-07-14 ENCOUNTER — Inpatient Hospital Stay
Admission: AD | Admit: 2024-07-14 | Discharge: 2024-07-27 | DRG: 885 | Disposition: A | Discharge status: Home / Self Care | Source: Intra-hospital | Attending: Psychiatry | Admitting: Psychiatry

## 2024-07-14 DIAGNOSIS — F411 Generalized anxiety disorder: Secondary | ICD-10-CM | POA: Diagnosis present

## 2024-07-14 DIAGNOSIS — F40240 Claustrophobia: Secondary | ICD-10-CM | POA: Diagnosis present

## 2024-07-14 DIAGNOSIS — Z923 Personal history of irradiation: Secondary | ICD-10-CM | POA: Diagnosis not present

## 2024-07-14 DIAGNOSIS — I10 Essential (primary) hypertension: Secondary | ICD-10-CM | POA: Diagnosis present

## 2024-07-14 DIAGNOSIS — Z880 Allergy status to penicillin: Secondary | ICD-10-CM

## 2024-07-14 DIAGNOSIS — Z79890 Hormone replacement therapy (postmenopausal): Secondary | ICD-10-CM

## 2024-07-14 DIAGNOSIS — F333 Major depressive disorder, recurrent, severe with psychotic symptoms: Secondary | ICD-10-CM | POA: Diagnosis present

## 2024-07-14 DIAGNOSIS — H409 Unspecified glaucoma: Secondary | ICD-10-CM | POA: Diagnosis present

## 2024-07-14 DIAGNOSIS — E890 Postprocedural hypothyroidism: Secondary | ICD-10-CM | POA: Diagnosis present

## 2024-07-14 DIAGNOSIS — Z85850 Personal history of malignant neoplasm of thyroid: Secondary | ICD-10-CM | POA: Diagnosis not present

## 2024-07-14 DIAGNOSIS — Z888 Allergy status to other drugs, medicaments and biological substances status: Secondary | ICD-10-CM | POA: Diagnosis not present

## 2024-07-14 DIAGNOSIS — Z808 Family history of malignant neoplasm of other organs or systems: Secondary | ICD-10-CM

## 2024-07-14 DIAGNOSIS — F321 Major depressive disorder, single episode, moderate: Secondary | ICD-10-CM | POA: Diagnosis not present

## 2024-07-14 DIAGNOSIS — Z8249 Family history of ischemic heart disease and other diseases of the circulatory system: Secondary | ICD-10-CM | POA: Diagnosis not present

## 2024-07-14 DIAGNOSIS — F19239 Other psychoactive substance dependence with withdrawal, unspecified: Secondary | ICD-10-CM | POA: Diagnosis not present

## 2024-07-14 DIAGNOSIS — F329 Major depressive disorder, single episode, unspecified: Secondary | ICD-10-CM | POA: Diagnosis not present

## 2024-07-14 DIAGNOSIS — Z79899 Other long term (current) drug therapy: Secondary | ICD-10-CM

## 2024-07-14 DIAGNOSIS — Z833 Family history of diabetes mellitus: Secondary | ICD-10-CM

## 2024-07-14 DIAGNOSIS — F13231 Sedative, hypnotic or anxiolytic dependence with withdrawal delirium: Secondary | ICD-10-CM | POA: Diagnosis present

## 2024-07-14 DIAGNOSIS — F418 Other specified anxiety disorders: Secondary | ICD-10-CM | POA: Diagnosis not present

## 2024-07-14 LAB — URINE CULTURE

## 2024-07-14 MED ORDER — DIPHENHYDRAMINE HCL 25 MG PO CAPS: 50.0000 mg | ORAL_CAPSULE | Freq: Three times a day (TID) | ORAL | Status: DC | PRN

## 2024-07-14 MED ORDER — LORAZEPAM 2 MG/ML IJ SOLN: 2.0000 mg | Freq: Three times a day (TID) | INTRAMUSCULAR | Status: DC | PRN

## 2024-07-14 MED ORDER — LEVOTHYROXINE SODIUM 137 MCG PO TABS
137.0000 ug | ORAL_TABLET | Freq: Every day | ORAL | Status: DC
Start: 2024-07-15 — End: 2024-07-27
  Administered 2024-07-15 – 2024-07-27 (×14): 137 ug via ORAL
  Filled 2024-07-14 (×14): qty 1

## 2024-07-14 MED ORDER — HALOPERIDOL 5 MG PO TABS: 5.0000 mg | ORAL_TABLET | Freq: Three times a day (TID) | ORAL | Status: DC | PRN

## 2024-07-14 MED ORDER — DIPHENHYDRAMINE HCL 50 MG/ML IJ SOLN: 50.0000 mg | Freq: Three times a day (TID) | INTRAMUSCULAR | Status: DC | PRN

## 2024-07-14 MED ORDER — HALOPERIDOL LACTATE 5 MG/ML IJ SOLN: 5.0000 mg | Freq: Three times a day (TID) | INTRAMUSCULAR | Status: DC | PRN

## 2024-07-14 MED ORDER — TRAZODONE HCL 50 MG PO TABS
50.0000 mg | ORAL_TABLET | Freq: Every evening | ORAL | Status: DC | PRN
  Administered 2024-07-14 – 2024-07-26 (×16): 50 mg via ORAL
  Filled 2024-07-14 (×15): qty 1

## 2024-07-14 MED ORDER — HALOPERIDOL LACTATE 5 MG/ML IJ SOLN: 10.0000 mg | Freq: Three times a day (TID) | INTRAMUSCULAR | Status: DC | PRN

## 2024-07-14 MED ORDER — ACETAMINOPHEN 325 MG PO TABS: 650.0000 mg | ORAL_TABLET | Freq: Four times a day (QID) | ORAL | Status: DC | PRN

## 2024-07-14 MED ORDER — ALUM & MAG HYDROXIDE-SIMETH 200-200-20 MG/5ML PO SUSP: 30.0000 mL | ORAL | Status: DC | PRN

## 2024-07-14 MED ORDER — OLANZAPINE 5 MG PO TBDP
2.5000 mg | ORAL_TABLET | Freq: Three times a day (TID) | ORAL | Status: DC | PRN
  Administered 2024-07-17: 2.5 mg via ORAL
  Filled 2024-07-14: qty 1

## 2024-07-14 MED ORDER — HYDROXYZINE HCL 25 MG PO TABS
25.0000 mg | ORAL_TABLET | Freq: Three times a day (TID) | ORAL | Status: DC | PRN
  Administered 2024-07-17 – 2024-07-18 (×2): 25 mg via ORAL
  Filled 2024-07-14 (×4): qty 1

## 2024-07-14 MED ORDER — MAGNESIUM HYDROXIDE 400 MG/5ML PO SUSP
30.0000 mL | Freq: Every day | ORAL | Status: DC | PRN
  Administered 2024-07-19 – 2024-07-26 (×2): 30 mL via ORAL
  Filled 2024-07-14 (×2): qty 30

## 2024-07-14 NOTE — Group Note (Signed)
 [facility] LCSW Group Therapy Note   Group Date: 07/14/2024 Start Time: 1330 End Time: 1400   Type of Therapy/Topic:  Group Therapy:  Emotion Regulation  Participation Level:  Minimal   Mood:  Description of Group:    The purpose of this group is to assist patients in learning to regulate negative emotions and experience positive emotions. Patients will be guided to discuss ways in which they have been vulnerable to their negative emotions. These vulnerabilities will be juxtaposed with experiences of positive emotions or situations, and patients challenged to use positive emotions to combat negative ones. Special emphasis will be placed on coping with negative emotions in conflict situations, and patients will process healthy conflict resolution skills.  Therapeutic Goals: Patient will identify two positive emotions or experiences to reflect on in order to balance out negative emotions:  Patient will label two or more emotions that they find the most difficult to experience:  Patient will be able to demonstrate positive conflict resolution skills through discussion or role plays:   Summary of Patient Progress:   Pt participated in activity minimally. Pt seemed apprehensive to participate but did  so given encouragement from peers    Therapeutic Modalities:   Cognitive Behavioral Therapy Feelings Identification Dialectical Behavioral Therapy   Alyssa [name], LCSWA

## 2024-07-14 NOTE — Progress Notes (Signed)
 07/14/2024  0729  Called [facility] [phone] to give report . Waiting on a return call to give report

## 2024-07-14 NOTE — Group Note (Signed)
 Recreation Therapy Group Note   Group Topic:Other  Group Date: 07/14/2024 Start Time: 1100 End Time: 1120 Facilitators: Celestia Jeoffrey BRAVO, LRT, CTRS Location: Dayroom  Activity Description/Intervention: Therapeutic Drumming. Patients with peers and staff were given the opportunity to engage in a leader facilitated [facility] Group Empowerment Drumming Circle with staff from the [employer], in partnership with [employer]. Teaching laboratory technician and trained [employer], [name] Mon leading with LRT observing and documenting intervention and pt response. This evidenced-based practice targets 7 areas of health and wellbeing in the human experience including: stress-reduction, exercise, self-expression, camaraderie/support, nurturing, spirituality, and music-making (leisure).    Goal Area(s) Addresses:  Patient will engage in pro-social way in music group.  Patient will follow directions of drum leader on the first prompt. Patient will demonstrate no behavioral issues during group.  Patient will identify if a reduction in stress level occurs as a result of participation in therapeutic drum circle.     Affect/Mood: N/A   Participation Level: N/A    Clinical Observations/Individualized Feedback: Patient was not on the unit at the time of group.   Plan: Continue to engage patient in RT group sessions 2-3x/week.   Jeoffrey BRAVO Celestia, LRT, CTRS 07/14/2024 1:57 PM

## 2024-07-14 NOTE — Progress Notes (Signed)
   07/14/24 1321  Psych Admission Type (Psych Patients Only)  Admission Status Involuntary  Psychosocial Assessment  Patient Complaints Anxiety;Depression  Eye Contact Fair  Facial Expression Anxious;Sad  Affect Flat  Speech Soft  Interaction Minimal  Motor Activity Slow  Appearance/Hygiene In scrubs  Behavior Characteristics Cooperative;Calm  Mood Depressed;Anxious;Sad  Thought Process  Coherency Disorganized  Content WDL  Delusions None reported or observed  Perception WDL  Hallucination None reported or observed  Judgment Impaired  Confusion Mild  Danger to Self  Current suicidal ideation? Denies  Danger to Others  Danger to Others None reported or observed   Patient is alert to person, denies pain or discomfort. The patient reports a worsening of depressive symptoms, prompting an initial presentation to an outside hospital emergency department before transfer to this facility for further psychiatric evaluation and treatment. This is not the patient's first psychiatric admission. The patient denies suicidal ideation (SI), homicidal ideation (HI), and auditory or visual hallucinations (AVH).  The patient reported falling two days prior to admission, hitting their head but denying loss of consciousness or significant injury. No current pain was reported. The patient is able to ambulate independently without assistance. Lives at home with spouse, identified as the primary support system.

## 2024-07-14 NOTE — Group Note (Signed)
 Recreation Therapy Group Note   Group Topic:Stress Management  Group Date: 07/14/2024 Start Time: 1500 End Time: 1540 Facilitators: Celestia Jeoffrey BRAVO, LRT, CTRS Location: Dayroom  Group Description: Meditation. LRT and patients discussed what they know about meditation and mindfulness. LRT played a Deep Breathing Meditation exercise script for patients to follow along to. LRT and patients discussed how meditation and deep breathing can be used as a coping skill post--discharge to help manage symptoms of stress.    Goal Area(s) Addressed: Patient will practice using relaxation technique. Patient will identify a new coping skill.  Patient will follow multistep directions to reduce anxiety and stress.   Affect/Mood: Flat   Participation Level: Non-verbal    Clinical Observations/Individualized Feedback: Peony was somewhat active in their participation of session activities and group discussion. Pt did not interact with LRT or peers while present.    Plan: Continue to engage patient in RT group sessions 2-3x/week.   Jeoffrey BRAVO Celestia, LRT, CTRS 07/14/2024 4:08 PM

## 2024-07-14 NOTE — Progress Notes (Signed)
 07/14/2024  0725 Called [facility] to transport patient to [facility]. Left message for return call.

## 2024-07-14 NOTE — ED Notes (Signed)
 Patient asleep at this time.

## 2024-07-14 NOTE — Consult Note (Signed)
 [facility] Consult Follow-up  Patient Name: .Alyssa [name]  MRN: [id number]  DOB: 01-03-1951  Consult Order details:  Orders (From admission, onward)     Start     Ordered   07/13/24 0515  CONSULT TO CALL ACT TEAM       Ordering Provider: Griselda Norris, MD  Provider:  (Not yet assigned)  Question:  Reason for Consult?  Answer:  severe depression   07/13/24 0514             Mode of Visit: In person    Psychiatry Consult Evaluation  Service Date: July 14, 2024 LOS:  LOS: 0 days  Chief Complaint altered mental status   Primary Psychiatric Diagnoses  MDD with psychosis  GAD  Assessment  Alyssa [name] is a 73 y.o. female admitted: Presented to the ED on 07/13/2024  2:18 AM for  having episodes non stop hours of pacing, fidgeting with hands, saying words that do not make sense, and paranoia. 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 She meets criteria for inpatient psychiatric admission. Current outpatient psychotropic medications include Klonopin , Zoloft  and historically she has had a beneficial response to these medications. She was non compliant with medications prior to admission as evidenced by patient spouse. On initial examination, patient is restless, anxious, and talking nonsensical, asking her husband to finish her thoughts. Please see plan below for detailed recommendations.   Diagnoses:  Active Hospital problems: Active Problems:   Anxiety and depression   MDD (major depressive disorder), recurrent, severe, with psychosis (HCC)     Plan   ## Psychiatric Medication Recommendations:  Continue patient's home medications   ## Medical Decision Making Capacity: Not specifically addressed in this encounter   ## Further Work-up:  -- Ordered urinalysis to rule out UTI EKG or U/A -- EKG Qtc on 07/13/2024 was 457 -- Pertinent labwork reviewed earlier this admission includes: CBC, TSH, CMP, EKG, UA, UDS     ## Disposition:-- We recommend inpatient psychiatric hospitalization. Patient has been involuntarily committed on 05/14/24.    ## Behavioral / Environmental: -To minimize splitting of staff, assign one staff person to communicate all information from the team when feasible. or Utilize compassion and acknowledge the patient's experiences while setting clear and realistic expectations for care.                ## Safety and Observation Level:  - Based on my clinical evaluation, I estimate the patient to be at low risk of self harm in the current setting. - At this time, we recommend  routine. This decision is based on my review of the chart including patient's history and current presentation, interview of the patient, mental status examination, and consideration of suicide risk including evaluating suicidal ideation, plan, intent, suicidal or self-harm behaviors, risk factors, and protective factors. This judgment is based on our ability to directly address suicide risk, implement suicide prevention strategies, and develop a safety plan while the patient is in the clinical setting. Please contact our team if there is a concern that risk level has changed.   CSSR Risk Category:C-SSRS RISK CATEGORY: No Risk   Suicide Risk Assessment:  Patient has following modifiable risk factors for suicide: under treated depression  and recklessness, which we are addressing by recommending inpatient psychiatric admission. Patient has following non-modifiable or demographic risk factors for suicide: psychiatric hospitalization Patient has the  following protective factors against suicide: Access to outpatient mental health care and Supportive family   Thank you for this consult request. Recommendations have been communicated to the primary team.  We will recommend inpatient psychiatric admission at this time.    CATHALEEN ADAM, PMHNP       History of Present Illness  Relevant Aspects of Hospital ED Course:  Admitted on 07/13/2024 73 y.o. female who presents to the Emergency Department complaining of fall.  She presents to the emergency department accompanied by her husband for evaluation following a fall.  He states that since her recent ED visit she has only taken 2 of her Klonopin  doses and today she would not take it.  She reports that she is not sleeping.  He states that she is standing all the time.  He was in another room when she was standing and he heard a thud and she had passed out and struck her head.  She was able to get herself back up.  She denies any headache, chest pain, difficulty breathing, nausea, vomiting.  She does report not taking one of her levothyroxine  tablets due to running out of the prescription.  She reports she is eating and drinking but eating and drinking less.  Husband feels like it is far less than she should be consuming.  She denies any SI, HI.     Patient Report: Alyssa [name], 73 y.o., female patient seen face to face by this provider, consulted with Dr. Merilee; and chart reviewed on 07/14/24.  On evaluation Alyssa [name]  today, the patient is sitting up in bed with head of bed elevated.  She is calm and cooperative during this assessment. Her appearance is appropriate for environment. Her eye contact is good. Speech is clear and coherent, normal pace and normal volume. She is alert and oriented x4 to person, place, time, and situation. She continues to report restlessness, and difficulty remembering things. She states she feels like her medication is causing side effects such as  difficulty with decision-making. Affect is congruent with mood. Thought process is coherent and tangential.  Thought content is slightly tangential.  She denies auditory and visual hallucinations.  No indication that she is responding to internal stimuli during this assessment. No delusions elicited during this assessment.  She denies suicidal ideations and homicidal ideations. Appetite and sleep are fair.      Psych ROS:  Depression: Denies  Anxiety: Endorses Mania (lifetime and current): Denies  Psychosis: (lifetime and current): Yes    Review of Systems  Psychiatric/Behavioral:  Positive for depression, hallucinations and suicidal ideas.      Psychiatric and Social History  Psychiatric History:  Information collected from patient and chart review   Prev Dx/Sx: Bipolar, MDD Current Psych Provider: Dr. Rosina Staff  Home Meds (current): Klonopin , Lexapro , Ativan , Seroquel , and trazodone  Previous Med Trials: Yes Therapy: Yes   Prior Psych Hospitalization: Yes Prior Self Harm: Denies Prior Violence: Yes   Family Psych History: Yes Family Hx suicide: Denies   Social History:  Developmental Hx: Age appropriate  Educational Hx: Graduated high school Occupational Hx: Retired Armed forces operational officer Hx: Denies  Living Situation: Lives with spouse  Spiritual Hx:  Yes Access to weapons/lethal means: Denies     Substance History Alcohol: Yes  Type of alcohol beer, wine Last Drink per husband and patient has not had any wine in months Number of drinks per day beer denies History of alcohol withdrawal seizures Denies  History of DT's Denies  Tobacco: Denies  Illicit drugs: Denies  Prescription drug abuse: Denies  Rehab hx: Denies   Exam Findings  Physical Exam:  Vital Signs:  Temp:  [98.4 F (36.9 C)-98.9 F (37.2 C)] 98.5 F (36.9 C) (08/05 0625) Pulse Rate:  [57-69] 69 (08/05 0625) Resp:  [16-18] 16 (08/05 0625) BP: (107-149)/(64-91) 149/91 (08/05 0625) SpO2:  [97 %-99 %] 99 %  (08/05 0625) Blood pressure (!) 149/91, pulse 69, temperature 98.5 F (36.9 C), temperature source Oral, resp. rate 16, height 5' 8 (1.727 m), weight 67.1 kg, SpO2 99%. Body mass index is 22.5 kg/m.  Physical Exam Vitals and nursing note reviewed. Exam conducted with a chaperone present.  Neurological:     Mental Status: She is alert.  Psychiatric:        Attention and Perception: Attention normal.        Mood and Affect: Mood is anxious and depressed. Affect is flat.        Speech: Speech normal.        Behavior: Behavior is cooperative.        Thought Content: Thought content is delusional.        Cognition and Memory: Cognition is impaired.        Judgment: Judgment is inappropriate.     Mental Status Exam: General Appearance: Casual  Orientation:  Full (Time, Place, and Person)  Memory:  Immediate;   Fair Remote;   Fair  Concentration:  Concentration: Fair and Attention Span: Fair  Recall:  Good  Attention  Fair  Eye Contact:  Good  Speech:  Clear and Coherent  Language:  Good  Volume:  Normal  Mood: sad  Affect:  Congruent  Thought Process:  Coherent and tangential at times  Thought Content:  tangential  Suicidal Thoughts:  No  Homicidal Thoughts:  No  Judgement:  Fair  Insight:  Good  Psychomotor Activity:  Normal  Akathisia:  No  Fund of Knowledge:  Fair   Assets:  Manufacturing systems engineer Desire for Improvement Financial Resources/Insurance Housing Social Support  Cognition:  WNL  ADL's:  Intact  AIMS (if indicated):        Other History   These have been pulled in through the EMR, reviewed, and updated if appropriate.  Family History:  The patient's family history includes Diabetes in her paternal grandmother; Hypertension in her sister; Skin cancer in her brother; Thyroid  cancer in her brother.  Medical History: Past Medical History:  Diagnosis Date   Anxiety and depression    Arthritis    Cervicogenic headache    Claustrophobia    Glaucoma     Hypertension    Lumbar radiculopathy    Postsurgical hypothyroidism    Spinal stenosis    Thyroid  cancer [facility])    s/p thyroidectomy    Surgical History: Past Surgical History:  Procedure Laterality Date   FOOT SURGERY     LAMINOTOMY  04/03/2021   LEFT L3/4 LAMINOTOMY BILATERAL L3/4 MEDIAL FACETECTOMY   LUMBAR LAMINECTOMY  03/03/2018   THYROIDECTOMY  12/2004     Medications:   Current Facility-Administered Medications:    alum & mag hydroxide-simeth (MAALOX/MYLANTA) 200-200-20 MG/5ML suspension 30 mL, 30 mL, Oral, Q6H PRN, Griselda Norris, MD   levothyroxine  (SYNTHROID ) tablet 137 mcg, 137 mcg, Oral, Q0600, Griselda Norris,  MD, 137 mcg at 07/13/24 0609   OLANZapine  zydis (ZYPREXA ) disintegrating tablet 2.5 mg, 2.5 mg, Oral, Q8H PRN, Griselda Norris, MD   ondansetron  (ZOFRAN ) tablet 4 mg, 4 mg, Oral, Q8H PRN, Griselda Norris, MD  Current Outpatient Medications:    levothyroxine  (SYNTHROID ) 137 MCG tablet, Take 1 tablet (137 mcg total) by mouth daily at 6 (six) AM., Disp: 30 tablet, Rfl: 0   Travoprost, BAK Free, (TRAVATAN) 0.004 % SOLN ophthalmic solution, Place 1 drop into both eyes at bedtime., Disp: , Rfl:    clonazePAM  (KLONOPIN ) 0.5 MG disintegrating tablet, Take 1 tablet (0.5 mg total) by mouth 2 (two) times daily. (Patient not taking: Reported on 07/13/2024), Disp: 60 tablet, Rfl: 0   sertraline  (ZOLOFT ) 25 MG tablet, Take 1 tablet (25 mg total) by mouth daily. (Patient not taking: Reported on 07/13/2024), Disp: 30 tablet, Rfl: 0  Allergies: Allergies  Allergen Reactions   Amoxicillin Hives   Clavulanic Acid Other (See Comments)    Family is unaware of this     CATHALEEN ADAM, PMHNP

## 2024-07-14 NOTE — Progress Notes (Signed)
 07/14/2024  0751  Muraine Called back from [facility]. Report was given.

## 2024-07-14 NOTE — Plan of Care (Signed)
  Problem: Self-Concept: Goal: Ability to identify factors that promote anxiety will improve Outcome: Progressing Goal: Level of anxiety will decrease Outcome: Progressing Goal: Ability to modify response to factors that promote anxiety will improve Outcome: Progressing   Problem: Education: Goal: Utilization of techniques to improve thought processes will improve Outcome: Progressing Goal: Knowledge of the prescribed therapeutic regimen will improve Outcome: Progressing

## 2024-07-14 NOTE — ED Provider Notes (Signed)
 Emergency Medicine Observation Re-evaluation Note  Alyssa [name] is a 73 y.o. female, seen on rounds today.  Pt initially presented to the ED for complaints of Mental Health Problem Currently, the patient is awaitin gplacement.  Physical Exam  BP (!) 149/91 (BP Location: Left Arm)   Pulse 69   Temp 98.5 F (36.9 C) (Oral)   Resp 16   Ht 5' 8 (1.727 m)   Wt 67.1 kg   SpO2 99%   BMI 22.50 kg/m  Physical Exam General: Calm Cardiac: Well perfused Lungs: Even respirations Psych: Calm  ED Course / MDM  EKG:EKG Interpretation Date/Time:  Monday July 13 2024 02:37:46 EDT Ventricular Rate:  86 PR Interval:  144 QRS Duration:  115 QT Interval:  382 QTC Calculation: 457 R Axis:   68  Text Interpretation: Sinus rhythm Nonspecific intraventricular conduction delay Repol abnrm suggests ischemia, diffuse leads Confirmed by Alyssa [name] [phone]) on 07/13/2024 2:39:37 AM  I have reviewed the labs performed to date as well as medications administered while in observation.  Recent changes in the last 24 hours include patient accepted to [facility] psych.  Plan  Current plan is for admit to [facility]; Dr. Jadapalle.    Alyssa Fonda MATSU, MD 07/15/24 [phone]

## 2024-07-15 DIAGNOSIS — F329 Major depressive disorder, single episode, unspecified: Secondary | ICD-10-CM | POA: Diagnosis not present

## 2024-07-15 NOTE — BH IP Treatment Plan (Signed)
 Interdisciplinary Treatment and Diagnostic Plan Update  07/15/2024 Time of Session: 10:16 AM  Alyssa [name] MRN: [id number]  Principal Diagnosis: MDD (major depressive disorder)  Secondary Diagnoses: Principal Problem:   MDD (major depressive disorder)   Current Medications:  Current Facility-Administered Medications  Medication Dose Route Frequency Provider Last Rate Last Admin   acetaminophen  (TYLENOL ) tablet 650 mg  650 mg Oral Q6H PRN Motley-Mangrum, Jadeka A, PMHNP       alum & mag hydroxide-simeth (MAALOX/MYLANTA) 200-200-20 MG/5ML suspension 30 mL  30 mL Oral Q4H PRN Motley-Mangrum, Jadeka A, PMHNP       haloperidol  (HALDOL ) tablet 5 mg  5 mg Oral TID PRN Motley-Mangrum, Jadeka A, PMHNP       And   diphenhydrAMINE  (BENADRYL ) capsule 50 mg  50 mg Oral TID PRN Motley-Mangrum, Jadeka A, PMHNP       haloperidol  lactate (HALDOL ) injection 5 mg  5 mg Intramuscular TID PRN Motley-Mangrum, Jadeka A, PMHNP       And   diphenhydrAMINE  (BENADRYL ) injection 50 mg  50 mg Intramuscular TID PRN Motley-Mangrum, Jadeka A, PMHNP       And   LORazepam  (ATIVAN ) injection 2 mg  2 mg Intramuscular TID PRN Motley-Mangrum, Jadeka A, PMHNP       haloperidol  lactate (HALDOL ) injection 10 mg  10 mg Intramuscular TID PRN Motley-Mangrum, Jadeka A, PMHNP       And   diphenhydrAMINE  (BENADRYL ) injection 50 mg  50 mg Intramuscular TID PRN Motley-Mangrum, Jadeka A, PMHNP       And   LORazepam  (ATIVAN ) injection 2 mg  2 mg Intramuscular TID PRN Motley-Mangrum, Jadeka A, PMHNP       hydrOXYzine  (ATARAX ) tablet 25 mg  25 mg Oral TID PRN Motley-Mangrum, Jadeka A, PMHNP       levothyroxine  (SYNTHROID ) tablet 137 mcg  137 mcg Oral Q0600 Motley-Mangrum, Jadeka A, PMHNP   137 mcg at 07/15/24 0645   magnesium  hydroxide (MILK OF MAGNESIA) suspension 30 mL  30 mL Oral Daily PRN Motley-Mangrum, Jadeka A, PMHNP       OLANZapine  zydis (ZYPREXA ) disintegrating tablet 2.5 mg  2.5 mg Oral Q8H PRN Motley-Mangrum, Jadeka A,  PMHNP       traZODone  (DESYREL ) tablet 50 mg  50 mg Oral QHS PRN Motley-Mangrum, Jadeka A, PMHNP   50 mg at 07/14/24 2201   PTA Medications: Medications Prior to Admission  Medication Sig Dispense Refill Last Dose/Taking   clonazePAM  (KLONOPIN ) 0.5 MG disintegrating tablet Take 1 tablet (0.5 mg total) by mouth 2 (two) times daily. (Patient not taking: Reported on 07/13/2024) 60 tablet 0    levothyroxine  (SYNTHROID ) 137 MCG tablet Take 1 tablet (137 mcg total) by mouth daily at 6 (six) AM. 30 tablet 0    sertraline  (ZOLOFT ) 25 MG tablet Take 1 tablet (25 mg total) by mouth daily. (Patient not taking: Reported on 07/13/2024) 30 tablet 0    Travoprost, BAK Free, (TRAVATAN) 0.004 % SOLN ophthalmic solution Place 1 drop into both eyes at bedtime.       Patient Stressors:    Patient Strengths:    Treatment Modalities: Medication Management, Group therapy, Case management,  1 to 1 session with clinician, Psychoeducation, Recreational therapy.   Physician Treatment Plan for Primary Diagnosis: MDD (major depressive disorder) Long Term Goal(s):     Short Term Goals:    Medication Management: Evaluate patient's response, side effects, and tolerance of medication regimen.  Therapeutic Interventions: 1 to 1 sessions, Unit Group sessions and Medication  administration.  Evaluation of Outcomes: Not Progressing  Physician Treatment Plan for Secondary Diagnosis: Principal Problem:   MDD (major depressive disorder)  Long Term Goal(s):     Short Term Goals:       Medication Management: Evaluate patient's response, side effects, and tolerance of medication regimen.  Therapeutic Interventions: 1 to 1 sessions, Unit Group sessions and Medication administration.  Evaluation of Outcomes: Not Progressing   RN Treatment Plan for Primary Diagnosis: MDD (major depressive disorder) Long Term Goal(s): Knowledge of disease and therapeutic regimen to maintain health will improve  Short Term Goals: Ability  to remain free from injury will improve, Ability to verbalize frustration and anger appropriately will improve, Ability to demonstrate self-control, Ability to participate in decision making will improve, Ability to verbalize feelings will improve, Ability to disclose and discuss suicidal ideas, Ability to identify and develop effective coping behaviors will improve, and Compliance with prescribed medications will improve  Medication Management: RN will administer medications as ordered by provider, will assess and evaluate patient's response and provide education to patient for prescribed medication. RN will report any adverse and/or side effects to prescribing provider.  Therapeutic Interventions: 1 on 1 counseling sessions, Psychoeducation, Medication administration, Evaluate responses to treatment, Monitor vital signs and CBGs as ordered, Perform/monitor CIWA, COWS, AIMS and Fall Risk screenings as ordered, Perform wound care treatments as ordered.  Evaluation of Outcomes: Not Progressing   LCSW Treatment Plan for Primary Diagnosis: MDD (major depressive disorder) Long Term Goal(s): Safe transition to appropriate next level of care at discharge, Engage patient in therapeutic group addressing interpersonal concerns.  Short Term Goals: Engage patient in aftercare planning with referrals and resources, Increase social support, Increase ability to appropriately verbalize feelings, Increase emotional regulation, Facilitate acceptance of mental health diagnosis and concerns, Facilitate patient progression through stages of change regarding substance use diagnoses and concerns, Identify triggers associated with mental health/substance abuse issues, and Increase skills for wellness and recovery  Therapeutic Interventions: Assess for all discharge needs, 1 to 1 time with Social worker, Explore available resources and support systems, Assess for adequacy in community support network, Educate family and  significant other(s) on suicide prevention, Complete Psychosocial Assessment, Interpersonal group therapy.  Evaluation of Outcomes: Not Progressing   Progress in Treatment: Attending groups: Yes. and No. Participating in groups: Yes. and No. Taking medication as prescribed: Yes. Toleration medication: Yes. Family/Significant other contact made: No, will contact:  CSW will contact if given permission  Patient understands diagnosis: Yes. Discussing patient identified problems/goals with staff: Yes. Medical problems stabilized or resolved: Yes. Denies suicidal/homicidal ideation: Yes. Issues/concerns per patient self-inventory: No. Other: None   New problem(s) identified: No, Describe:  None identified   New Short Term/Long Term Goal(s): elimination of symptoms of psychosis, medication management for mood stabilization; elimination of SI thoughts; development of comprehensive mental wellness plan.   Patient Goals:  I want to feel better  Discharge Plan or Barriers: CSW will assist with appropriate discharge planning   Reason for Continuation of Hospitalization: Depression Medication stabilization  Estimated Length of Stay: 1 to 7 days   Last 3 [place] Suicide Severity Risk Score: Flowsheet Row Admission (Current) from 07/14/2024 in [facility]AL MEDICINE ED from 07/13/2024 in [facility] Emergency Department at [facility] ED from 07/10/2024 in [facility] Emergency Department at [facility]  C-SSRS RISK CATEGORY No Risk No Risk No Risk    Last PHQ 2/9 Scores:    05/29/2024   10:26 AM 09/07/2022  11:23 AM 06/21/2021    3:28 PM  Depression screen PHQ 2/9  Decreased Interest 1 1 0  Down, Depressed, Hopeless 1 1 0  PHQ - 2 Score 2 2 0  Altered sleeping 1 1 0  Tired, decreased energy 1 1 0  Change in appetite 1 0 0  Feeling bad or failure about yourself  1 1 0  Trouble concentrating 0 1 0  Moving slowly or fidgety/restless 1 1 0  Suicidal thoughts  0 0 0  PHQ-9 Score 7 7 0  Difficult doing work/chores Somewhat difficult Somewhat difficult     Scribe for Treatment Team: Lum JONETTA Croft, ISRAEL 07/15/2024 10:55 AM

## 2024-07-15 NOTE — H&P (Signed)
 Psychiatric Admission Assessment Adult  Patient Identification: Alyssa [name] MRN:  [id number] Date of Evaluation:  07/15/2024 Chief Complaint:  MDD (major depressive disorder) [F32.9]   History of Present Illness: Alyssa [name] is a 73 year old female that was brought to the emergency room by her husband on 07/13/24 for an evaluation following a syncopal episode that caused her to fall down and hit her head. She was alert after the fall and was able to get herself back up. She also was brought in by her husband for worsening insomnia and anxiety to the point where she is standing all of the time and decreased eating and drinking. She has a history of psychiatric diagnoses of MDD, GAD, psychosis, mood disorder and a past medical history of multifocal papillary thyroid  cancer s/p XRT and surg 2006, post-op hypothyroidism,  HTN, L lumbar radiculopathy, SI joint disease, chronic allergic conjunctivitis, claustrophobia, and glaucoma.She was admitted to inpatient geropsychiatry on 07/14/24.   On interview today, She does not understand why she was admitted to the psych unit, however, she has noticed a decrease in appetite and weight loss over the last few months.  Patient was recently discharged from geropsych unit in June 2025.  Patient reports that after few weeks she stopped taking medications and she felt the appointments with PHP program is too intense and did not go for her appointments.  She reports taking her medications intermittently but is unable to rationalize why she was noncompliant with medications.  She eventually informed that she felt she had problems and decision making with the medications.  When provider asked to give some examples regarding poor decision-making she was unable to give any examples.  She also states that her depression and anxiety has worsened over the last few months for unknown reasons, however, she has dealt with it for a while. She denies any specific concerns that is  causing her depression and anxiety. She has noticed changes in her sleep the last few months causing her to wake up multiple times throughout the night due to unknown reasons. She denies difficulty falling asleep. She is retired and enjoys reading and gardening, but has noticed that she doesn't have much interest in doing those activities anymore. She has noticed a decline in her memory and would be willing to plan to see a neurologist moving forward. She denies SI,HI, nightmares/flashbacks, history of sexual/physical abuse, no panic attacks, muscles aches/headaches, grandiose delusions or racing thoughts.  She denies recent or current episodes of mania/hypomania.  Discussed treatment plan with husband :Alyssa [name] [id number] corroborated the above information that patient stopped taking medications especially Klonopin .  He reports that patient already told him at the time of discharge but she will not be taking Klonopin .  She was taking other medications intermittently.  Patient did well for few days and she started declining.  Husband also informs that patient's older brother told her not to take Klonopin  as he told her it impacts decision-making .  Patient reports that patient is stuck with that statement and stopped taking the medications.  He is aware that she was not even going for her appointments.  Discussed the cognitive impairment as a possibility of benzo withdrawal but also early signs of cognitive impairment that needs to be followed up through neurology by neurocognitive assessment.  Has been is very persistent that patient does perfectly fine when she takes her Klonopin .  But he is aware that we cannot enforce Klonopin  on a patient who is not willing to take.  Total Time spent with patient: 20 minutes Sleep  Sleep:Sleep: Poor  Past Psychiatric History: Diagnosed with GAD and MDD in the past, unknown date.  Psychiatric History:  Information collected from patient.   Prev Dx/Sx:  MDD Current Psych Provider: She used to see Dr. Rosina Rake but has not been in a few months.  Home Meds (current): Klonopin  0.5mg  BID, Lexapro , Ativan , Seroquel , and trazodone   Previous Med Trials: Unknown.  Therapy: Unknown.   Prior Psych Hospitalization: Yes. Last visit was on May 15, 2024.   Prior Self Harm: Denies.  Prior Violence: Denies.  Family Psych History: Brother has history of mental health problems, unknown of diagnosis.  Family Hx suicide: Denies.  Social History:  Educational Hx: Masters Degree in [employer]  Occupational Hx: Unknown.  Legal Hx: Denies.  Living Situation: She lives at home with her husband.  Spiritual Hx: Unknown.  Access to weapons/lethal means: Denies.    Substance History Alcohol: Drinks 1 to 2 beers a week.  Type of alcohol: Beer Last Drink: Last week.  Number of drinks per week: 1-2 beers History of alcohol withdrawal seizures No.  History of DT's: Denies.  Tobacco: Denies.  Illicit drugs: Denies.  Prescription drug abuse: Denies. Rehab hx: Denies.  Is the patient at risk to self? No.  Has the patient been a risk to self in the past 6 months? No.  Has the patient been a risk to self within the distant past? No.  Is the patient a risk to others? No.  Has the patient been a risk to others in the past 6 months? No.  Has the patient been a risk to others within the distant past? No.   [place] Scale:  Flowsheet Row Admission (Current) from 07/14/2024 in [facility] BEHAVIORAL MEDICINE ED from 07/13/2024 in [facility] Emergency Department at [facility] ED from 07/10/2024 in [facility] Emergency Department at [facility]  C-SSRS RISK CATEGORY No Risk No Risk No Risk     Past Medical History:  Past Medical History:  Diagnosis Date   Anxiety and depression    Arthritis    Cervicogenic headache    Claustrophobia    Glaucoma    Hypertension    Lumbar radiculopathy    Postsurgical hypothyroidism    Spinal stenosis     Thyroid  cancer [facility])    s/p thyroidectomy    Past Surgical History:  Procedure Laterality Date   FOOT SURGERY     LAMINOTOMY  04/03/2021   LEFT L3/4 LAMINOTOMY BILATERAL L3/4 MEDIAL FACETECTOMY   LUMBAR LAMINECTOMY  03/03/2018   THYROIDECTOMY  12/2004   Family History:  Family History  Problem Relation Age of Onset   Hypertension Sister    Thyroid  cancer Brother    Skin cancer Brother    Diabetes Paternal Grandmother    Colon cancer Neg Hx    Breast cancer Neg Hx    Pancreatic cancer Neg Hx    Stomach cancer Neg Hx    Liver cancer Neg Hx    Esophageal cancer Neg Hx     Social History:  Social History   Substance and Sexual Activity  Alcohol Use Yes   Comment: Rare     Social History   Substance and Sexual Activity  Drug Use Never      Allergies:   Allergies  Allergen Reactions   Amoxicillin Hives   Clavulanic Acid Other (See Comments)    Family is unaware of this    Lab Results: No  results found for this or any previous visit (from the past 48 hours).  Blood Alcohol level:  Lab Results  Component Value Date   [facility] <15 07/13/2024   ETH <15 05/14/2024    Metabolic Disorder Labs:  Lab Results  Component Value Date   HGBA1C 5.1 06/07/2022   MPG 100 06/07/2022   No results found for: PROLACTIN Lab Results  Component Value Date   CHOL 309 (H) 06/07/2022   TRIG 62 06/07/2022   HDL 75 06/07/2022   CHOLHDL 4.1 06/07/2022   VLDL 12 06/07/2022   LDLCALC 222 (H) 06/07/2022   LDLCALC 125 (H) 11/27/2021    Current Medications: Current Facility-Administered Medications  Medication Dose Route Frequency Provider Last Rate Last Admin   acetaminophen  (TYLENOL ) tablet 650 mg  650 mg Oral Q6H PRN Motley-Mangrum, Jadeka A, PMHNP       alum & mag hydroxide-simeth (MAALOX/MYLANTA) 200-200-20 MG/5ML suspension 30 mL  30 mL Oral Q4H PRN Motley-Mangrum, Jadeka A, PMHNP       haloperidol  (HALDOL ) tablet 5 mg  5 mg Oral TID PRN Motley-Mangrum, Jadeka A, PMHNP        And   diphenhydrAMINE  (BENADRYL ) capsule 50 mg  50 mg Oral TID PRN Motley-Mangrum, Jadeka A, PMHNP       haloperidol  lactate (HALDOL ) injection 5 mg  5 mg Intramuscular TID PRN Motley-Mangrum, Jadeka A, PMHNP       And   diphenhydrAMINE  (BENADRYL ) injection 50 mg  50 mg Intramuscular TID PRN Motley-Mangrum, Jadeka A, PMHNP       And   LORazepam  (ATIVAN ) injection 2 mg  2 mg Intramuscular TID PRN Motley-Mangrum, Jadeka A, PMHNP       haloperidol  lactate (HALDOL ) injection 10 mg  10 mg Intramuscular TID PRN Motley-Mangrum, Jadeka A, PMHNP       And   diphenhydrAMINE  (BENADRYL ) injection 50 mg  50 mg Intramuscular TID PRN Motley-Mangrum, Jadeka A, PMHNP       And   LORazepam  (ATIVAN ) injection 2 mg  2 mg Intramuscular TID PRN Motley-Mangrum, Jadeka A, PMHNP       hydrOXYzine  (ATARAX ) tablet 25 mg  25 mg Oral TID PRN Motley-Mangrum, Jadeka A, PMHNP       levothyroxine  (SYNTHROID ) tablet 137 mcg  137 mcg Oral Q0600 Motley-Mangrum, Jadeka A, PMHNP   137 mcg at 07/15/24 0645   magnesium  hydroxide (MILK OF MAGNESIA) suspension 30 mL  30 mL Oral Daily PRN Motley-Mangrum, Jadeka A, PMHNP       OLANZapine  zydis (ZYPREXA ) disintegrating tablet 2.5 mg  2.5 mg Oral Q8H PRN Motley-Mangrum, Jadeka A, PMHNP       traZODone  (DESYREL ) tablet 50 mg  50 mg Oral QHS PRN Motley-Mangrum, Jadeka A, PMHNP   50 mg at 07/14/24 2201   PTA Medications: Medications Prior to Admission  Medication Sig Dispense Refill Last Dose/Taking   clonazePAM  (KLONOPIN ) 0.5 MG disintegrating tablet Take 1 tablet (0.5 mg total) by mouth 2 (two) times daily. (Patient not taking: Reported on 07/13/2024) 60 tablet 0    levothyroxine  (SYNTHROID ) 137 MCG tablet Take 1 tablet (137 mcg total) by mouth daily at 6 (six) AM. 30 tablet 0    sertraline  (ZOLOFT ) 25 MG tablet Take 1 tablet (25 mg total) by mouth daily. (Patient not taking: Reported on 07/13/2024) 30 tablet 0    Travoprost, BAK Free, (TRAVATAN) 0.004 % SOLN ophthalmic solution Place 1  drop into both eyes at bedtime.       Psychiatric Specialty Exam:  Presentation  General Appearance:  Appropriate for Environment; Casual  Eye Contact: Fair  Speech: Slow  Speech Volume: Normal    Mood and Affect  Mood: Anxious; Depressed  Affect: Depressed; Flat   Thought Process  Thought Processes: Irrevelant  Descriptions of Associations:Intact  Orientation:Partial  Thought Content:Illogical  Hallucinations:Hallucinations: None  Ideas of Reference:Paranoia  Suicidal Thoughts: Denies Homicidal Thoughts:Homicidal Thoughts: No   Sensorium  Memory: Immediate Fair; Recent Fair; Remote Poor  Judgment: Impaired  Insight: Shallow   Executive Functions  Concentration: Poor  Attention Span: Poor  Recall: Poor  Fund of Knowledge: Fair  Language: Fair   Psychomotor Activity  Psychomotor Activity: Psychomotor Activity: Normal   Assets  Assets: Communication Skills; Desire for Improvement; Resilience; Social Support    Musculoskeletal: Strength & Muscle Tone: within normal limits Gait & Station: normal  Physical Exam: Physical Exam Vitals and nursing note reviewed.  HENT:     Head: Normocephalic.     Nose: Nose normal.  Cardiovascular:     Rate and Rhythm: Normal rate.  Pulmonary:     Effort: Pulmonary effort is normal.  Neurological:     Mental Status: She is alert.    Review of Systems  Constitutional: Negative.   HENT: Negative.    Eyes: Negative.   Cardiovascular: Negative.   Skin: Negative.    Blood pressure 125/65, pulse 64, temperature 97.7 F (36.5 C), resp. rate 16, height 5' 8 (1.727 m), weight 66 kg, SpO2 100%. Body mass index is 22.12 kg/m.  Principal Diagnosis: MDD (major depressive disorder) Diagnosis:  Principal Problem:   MDD (major depressive disorder)   Clinical Decision Making: Patient with history of depression and anxiety on chronic prescribed benzo with recent hospitalization in the  geropsych unit, admitted for similar presentation like her previous hospitalization for anxiety, cognitive impairment, mental clouding, bizarre behaviors of standing for hours, pacing, not sleeping.  Patient has stopped all her medications and has not followed up with any outpatient psychiatry.  Patient needs close monitoring and medication management during this inpatient hospitalization.  Treatment Plan Summary:  Safety and Monitoring:             -- Voluntary admission to inpatient psychiatric unit for safety, stabilization and treatment             -- Daily contact with patient to assess and evaluate symptoms and progress in treatment             -- Patient's case to be discussed in multi-disciplinary team meeting             -- Observation Level: q15 minute checks             -- Vital signs:  q12 hours             -- Precautions: suicide, elopement, and assault   2. Psychiatric Diagnoses and Treatment:               Patient is not interested in medication management.  Will discuss in detail about benzos that she has been prescribed chronically and SSRIs that she already tried Lexapro  and Zoloft .   -- The risks/benefits/side-effects/alternatives to this medication were discussed in detail with the patient and time was given for questions. The patient consents to medication trial.                -- Metabolic profile and EKG monitoring obtained while on an atypical antipsychotic (BMI: Lipid Panel: HbgA1c: QTc:)              --  Encouraged patient to participate in unit milieu and in scheduled group therapies                            3. Medical Issues Being Addressed:      4. Discharge Planning:              -- Social work and case management to assist with discharge planning and identification of hospital follow-up needs prior to discharge             -- Estimated LOS: 5-7 days             -- Discharge Concerns: Need to establish a safety plan; Medication compliance and effectiveness              -- Discharge Goals: Return home with outpatient referrals follow ups  -- Will plan to schedule an appointment with Neurology to evaluate for dementia.   Physician Treatment Plan for Primary Diagnosis: MDD (major depressive disorder) Long Term Goal(s): Improvement in symptoms so as ready for discharge  Short Term Goals: Ability to identify changes in lifestyle to reduce recurrence of condition will improve, Ability to verbalize feelings will improve, Ability to disclose and discuss suicidal ideas, Ability to demonstrate self-control will improve, and Ability to identify and develop effective coping behaviors will improve  Physician Treatment Plan for Secondary Diagnosis: Principal Problem:   MDD (major depressive disorder)  Long Term Goal(s): Improvement in symptoms so as ready for discharge  Short Term Goals: Ability to identify changes in lifestyle to reduce recurrence of condition will improve, Ability to verbalize feelings will improve, Ability to disclose and discuss suicidal ideas, Ability to demonstrate self-control will improve, and Ability to identify and develop effective coping behaviors will improve  I certify that inpatient services furnished can reasonably be expected to improve the patient's condition.    Darryle Crouch, Student-PA 8/6/20252:05 PM

## 2024-07-15 NOTE — Group Note (Signed)
 Physical/Occupational Therapy Group Note  Group Topic: Yoga  Group Date: 07/15/2024 Start Time: 1300 End Time: 1330 Facilitators: Kairi Harshbarger, Alm Hamilton, PT   Group Description: Group participated with series of yoga poses, designed to emphasize functional sitting balance, core stability, generalized flexibility and overall posture.  Incorporated deep breathing techniques with poses, working to promote relaxation, mindfulness and focus with targeted activities.   Discussed benefits of yoga in improving mood and self-esteem, reducing stress and anxiety, and promoting functional strength and balance for each participant.  Discussed ways to integrate into each participant's daily routine.  Provided handout with written and pictorial descriptions of included yoga movements to be utilized as appropriate outside of group time.  Therapeutic Goal(s):  Demonstrate safe ability to participate with yoga poses during group activity. Identify one benefit of participation with yoga poses as part of each participant's exercise/movement routine. Identify 1-2 individual poses that participant feels most beneficial to his/her needs and that he/she can easily replicate outside of group.  Individual Participation: Pt actively participated with the activity components of the session but only minimally with the discussion components.  Participation Level: Active and Engaged   Participation Quality: Minimal Cues   Behavior: Attentive  and Calm   Speech/Thought Process: Minimal verbal communication during the session   Affect/Mood: Flat   Insight: Moderate   Judgement: Moderate   Modes of Intervention: Activity, Discussion, and Education  Patient Response to Interventions:  Attentive and Interested    Plan: Continue to engage patient in PT/OT groups 1 - 2x/week.  CHARM Hamilton Bertin PT, DPT 07/15/24, 1:45 PM

## 2024-07-15 NOTE — Group Note (Signed)
 Date:  07/15/2024 Time:  4:00 PM  Group Topic/Focus:  Self Care:   The focus of this group is to help patients understand the importance of self-care in order to improve or restore emotional, physical, spiritual, interpersonal, and financial health.    Participation Level:  Active  Participation Quality:  Appropriate  Affect:  Appropriate  Cognitive:  Appropriate  Insight: Good  Engagement in Group:  Engaged  Modes of Intervention:  Activity  Additional Comments:  N/A  Harlene LITTIE Gavel 07/15/2024, 4:00 PM

## 2024-07-15 NOTE — Progress Notes (Signed)
   07/15/24 1400  Psych Admission Type (Psych Patients Only)  Admission Status Involuntary  Psychosocial Assessment  Patient Complaints Depression  Eye Contact Fair  Facial Expression Anxious;Sad  Affect Flat  Speech Soft  Interaction Minimal  Motor Activity Slow  Appearance/Hygiene In scrubs  Behavior Characteristics Cooperative  Mood Depressed  Thought Process  Coherency Disorganized  Content WDL  Delusions None reported or observed  Perception WDL  Hallucination None reported or observed  Judgment Impaired  Confusion Mild  Danger to Self  Current suicidal ideation? Denies  Danger to Others  Danger to Others None reported or observed

## 2024-07-15 NOTE — [facility] Suicide Risk Assessment (Signed)
 [facility] Admission Suicide Risk Assessment   Nursing information obtained from:  Patient Demographic factors:  Age 73 or older, Caucasian Current Mental Status:  NA Loss Factors:  NA Historical Factors:  NA Risk Reduction Factors:  Living with another person, especially a relative, Positive social support  Total Time spent with patient: 30 minutes Principal Problem: MDD (major depressive disorder) Diagnosis:  Principal Problem:   MDD (major depressive disorder)  Subjective Data: Alyssa [name] is a 73 y.o. female admitted: Presented to the ED on 07/13/2024  2:18 AM for  having episodes non stop hours of pacing, fidgeting with hands, saying words that do not make sense, and paranoia. 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 She meets criteria for inpatient psychiatric admission.Patient is admitted to [facility] unit with Q15 min safety monitoring. Multidisciplinary team approach is offered. Medication management; group/milieu therapy is offered.   Continued Clinical Symptoms:  Alcohol Use Disorder Identification Test Final Score (AUDIT): 0 The Alcohol Use Disorders Identification Test, Guidelines for Use in Primary Care, Second Edition.  [employer] writer [facility]). Score between 0-7:  no or low risk or alcohol related problems. Score between 8-15:  moderate risk of alcohol related problems. Score between 16-19:  high risk of alcohol related problems. Score 20 or above:  warrants further diagnostic evaluation for alcohol dependence and treatment.   CLINICAL FACTORS:    Depression:   Insomnia   Musculoskeletal: Strength & Muscle Tone: within normal limits Gait & Station: normal Patient leans: N/A  Psychiatric Specialty Exam:  Presentation  General Appearance:  Appropriate for Environment; Casual  Eye Contact: Fair  Speech: Slow  Speech Volume: Normal  Handedness: Right   Mood and Affect  Mood: Anxious; Depressed  Affect: Depressed; Flat   Thought Process  Thought Processes: Irrevelant  Descriptions of Associations:Intact  Orientation:Partial  Thought Content:Illogical  History of Schizophrenia/Schizoaffective disorder:No  Duration of Psychotic Symptoms:N/A  Hallucinations:Hallucinations: None  Ideas of Reference:Paranoia  Suicidal Thoughts:No data recorded Homicidal Thoughts:Homicidal Thoughts: No   Sensorium  Memory: Immediate Fair; Recent Fair; Remote Poor  Judgment: Impaired  Insight: Shallow   Executive Functions  Concentration: Poor  Attention Span: Poor  Recall: Poor  Fund of Knowledge: Fair  Language: Fair   Psychomotor Activity  Psychomotor Activity: Psychomotor Activity: Normal   Assets  Assets: Communication Skills; Desire for Improvement; Resilience; Social Support   Sleep  Sleep: Sleep: Poor    Physical Exam: Physical Exam ROS Blood pressure 125/65, pulse 64, temperature 97.7 F (36.5 C), resp. rate 16, height 5' 8 (1.727 m), weight 66 kg, SpO2 100%. Body mass index is 22.12 kg/m.   COGNITIVE FEATURES THAT CONTRIBUTE TO RISK:  None    SUICIDE RISK:   Minimal: No identifiable suicidal ideation.  Patients presenting with no risk factors but with morbid ruminations; may be classified as minimal risk based on the severity of the depressive symptoms  PLAN OF CARE: Patient is admitted to [facility] psych unit with Q15 min safety monitoring. Multidisciplinary team approach is offered. Medication management; group/milieu therapy is offered.   I certify that inpatient  services furnished can reasonably be expected to improve the patient's condition.   Janna Oak, MD  07/15/2024, 1:14 PM

## 2024-07-15 NOTE — [facility] Counselor (Signed)
 CSW and provider contacted pt's spouse, Lou Irigoyen, [phone].   Provider inquired about events that brought pt back to the hospital and why pt did not follow-up with outpatient when she left the hospital last time.   John reports that pt ended up without a medical provider, reports that was pt's  choice. He also reports pt stopped taking her medication because of concerns about Clonazepam . Reports that pt took at most 1/2 pill of medication per day. Reports that pt stopped taking Synthroid , reduced eating and fluid intake.   Reports that pt was frozen in place, standing for long periods, and reports on the last night pt was standing by the bed and was not getting into the bed. Reports pt finally collapsed and it woke him up and he got her off the floor and took her to emergency room.   Provider reports to pt's husband that she is concerned about early-onset dementia given pt's presentation. Provider suggest pt follow-up on an outpatient basis with neurology.   [name] states he is unsure about dementia. Provider suggest that there is changes to pt's cognitive functioning and her ability to make appropriate decisions, reporting that pt is concrete and has difficulty processing.   John reports pt is planning to follow-up with PCP about her thyroid , and that her PCP has made it clear to pt that he will not prescribe pt's psychotropics.   CSW affirmed that pt would be leaving the hospital with scheduled follow-up similar to last time, but at a typical psychiatry office as pt reported that she felt PHP was too overwhelming.   John verbalized understanding    Lum Croft, MSW, [facility] 07/15/2024 3:40 PM

## 2024-07-15 NOTE — Plan of Care (Signed)
  Problem: Education: Goal: Ability to state activities that reduce stress will improve Outcome: Not Progressing   Problem: Coping: Goal: Ability to identify and develop effective coping behavior will improve Outcome: Not Progressing   Problem: Self-Concept: Goal: Ability to identify factors that promote anxiety will improve Outcome: Not Progressing Goal: Level of anxiety will decrease Outcome: Not Progressing Goal: Ability to modify response to factors that promote anxiety will improve Outcome: Not Progressing   Problem: Education: Goal: Utilization of techniques to improve thought processes will improve Outcome: Not Progressing Goal: Knowledge of the prescribed therapeutic regimen will improve Outcome: Not Progressing   Problem: Activity: Goal: Interest or engagement in leisure activities will improve Outcome: Not Progressing Goal: Imbalance in normal sleep/wake cycle will improve Outcome: Not Progressing   Problem: Coping: Goal: Coping ability will improve Outcome: Not Progressing Goal: Will verbalize feelings Outcome: Not Progressing   Problem: Health Behavior/Discharge Planning: Goal: Ability to make decisions will improve Outcome: Not Progressing Goal: Compliance with therapeutic regimen will improve Outcome: Not Progressing   Problem: Role Relationship: Goal: Will demonstrate positive changes in social behaviors and relationships Outcome: Not Progressing   Problem: Safety: Goal: Ability to disclose and discuss suicidal ideas will improve Outcome: Not Progressing Goal: Ability to identify and utilize support systems that promote safety will improve Outcome: Not Progressing   Problem: Self-Concept: Goal: Will verbalize positive feelings about self Outcome: Not Progressing Goal: Level of anxiety will decrease Outcome: Not Progressing   Problem: Education: Goal: Ability to make informed decisions regarding treatment will improve Outcome: Not Progressing    Problem: Coping: Goal: Coping ability will improve Outcome: Not Progressing   Problem: Health Behavior/Discharge Planning: Goal: Identification of resources available to assist in meeting health care needs will improve Outcome: Not Progressing   Problem: Medication: Goal: Compliance with prescribed medication regimen will improve Outcome: Not Progressing   Problem: Self-Concept: Goal: Ability to disclose and discuss suicidal ideas will improve Outcome: Not Progressing Goal: Will verbalize positive feelings about self Outcome: Not Progressing Note: Patient is not on track and no change. Patient will maintain adherence and adhere to provider and/or lab appointments

## 2024-07-15 NOTE — Progress Notes (Signed)
   07/15/24 2200  Psych Admission Type (Psych Patients Only)  Admission Status Involuntary  Psychosocial Assessment  Patient Complaints Confusion;Depression;Worrying  Eye Contact Fair  Facial Expression Anxious;Worried  Affect Flat  Speech Soft  Interaction Cautious  Motor Activity Slow  Appearance/Hygiene In scrubs  Behavior Characteristics Cooperative  Mood Depressed;Anxious  Thought Process  Coherency Disorganized  Content WDL  Delusions None reported or observed  Perception WDL  Hallucination None reported or observed  Judgment Impaired  Confusion Mild  Danger to Self  Current suicidal ideation? Denies  Danger to Others  Danger to Others None reported or observed

## 2024-07-15 NOTE — Progress Notes (Signed)
 Estimated Sleeping Duration (Last 24 Hours): 4.75-5.75 hours   (Sleep Hours) - 4.75 - 5.75 hrs (Any PRNs that were needed, meds refused, or side effects to meds)- Received PRN trazodone  50mg  at HS (Any disturbances and when (visitation, over night)- restless, easily awaken during routine rounding.  (Concerns raised by the patient)- none (SI/HI/AVH)- denied all   07/15/24 0400  Psych Admission Type (Psych Patients Only)  Admission Status Involuntary  Psychosocial Assessment  Eye Contact Watchful  Facial Expression Anxious  Affect Flat  Speech Soft  Interaction Minimal  Motor Activity Slow  Appearance/Hygiene In scrubs  Behavior Characteristics Cooperative;Calm  Mood Anxious;Depressed;Suspicious  Thought Process  Coherency Disorganized  Content WDL  Delusions None reported or observed  Perception WDL  Hallucination None reported or observed  Judgment Impaired  Confusion Moderate  Danger to Self  Current suicidal ideation? Denies  Danger to Others  Danger to Others None reported or observed

## 2024-07-16 DIAGNOSIS — F321 Major depressive disorder, single episode, moderate: Secondary | ICD-10-CM | POA: Diagnosis not present

## 2024-07-16 DIAGNOSIS — F411 Generalized anxiety disorder: Secondary | ICD-10-CM | POA: Diagnosis not present

## 2024-07-16 MED ORDER — DIPHENHYDRAMINE HCL 50 MG/ML IJ SOLN: 50.0000 mg | Freq: Three times a day (TID) | INTRAMUSCULAR | Status: DC | PRN

## 2024-07-16 MED ORDER — LORAZEPAM 1 MG PO TABS: 2.0000 mg | ORAL_TABLET | Freq: Three times a day (TID) | ORAL | Status: DC | PRN

## 2024-07-16 MED ORDER — HALOPERIDOL LACTATE 5 MG/ML IJ SOLN: 10.0000 mg | Freq: Three times a day (TID) | INTRAMUSCULAR | Status: DC | PRN

## 2024-07-16 MED ORDER — HALOPERIDOL LACTATE 5 MG/ML IJ SOLN: 5.0000 mg | Freq: Three times a day (TID) | INTRAMUSCULAR | Status: DC | PRN

## 2024-07-16 MED ORDER — SERTRALINE HCL 25 MG PO TABS
25.0000 mg | ORAL_TABLET | Freq: Every day | ORAL | Status: DC
  Administered 2024-07-16 – 2024-07-17 (×2): 25 mg via ORAL
  Filled 2024-07-16 (×2): qty 1

## 2024-07-16 MED ORDER — CLONAZEPAM 0.25 MG PO TBDP
0.2500 mg | ORAL_TABLET | Freq: Every day | ORAL | Status: DC
  Administered 2024-07-16 – 2024-07-17 (×2): 0.25 mg via ORAL
  Filled 2024-07-16 (×2): qty 1

## 2024-07-16 NOTE — Plan of Care (Signed)
  Problem: Activity: Goal: Interest or engagement in leisure activities will improve Outcome: Progressing   Problem: Self-Concept: Goal: Ability to identify factors that promote anxiety will improve Outcome: Not Progressing Goal: Level of anxiety will decrease Outcome: Not Progressing

## 2024-07-16 NOTE — Group Note (Signed)
 Date:  07/16/2024 Time:  4:02 AM  Group Topic/Focus:  Wrap-Up Group:   The focus of this group is to help patients review their daily goal of treatment and discuss progress on daily workbooks.    Participation Level:  Did Not Attend  Participation Quality:     Affect:     Cognitive:     Insight: None  Engagement in Group:  None  Modes of Intervention:     Additional Comments:    Tommas CHRISTELLA Bunker 07/16/2024, 4:02 AM

## 2024-07-16 NOTE — Group Note (Signed)
 Date:  07/16/2024 Time:  11:20 AM  Group Topic/Focus:  Dimensions of Wellness:   The focus of this group is to introduce the topic of wellness and discuss the role each dimension of wellness plays in total health.    Participation Level:  Active  Participation Quality:  Appropriate  Affect:  Appropriate  Cognitive:  Appropriate  Insight: Good  Engagement in Group:  Engaged  Modes of Intervention:  Activity  Additional Comments:  N/A Harlene LITTIE Gavel 07/16/2024, 11:20 AM

## 2024-07-16 NOTE — Plan of Care (Signed)
  Problem: Self-Concept: Goal: Ability to identify factors that promote anxiety will improve Outcome: Not Progressing Goal: Level of anxiety will decrease Outcome: Not Progressing   Problem: Activity: Goal: Interest or engagement in leisure activities will improve Outcome: Not Progressing

## 2024-07-16 NOTE — Group Note (Signed)
 LCSW Group Therapy Note  Group Date: 07/16/2024 Start Time: 1315 End Time: 1400   Type of Therapy and Topic:  Group Therapy: Positive Affirmations  Participation Level:  None   Description of Group:   This group addressed positive affirmation towards self and others.  Patients went around the room and identified two positive things about themselves and two positive things about a peer in the room.  Patients reflected on how it felt to share something positive with others, to identify positive things about themselves, and to hear positive things from others/ Patients were encouraged to have a daily reflection of positive characteristics or circumstances.   Therapeutic Goals: Patients will verbalize two of their positive qualities Patients will demonstrate empathy for others by stating two positive qualities about a peer in the group Patients will verbalize their feelings when voicing positive self affirmations and when voicing positive affirmations of others Patients will discuss the potential positive impact on their wellness/recovery of focusing on positive traits of self and others.  Summary of Patient Progress:  Alyssa [name] did not actively engaged in the discussion and . She was  not able to identify positive affirmations about herself as well as other group members. Patient demonstrated no insight into the subject matter, was respectful of peers, participated throughout the entire session.  Therapeutic Modalities:   Cognitive Behavioral Therapy Motivational Interviewing    [name], [place] 07/16/2024  2:12 PM

## 2024-07-16 NOTE — Progress Notes (Signed)
(  Sleep Hours) - 4 (Any PRNs that were needed, meds refused, or side effects to meds)- no prns, no meds refused, no side effects to meds  (Any disturbances and when (visitation, over night)- N/A (Concerns raised by the patient)- worried and confused  (SI/HI/AVH)- DENIES

## 2024-07-16 NOTE — Group Note (Signed)
 Recreation Therapy Group Note   Group Topic:Stress Management  Group Date: 07/16/2024 Start Time: 1500 End Time: 1600 Facilitators: Celestia Jeoffrey BRAVO, LRT, CTRS Location: Courtyard  Group Description: Outdoor Recreation. Patients had the option to play corn hole, ring toss, bowling or listening to music while outside in the courtyard getting fresh air and sunlight. Patients helped water  and prune the raised garden beds. LRT and patients discussed things that they enjoy doing in their free time outside of the hospital. LRT encouraged patients to drink water  after being active and getting their heart rate up.   Goal Area(s) Addressed: Patient will identify leisure interests.  Patient will practice healthy decision making. Patient will engage in recreation activity   Affect/Mood: Anxious   Participation Level: Moderate   Participation Quality: Moderate Cues   Behavior: Cooperative   Speech/Thought Process: Coherent   Insight: Limited   Judgement: Limited   Modes of Intervention: Music, Rapport Building, and Socialization   Patient Response to Interventions:  Receptive   Education Outcome:  In group clarification offered    Clinical Observations/Individualized Feedback: Ravan was active in their participation of session activities and group discussion. Pt was noted to be very anxious and hesitant to come outside to the courtyard. After much encouragement, pt joined. Pt was observed reading the bible with peers.    Plan: Continue to engage patient in RT group sessions 2-3x/week.   Jeoffrey BRAVO Celestia, LRT, CTRS 07/16/2024 5:16 PM

## 2024-07-16 NOTE — Progress Notes (Signed)
   07/16/24 1300  Spiritual Encounters  Type of Visit Initial  Care provided to: Patient  Referral source Chaplain assessment  Reason for visit Routine spiritual support  OnCall Visit No   After initially meeting [name] in dayroom, she elected to visit one-on-one.  Alyssa [name] shared that the past three years in [place] since relocating from [place] things had gotten worse and worse. Specifically, she mentioned decline in relationship with her sister who, besides her spouse seems to be other main relationship. Unclear what motivated the move. Stated that sister could be controlling. She shared that she is Alyssa [name] and hopes to strengthen her faith.  I provided compassionate, non-anxious presence. When I asked about loss Alyssa [name] became tearful and that is what prompted sharing about move. I invited reflection around loss and naming what she feels is missing from her life in [place] (loss of friends, self, place, activities, faith community). I invited reflection about ways to form new connections here that might restore those elements. Also discussed boundaries briefly and encouraged communication and boundary setting with sister. I offered prayer with Alyssa [name]'s consent.  Steele Stracener L. Fredrica, M.Div [phone]

## 2024-07-16 NOTE — Progress Notes (Signed)
 [facility] MD Progress Note  07/16/2024 12:32 PM Alyssa [name]  MRN:  [id number] Alyssa [name] is a 73 y.o. female admitted: Presented to the ED on 05/14/2024  1:26 PM for  having episodes non stop hours of pacing, fidgeting with hands, saying words that do not make sense, and paranoia. 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 She meets criteria for inpatient psychiatric admission.Patient is admitted to [facility] unit with Q15 min safety monitoring. Multidisciplinary team approach is offered. Medication management; group/milieu therapy is offered.   Subjective:  Chart reviewed, case discussed in multidisciplinary meeting, patient seen during rounds.  Patient was interviewed in the presence of the Child psychotherapist.  Provider discussed extensively her opinion on medications as yesterday's discussion with the husband made it clear that patient does not want to take her Klonopin  or any psychotropic medications after discharge and declines drastically with worsening confusion and anxiety.  Per nursing staff patient is noted to be more confused today, poor personal boundaries, repeating herself, not able to follow the instructions like sit in the chair or walk outside.  She denies SI/HI/plan.  She does acknowledge cognitive impairment.  She eventually agreed to take Klonopin  and Zoloft  for her anxiety.  Provider educated her extensively about benzo withdrawal cognitive impairment or delaying.  Patient is aware and educated about adding Klonopin  small dose at night to help with the benzo withdrawal.  She denies  auditory/visual hallucinations.  We also discussed about scheduling her regular mental health clinic that can provide both individual and remote access to her appointments   Sleep: Fair  Appetite:  Fair  Past Psychiatric History: see h&P Family History:  Family History  Problem Relation Age of Onset   Hypertension Sister    Thyroid  cancer Brother    Skin cancer Brother    Diabetes Paternal Grandmother    Colon cancer Neg Hx    Breast cancer Neg Hx    Pancreatic cancer Neg Hx    Stomach cancer Neg Hx    Liver cancer Neg Hx    Esophageal cancer Neg Hx    Social History:  Social History   Substance and Sexual Activity  Alcohol Use Yes   Comment: Rare     Social History   Substance and Sexual Activity  Drug Use Never    Social History   Socioeconomic History   Marital status: Married    Spouse name: Not on file   Number of children: 0   Years of education: Not on file   Highest education level: Master's degree (e.g., MA, MS, MEng, MEd, MSW, MBA)  Occupational History   Occupation: retired, Chief Financial Officer  Tobacco Use   Smoking status: Never   Smokeless tobacco: Never  Vaping Use   Vaping status: Never Used  Substance and Sexual Activity   Alcohol use: Yes    Comment: Rare   Drug use: Never   Sexual activity: Not Currently  Other Topics Concern   Not on file  Social History Narrative   Moved from [place] June 2022   Lives w/ husband   Moved to stay close to her sister (Mrs Andra)   Social Drivers of Health  Financial Resource Strain: Low Risk  (01/27/2023)   Received from [facility]   Overall Financial Resource Strain (CARDIA)    Difficulty of Paying Living Expenses: Not hard at all  Food Insecurity: Patient Declined (07/14/2024)   Hunger Vital Sign    Worried About Running Out of Food in the Last Year: Patient declined    Ran Out of Food in the Last Year: Patient declined  Transportation Needs: Patient Declined (07/14/2024)   PRAPARE - Therapist, art (Medical): Patient declined    Lack of Transportation (Non-Medical): Patient declined  Physical Activity: Sufficiently Active (01/27/2023)   Received [facility]   Exercise Vital Sign    On average, how many days per week do you engage in moderate to strenuous exercise (like a brisk walk)?: 5 days    On average, how many minutes do you engage in exercise at this level?: 30 min  Stress: No Stress Concern Present (01/27/2023)   Received from [facility] - Occupational Stress Questionnaire    Feeling of Stress : Only a little  Social Connections: Moderately Isolated (07/14/2024)   Social Connection and Isolation Panel    Frequency of Communication with Friends and Family: Three times a week    Frequency of Social Gatherings with Friends and Family: Once a week    Attends Religious Services: Never    Database administrator or Organizations: No    Attends Engineer, structural: Never    Marital Status: Married   Past Medical History:  Past Medical History:  Diagnosis Date   Anxiety and depression    Arthritis    Cervicogenic headache    Claustrophobia    Glaucoma    Hypertension    Lumbar radiculopathy    Postsurgical hypothyroidism    Spinal stenosis    Thyroid  cancer (HCC)    s/p thyroidectomy    Past Surgical History:  Procedure Laterality Date   FOOT SURGERY     LAMINOTOMY  04/03/2021   LEFT L3/4 LAMINOTOMY BILATERAL L3/4 MEDIAL FACETECTOMY   LUMBAR LAMINECTOMY  03/03/2018   THYROIDECTOMY  12/2004    Current Medications: Current Facility-Administered Medications  Medication Dose Route Frequency Provider Last Rate Last Admin   acetaminophen  (TYLENOL ) tablet 650 mg  650 mg Oral Q6H PRN Motley-Mangrum, Jadeka A, PMHNP       alum & mag hydroxide-simeth (MAALOX/MYLANTA) 200-200-20 MG/5ML suspension 30 mL  30 mL Oral Q4H PRN Motley-Mangrum, Jadeka A, PMHNP       haloperidol  (HALDOL ) tablet 5  mg  5 mg Oral TID PRN Motley-Mangrum, Jadeka A, PMHNP       And   diphenhydrAMINE  (BENADRYL ) capsule 50 mg  50 mg Oral TID PRN Motley-Mangrum, Jadeka A, PMHNP       haloperidol  lactate (HALDOL ) injection 10 mg  10 mg Intramuscular TID PRN Diondre Pulis, MD       And   diphenhydrAMINE  (BENADRYL ) injection 50 mg  50 mg Intramuscular TID PRN Donnelly Mellow, MD       And   LORazepam  (ATIVAN ) tablet 2 mg  2 mg Oral TID PRN Welda Azzarello, MD       haloperidol  lactate (HALDOL ) injection 5 mg  5 mg Intramuscular TID PRN Korrine Sicard, MD       And   diphenhydrAMINE  (BENADRYL ) injection 50 mg  50 mg Intramuscular TID PRN Donnelly Mellow, MD       And   LORazepam  (  ATIVAN ) tablet 2 mg  2 mg Oral TID PRN Debralee Braaksma, MD       hydrOXYzine  (ATARAX ) tablet 25 mg  25 mg Oral TID PRN Motley-Mangrum, Jadeka A, PMHNP       levothyroxine  (SYNTHROID ) tablet 137 mcg  137 mcg Oral Q0600 Motley-Mangrum, Jadeka A, PMHNP   137 mcg at 07/16/24 0553   magnesium  hydroxide (MILK OF MAGNESIA) suspension 30 mL  30 mL Oral Daily PRN Motley-Mangrum, Jadeka A, PMHNP       OLANZapine  zydis (ZYPREXA ) disintegrating tablet 2.5 mg  2.5 mg Oral Q8H PRN Motley-Mangrum, Jadeka A, PMHNP       traZODone  (DESYREL ) tablet 50 mg  50 mg Oral QHS PRN Motley-Mangrum, Jadeka A, PMHNP   50 mg at 07/15/24 2113    Lab Results: No results found for this or any previous visit (from the past 48 hours).  Blood Alcohol level:  Lab Results  Component Value Date   [facility] <15 07/13/2024   ETH <15 05/14/2024    Metabolic Disorder Labs: Lab Results  Component Value Date   HGBA1C 5.1 06/07/2022   MPG 100 06/07/2022   No results found for: PROLACTIN Lab Results  Component Value Date   CHOL 309 (H) 06/07/2022   TRIG 62 06/07/2022   HDL 75 06/07/2022   CHOLHDL 4.1 06/07/2022   VLDL 12 06/07/2022   LDLCALC 222 (H) 06/07/2022   LDLCALC 125 (H) 11/27/2021    Physical Findings: AIMS:  , ,  ,  ,    CIWA:    COWS:       Psychiatric Specialty Exam:  Presentation  General Appearance:  Appropriate for Environment; Casual  Eye Contact: Fair  Speech: Slow  Speech Volume: Normal    Mood and Affect  Mood: Anxious; Depressed  Affect: Depressed; Flat   Thought Process  Thought Processes: Irrevelant  Descriptions of Associations:Intact  Orientation:Partial  Thought Content:Illogical  Hallucinations:Hallucinations: None  Ideas of Reference:Paranoia  Suicidal Thoughts:No data recorded Homicidal Thoughts:Homicidal Thoughts: No   Sensorium  Memory: Immediate Fair; Recent Fair; Remote Poor  Judgment: Impaired  Insight: Shallow   Executive Functions  Concentration: Poor  Attention Span: Poor  Recall: Poor  Fund of Knowledge: Fair  Language: Fair   Psychomotor Activity  Psychomotor Activity: Psychomotor Activity: Normal  Musculoskeletal: Strength & Muscle Tone: within normal limits Gait & Station: normal Assets  Assets: Manufacturing systems engineer; Desire for Improvement; Resilience; Social Support    Physical Exam: Physical Exam Vitals and nursing note reviewed.  HENT:     Head: Normocephalic.     Nose: Nose normal.  Cardiovascular:     Rate and Rhythm: Normal rate.  Pulmonary:     Effort: Pulmonary effort is normal.  Neurological:     Mental Status: She is alert.    Review of Systems  Constitutional: Negative.   HENT: Negative.    Eyes: Negative.   Cardiovascular: Negative.   Skin: Negative.    Blood pressure 126/62, pulse 63, temperature (!) 97.3 F (36.3 C), resp. rate 18, height 5' 8 (1.727 m), weight 66 kg, SpO2 100%. Body mass index is 22.12 kg/m.  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  Patient has  stopped all her medications and has not followed up with any outpatient psychiatry.  Patient needs close monitoring and medication management during this inpatient hospitalization.   Treatment Plan Summary:   Safety and Monitoring:             -- Voluntary admission to inpatient psychiatric unit for safety, stabilization and treatment             -- Daily contact with patient to assess and evaluate symptoms and progress in treatment             -- Patient's case to be discussed in multi-disciplinary team meeting             -- Observation Level: q15 minute checks             -- Vital signs:  q12 hours             -- Precautions: suicide, elopement, and assault   2. Psychiatric Diagnoses and Treatment:  Added Klonopin  0.25 mg nightly, Zoloft  25 mg daily   -- The risks/benefits/side-effects/alternatives to this medication were discussed in detail with the patient and time was given for questions. The patient consents to medication trial.                -- Metabolic profile and EKG monitoring obtained while on an atypical antipsychotic (BMI: Lipid Panel: HbgA1c: QTc:)              -- Encouraged patient to participate in unit milieu and in scheduled group therapies   4. Discharge Planning:   -- Social work and case management to assist with discharge planning and identification of hospital follow-up needs prior to discharge  -- Estimated LOS: 3-4 days  Mikolaj Woolstenhulme, MD 07/16/2024, 12:32 PM

## 2024-07-16 NOTE — Group Note (Signed)
 Date:  07/16/2024 Time:  9:13 PM  Group Topic/Focus:  Self Care:   The focus of this group is to help patients understand the importance of self-care in order to improve or restore emotional, physical, spiritual, interpersonal, and financial health. MHT went over self care tips with pt's and reminded them that their health is very important.    Participation Level:  Minimal  Participation Quality:  Appropriate  Affect:  Anxious  Cognitive:  Appropriate  Insight: Good  Engagement in Group:  Lacking  Modes of Intervention:  Discussion  Additional Comments:    Alyssa [name] 07/16/2024, 9:13 PM

## 2024-07-16 NOTE — Group Note (Signed)
 Recreation Therapy Group Note   Group Topic:Health and Wellness  Group Date: 07/16/2024 Start Time: 1100 End Time: 1135 Facilitators: Celestia Jeoffrey BRAVO, LRT, CTRS Location: Dayroom  Group Description: Seated Exercise. LRT discussed the mental and physical benefits of exercise. LRT and group discussed how physical activity can be used as a coping skill. Pt's and LRT followed along to an exercise video on the TV screen that provided a visual representation and audio description of every exercise performed. Pt's encouraged to listen to their bodies and stop at any time if they experience feelings of discomfort or pain. Pts were encouraged to drink water  and stay hydrated.   Goal Area(s) Addressed: Patient will learn benefits of physical activity. Patient will identify exercise as a coping skill.  Patient will follow multistep directions. Patient will try a new leisure interest.    Affect/Mood: Flat   Participation Level: Non-verbal    Clinical Observations/Individualized Feedback: Alyssa [name] was present in the craft room at the time of group. Pt did not interact with LRT or peers while in group. Pt did not complete any exercises as prompted.   Plan: Continue to engage patient in RT group sessions 2-3x/week.   Jeoffrey BRAVO Celestia, LRT, CTRS 07/16/2024 1:54 PM

## 2024-07-16 NOTE — Progress Notes (Signed)
 Patient appears suspicious of staff.  Confused, anxious, worried affect. Restless.  Endorses anxiety and depression.  Denies SI/HI and AVH.  Denies pain.    No scheduled medications.  15 min checks in place for safety.  Patient is pacing and fidgety.  Attended groups and meals.  Appropriate interaction with peers,  Minimal interaction with staff.  MHT attempted to assist patient with a shower.  Inially pt was agreeable, but then immediately began walking out of the shower stating she was finished.  Patient wound not allow staff to help her wash.  Irritable and confused.

## 2024-07-17 DIAGNOSIS — F321 Major depressive disorder, single episode, moderate: Secondary | ICD-10-CM | POA: Diagnosis not present

## 2024-07-17 DIAGNOSIS — F411 Generalized anxiety disorder: Secondary | ICD-10-CM | POA: Diagnosis not present

## 2024-07-17 MED ORDER — SERTRALINE HCL 50 MG PO TABS
50.0000 mg | ORAL_TABLET | Freq: Every day | ORAL | Status: DC
  Administered 2024-07-18 – 2024-07-27 (×13): 50 mg via ORAL
  Filled 2024-07-17 (×10): qty 1

## 2024-07-17 MED ORDER — CLONAZEPAM 0.125 MG PO TBDP
0.2500 mg | ORAL_TABLET | Freq: Two times a day (BID) | ORAL | Status: DC
  Administered 2024-07-18 – 2024-07-22 (×16): 0.25 mg via ORAL
  Filled 2024-07-17 (×5): qty 2
  Filled 2024-07-17: qty 1
  Filled 2024-07-17: qty 2
  Filled 2024-07-17: qty 1
  Filled 2024-07-17 (×4): qty 2

## 2024-07-17 MED ORDER — ENSURE PLUS HIGH PROTEIN PO LIQD
237.0000 mL | Freq: Two times a day (BID) | ORAL | Status: DC
  Administered 2024-07-18 – 2024-07-20 (×5): 237 mL via ORAL

## 2024-07-17 NOTE — Progress Notes (Signed)
 Patient is only taking 1 or 2 bites of her meals.   At dinner time, the pt was reaching for food off another patient's tray.    Dr. JINNY is aware of patient's behaviors.

## 2024-07-17 NOTE — Progress Notes (Signed)
 Pt visualized peeking out of her room in the halls staring towards the nurses station. Staff offered assistance to pt. Pt appears confused preoccupied with delayed responses. When pt asked a question pt would just stare at Clinical research associate. Pt expressed not knowing where she was. Pt reoriented. Pt disorganized. Pt mentioned can I just wake up and start over and mentioned something in regards to going to the light. Pt asking staff to help her. When pt offered prn medication pt noted to hold the medication cup and hesitated to take the medication responding I do not think I should. Pt then handed medication cup back to Clinical research associate. Pt then asked Scientist, clinical (histocompatibility and immunogenetics) recommended. Pt encouraged to take medication but, pt continued to refuse. Pt then attempted to take medication again but, was unable to saying no. Pt noted to stand at the nurses station afterwards and asked staff several questions like can I start over? Is it too late to take the medication now? Can I be back there with you? Pt fidgety and noted to pace. Pt pleasant. Pt difficult to redirect at this time. Pt speaking to her self and intermittently makes nonsensical statements out loud. Pt appears to be responding to internal stimuli. Soon after pt complaint with prn Vistaril  and required coaching on how to take the medication. Pt continued to stand up at nurses station and refused to sit down or go back to bed. Pt frequently encouraged. Pt eventually went to her room.     07/16/24 2010  Psych Admission Type (Psych Patients Only)  Admission Status Involuntary  Psychosocial Assessment  Patient Complaints Anxiety;Confusion;Worrying  Eye Contact Fair  Facial Expression Anxious;Worried;Sad  Affect Anxious;Sad;Flat  Speech Soft  Interaction Cautious;Minimal  Motor Activity Fidgety;Pacing  Appearance/Hygiene Unremarkable  Behavior Characteristics Anxious;Restless;Guarded  Mood Anxious;Preoccupied;Pleasant  Thought Process  Coherency Disorganized   Content WDL  Delusions None reported or observed  Perception WDL  Hallucination None reported or observed  Judgment Impaired  Confusion Mild  Danger to Self  Current suicidal ideation? Denies  Danger to Others  Danger to Others None reported or observed

## 2024-07-17 NOTE — Progress Notes (Signed)
 Patient is visibly anxious, fidgeting and picking at her arms.  Pt is only alert and oriented to person.  Vacant stare when sitting with this Clinical research associate.   Compliant with scheduled medication with encouragement from RN.  15 min checks in place for safety.  Patient is present in the milieu.  Interacts sporadically with peers.  Minimal interaction with staff.  Restless and pacing.

## 2024-07-17 NOTE — Group Note (Signed)
 Physical/Occupational Therapy Group Note  Group Topic: Pain Management and Coping   Group Date: 07/17/2024 Start Time: 1300 End Time: 1350 Facilitators: Clive Warren CROME, OT   Group Description:  Group discussed impact of chronic/acute pain on safety and independence with functional tasks and impact on mental health.  Identified and discussed any previously learned or implemented strategies used.  Discussed and reviewed cognitive behavioral pain coping strategies to address/improve overall management of pain. Discussed relaxation, distraction techniques, cognitive restructuring, activity pacing/energy conservation, environment/home safety modifications, and role of sleep and sleep hygiene. Allowed time for questions and further discussion.   Therapeutic Goal(s):   Identify and discuss previously utilized pain coping strategies and implications of pain on function/well-being.  Identify and discuss implementing new cognitive behavioral pain coping strategies into daily routines.  Demonstrate understanding and performance of learned cognitive behavioral pain coping strategies.   Individual Participation: Pt engaged and reserved, only verbally contributing once with prompting. Did actively participate in the progressive muscle relaxation exercise and endorsed feeling that it was helpful afterwards.  Participation Level: Moderate   Participation Quality: Independent   Behavior: Alert, Appropriate, Attentive , Calm, and Passive   Speech/Thought Process: Coherent, Organized, and Relevant   Affect/Mood: Appropriate and Stable    Insight: Good   Judgement: Good   Modes of Intervention: Activity, Clarification, Discussion, Education, Exploration, Problem-solving, and Socialization  Patient Response to Interventions:  Attentive, Engaged, and Receptive   Plan: Continue to engage patient in PT/OT groups 1 - 2x/week.  Quintell Bonnin R., MPH, MS, OTR/L ascom [phone] 07/17/24, 4:18 PM

## 2024-07-17 NOTE — [facility] Suicide Risk Assessment (Signed)
 [facility] INPATIENT:  Family/Significant Other Suicide Prevention Education  Suicide Prevention Education:  Education Completed; Mily Malecki, husband , [phone] ,  (name of family member/significant other) has been identified by the patient as the family member/significant other with whom the patient will be residing, and identified as the person(s) who will aid the patient in the event of a mental health crisis (suicidal ideations/suicide attempt).  With written consent from the patient, the family member/significant other has been provided the following suicide prevention education, prior to the and/or following the discharge of the patient.  The suicide prevention education provided includes the following: Suicide risk factors Suicide prevention and interventions National Suicide Hotline telephone number [facility] assessment telephone number [facility] Emergency Assistance [facility] and/or Residential Mobile Crisis Unit telephone number  Request made of family/significant other to: Remove weapons (e.g., guns, rifles, knives), all items previously/currently identified as safety concern.   Remove drugs/medications (over-the-counter, prescriptions, illicit drugs), all items previously/currently identified as a safety concern.  The family member/significant other verbalizes understanding of the suicide prevention education information provided.  The family member/significant other agrees to remove the items of safety concern listed above.  Lum JONETTA Croft 07/17/2024, 3:19 PM

## 2024-07-17 NOTE — Plan of Care (Signed)
  Problem: Education: Goal: Ability to state activities that reduce stress will improve Outcome: Progressing   Problem: Coping: Goal: Ability to identify and develop effective coping behavior will improve Outcome: Progressing   Problem: Self-Concept: Goal: Ability to identify factors that promote anxiety will improve Outcome: Progressing Goal: Level of anxiety will decrease Outcome: Progressing Goal: Ability to modify response to factors that promote anxiety will improve Outcome: Progressing   Problem: Education: Goal: Utilization of techniques to improve thought processes will improve Outcome: Progressing Goal: Knowledge of the prescribed therapeutic regimen will improve Outcome: Progressing   Problem: Activity: Goal: Interest or engagement in leisure activities will improve Outcome: Progressing Goal: Imbalance in normal sleep/wake cycle will improve Outcome: Progressing   Problem: Coping: Goal: Coping ability will improve Outcome: Progressing Goal: Will verbalize feelings Outcome: Progressing   Problem: Health Behavior/Discharge Planning: Goal: Ability to make decisions will improve Outcome: Progressing Goal: Compliance with therapeutic regimen will improve Outcome: Progressing   Problem: Role Relationship: Goal: Will demonstrate positive changes in social behaviors and relationships Outcome: Progressing   Problem: Safety: Goal: Ability to disclose and discuss suicidal ideas will improve Outcome: Progressing Goal: Ability to identify and utilize support systems that promote safety will improve Outcome: Progressing   Problem: Self-Concept: Goal: Will verbalize positive feelings about self Outcome: Progressing Goal: Level of anxiety will decrease Outcome: Progressing   Problem: Education: Goal: Ability to make informed decisions regarding treatment will improve Outcome: Progressing   Problem: Coping: Goal: Coping ability will improve Outcome:  Progressing   Problem: Health Behavior/Discharge Planning: Goal: Identification of resources available to assist in meeting health care needs will improve Outcome: Progressing   Problem: Medication: Goal: Compliance with prescribed medication regimen will improve Outcome: Progressing   Problem: Self-Concept: Goal: Ability to disclose and discuss suicidal ideas will improve Outcome: Progressing Goal: Will verbalize positive feelings about self Outcome: Progressing Note: Patient is on track. Patient will maintain adherence, work on increased adherence, avoid flare triggers, and be evaluated at upcoming provider appointment to assess progress

## 2024-07-17 NOTE — BHH Counselor (Signed)
 Adult Comprehensive Assessment  Patient ID: Alyssa [name], female   DOB: October 17, 1951, 73 y.o.   MRN: [id number]  Information Source: Information source: Patient  Current Stressors:  Patient states their primary concerns and needs for treatment are:: Mainly depression Patient states their goals for this hospitilization and ongoing recovery are:: improvement Educational / Learning stressors: Pt denies. Employment / Job issues: Pt denies. Family Relationships: Pt denies. Financial / Lack of resources (include bankruptcy): Pt denies. Housing / Lack of housing: Pt denies. Physical health (include injuries & life threatening diseases): Pt denies. Social relationships: Pt denies. Substance abuse: Pt denies. Bereavement / Loss: Pt denies.  Living/Environment/Situation:  Living Arrangements: Spouse/significant other Living conditions (as described by patient or guardian): WNL Who else lives in the home?: Husband' How long has patient lived in current situation?: 3 years What is atmosphere in current home: Comfortable, Loving, Supportive  Family History:  Marital status: Married Number of Years Married: 35 Does patient have children?: No  Childhood History:  By whom was/is the patient raised?: Both parents, Father Description of patient's relationship with caregiver when they were a child: he was supportive Patient's description of current relationship with people who raised him/her: Pt reports that her parents are deceased. How were you disciplined when you got in trouble as a child/adolescent?: just with a word Does patient have siblings?: Yes Number of Siblings: 3 Description of patient's current relationship with siblings: nice Did patient suffer any verbal/emotional/physical/sexual abuse as a child?: No Did patient suffer from severe childhood neglect?: No Has patient ever been sexually abused/assaulted/raped as an adolescent or adult?: No Was the patient ever a  victim of a crime or a disaster?: No Witnessed domestic violence?: No Has patient been affected by domestic violence as an adult?: No  Education:  Highest grade of school patient has completed: masters Currently a Consulting civil engineer?: No Learning disability?: No  Employment/Work Situation:   Employment Situation: Retired Therapist, art is the [employer] Time Patient has Held a Job?: 3 years 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?: No  Financial Resources:   Financial resources: Medicare Does patient have a Lawyer or guardian?: No  Alcohol/Substance Abuse:   What has been your use of drugs/alcohol within the last 12 months?: Pt denies. If attempted suicide, did drugs/alcohol play a role in this?: No Alcohol/Substance Abuse Treatment Hx: Denies past history Has alcohol/substance abuse ever caused legal problems?: No  Social Support System:   Conservation officer, nature Support System: Fair Describe Community Support System: my sister Type of faith/religion: Christian How does patient's faith help to cope with current illness?: read the Bible  Leisure/Recreation:   Do You Have Hobbies?: Yes Leisure and Hobbies: gardening  Strengths/Needs:   What is the patient's perception of their strengths?: I like listening Patient states they can use these personal strengths during their treatment to contribute to their recovery: Pt denies. Patient states these barriers may affect/interfere with their treatment: Pt denies. Patient states these barriers may affect their return to the community: Pt denies. Other important information patient would like considered in planning for their treatment: Pt denies.  Discharge Plan:   Currently receiving community mental health services: No Patient states concerns and preferences for aftercare planning are: Pt reports that she is open to a referral at discharge. Patient states they will know when  they are safe and ready for discharge when: I'll feel calm Does patient have access to transportation?: Yes Does patient have financial  barriers related to discharge medications?: No Will patient be returning to same living situation after discharge?: Yes  Summary/Recommendations:   Summary and Recommendations (to be completed by the evaluator): Patient is a 73 year old female from [place], [place] [facility]).  Patient presents to the hospital for concerns of "isolation, crying spells, irritability, hopelessness, loss of interest to do things they enjoy, fatigue, lack of concentration, insomnia, and decreased appetite.  Patient reports that trigger to her current mental health status has been changes in her medications.  Initial reports also indicate that the patient was inconsistent with her medications due to concerns of the side effects.  She reports that she does not have a current mental health provider, however, is open to a referral at discharge.  Recommendations include: Crisis stabilization, therapeutic milieu, encourage group attendance and participation, medication management for mood stabilization and development of comprehensive mental wellness plan.  Alyssa [name]. 07/17/2024

## 2024-07-17 NOTE — Progress Notes (Signed)
 Pt is disorganized and confused. Anxious and agitated. Fidgeting, restless w/ racing thoughts. Nonsensical speech. Pt said do you hear the sequencing...now we can go through the process.  Zyprexa  2.5 mg po given PRN for agitation. Tol well. Q 15 min checks maintained for safety. Pt slept at short intervals throughout the night.

## 2024-07-17 NOTE — Plan of Care (Signed)
  Problem: Education: Goal: Ability to state activities that reduce stress will improve Outcome: Progressing   Problem: Coping: Goal: Ability to identify and develop effective coping behavior will improve Outcome: Progressing   Problem: Self-Concept: Goal: Ability to identify factors that promote anxiety will improve Outcome: Progressing Goal: Level of anxiety will decrease Outcome: Progressing Goal: Ability to modify response to factors that promote anxiety will improve Outcome: Progressing   Problem: Education: Goal: Utilization of techniques to improve thought processes will improve Outcome: Progressing Goal: Knowledge of the prescribed therapeutic regimen will improve Outcome: Progressing   Problem: Activity: Goal: Interest or engagement in leisure activities will improve Outcome: Progressing Goal: Imbalance in normal sleep/wake cycle will improve Outcome: Progressing   Problem: Coping: Goal: Coping ability will improve Outcome: Progressing Goal: Will verbalize feelings Outcome: Progressing   Problem: Health Behavior/Discharge Planning: Goal: Ability to make decisions will improve Outcome: Progressing Goal: Compliance with therapeutic regimen will improve Outcome: Progressing   Problem: Role Relationship: Goal: Will demonstrate positive changes in social behaviors and relationships Outcome: Progressing   Problem: Safety: Goal: Ability to disclose and discuss suicidal ideas will improve Outcome: Progressing Goal: Ability to identify and utilize support systems that promote safety will improve Outcome: Progressing   Problem: Self-Concept: Goal: Will verbalize positive feelings about self Outcome: Progressing Goal: Level of anxiety will decrease Outcome: Progressing   Problem: Education: Goal: Ability to make informed decisions regarding treatment will improve Outcome: Progressing   Problem: Coping: Goal: Coping ability will improve Outcome:  Progressing   Problem: Health Behavior/Discharge Planning: Goal: Identification of resources available to assist in meeting health care needs will improve Outcome: Progressing   Problem: Medication: Goal: Compliance with prescribed medication regimen will improve Outcome: Progressing   Problem: Self-Concept: Goal: Ability to disclose and discuss suicidal ideas will improve Outcome: Progressing Goal: Will verbalize positive feelings about self Outcome: Progressing Note: Patient is on track. Patient will work on increased adherence, avoid flare triggers, be monitored by provider to determine if a change in treatment plan is warranted, and be evaluated at upcoming provider appointment to assess progress

## 2024-07-17 NOTE — Progress Notes (Signed)
 [facility] MD Progress Note  07/17/2024 10:07 PM Alyssa [name]  MRN:  [id number] Alyssa [name] is a 73 y.o. female admitted: Presented to the ED on 05/14/2024  1:26 PM for  having episodes non stop hours of pacing, fidgeting with hands, saying words that do not make sense, and paranoia. 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 She meets criteria for inpatient psychiatric admission.Patient is admitted to [facility] unit with Q15 min safety monitoring. Multidisciplinary team approach is offered. Medication management; group/milieu therapy is offered.   Subjective:  Chart reviewed, case discussed in multidisciplinary meeting, patient seen during rounds.   Patient is noted to be sitting in the day area and when provider prompted her to come to her room to talk to the provider she was unable to follow the instructions and remained seated.  Provider had to go to her chair and show her directions to get up and walk towards her room.  She denies SI/HI/plan and denies hallucinations.  She denies auditory/visual hallucinations.  She continues to endorse feeling anxious and distracted.  Sleep: Fair  Appetite:  Fair  Past Psychiatric History: see h&P Family History:  Family History  Problem Relation Age of Onset   Hypertension Sister    Thyroid  cancer Brother    Skin cancer Brother    Diabetes Paternal Grandmother    Colon cancer Neg Hx    Breast cancer Neg Hx    Pancreatic cancer Neg Hx    Stomach cancer Neg Hx    Liver cancer Neg Hx    Esophageal cancer Neg Hx    Social History:  Social  History   Substance and Sexual Activity  Alcohol Use Yes   Comment: Rare     Social History   Substance and Sexual Activity  Drug Use Never    Social History   Socioeconomic History   Marital status: Married    Spouse name: Not on file   Number of children: 0   Years of education: Not on file   Highest education level: Master's degree (e.g., MA, MS, MEng, MEd, MSW, MBA)  Occupational History   Occupation: retired, Chief Financial Officer  Tobacco Use   Smoking status: Never   Smokeless tobacco: Never  Vaping Use   Vaping status: Never Used  Substance and Sexual Activity   Alcohol use: Yes    Comment: Rare   Drug use: Never   Sexual activity: Not Currently  Other Topics Concern   Not on file  Social History Narrative   Moved from [place] June 2022   Lives w/ husband   Moved to stay close to her sister (Mrs Andra)   Social Drivers of Health   Financial Resource Strain: Low Risk  (01/27/2023)   Received from [facility]   Overall Financial Resource Strain (CARDIA)    Difficulty of Paying Living Expenses: Not hard at all  Food Insecurity: Patient Declined (07/14/2024)   Hunger Vital Sign    Worried About Running Out of Food in the Last Year: Patient declined    Ran Out of Food in the Last Year: Patient declined  Transportation Needs: Patient Declined (07/14/2024)   PRAPARE - Transportation  Lack of Transportation (Medical): Patient declined    Lack of Transportation (Non-Medical): Patient declined  Physical Activity: Sufficiently Active (01/27/2023)   Received from [facility]   Exercise Vital Sign    On average, how many days per week do you engage in moderate to strenuous exercise (like a brisk walk)?: 5 days    On average, how many minutes do you engage in exercise at this level?: 30 min  Stress: No Stress Concern Present (01/27/2023)   Received from [facility] - Occupational Stress Questionnaire    Feeling of Stress : Only  a little  Social Connections: Moderately Isolated (07/14/2024)   Social Connection and Isolation Panel    Frequency of Communication with Friends and Family: Three times a week    Frequency of Social Gatherings with Friends and Family: Once a week    Attends Religious Services: Never    Database administrator or Organizations: No    Attends Engineer, structural: Never    Marital Status: Married   Past Medical History:  Past Medical History:  Diagnosis Date   Anxiety and depression    Arthritis    Cervicogenic headache    Claustrophobia    Glaucoma    Hypertension    Lumbar radiculopathy    Postsurgical hypothyroidism    Spinal stenosis    Thyroid  cancer (HCC)    s/p thyroidectomy    Past Surgical History:  Procedure Laterality Date   FOOT SURGERY     LAMINOTOMY  04/03/2021   LEFT L3/4 LAMINOTOMY BILATERAL L3/4 MEDIAL FACETECTOMY   LUMBAR LAMINECTOMY  03/03/2018   THYROIDECTOMY  12/2004    Current Medications: Current Facility-Administered Medications  Medication Dose Route Frequency Provider Last Rate Last Admin   acetaminophen  (TYLENOL ) tablet 650 mg  650 mg Oral Q6H PRN Motley-Mangrum, Jadeka A, PMHNP       alum & mag hydroxide-simeth (MAALOX/MYLANTA) 200-200-20 MG/5ML suspension 30 mL  30 mL Oral Q4H PRN Motley-Mangrum, Jadeka A, PMHNP       [START ON 07/18/2024] clonazePAM  (KLONOPIN ) disintegrating tablet 0.25 mg  0.25 mg Oral BID Kalianne Fetting, MD       haloperidol  (HALDOL ) tablet 5 mg  5 mg Oral TID PRN Motley-Mangrum, Jadeka A, PMHNP       And   diphenhydrAMINE  (BENADRYL ) capsule 50 mg  50 mg Oral TID PRN Motley-Mangrum, Jadeka A, PMHNP       haloperidol  lactate (HALDOL ) injection 10 mg  10 mg Intramuscular TID PRN Jadden Yim, MD       And   diphenhydrAMINE  (BENADRYL ) injection 50 mg  50 mg Intramuscular TID PRN Karey Stucki, MD       And   LORazepam  (ATIVAN ) tablet 2 mg  2 mg Oral TID PRN Dewanda Fennema, MD       haloperidol  lactate (HALDOL )  injection 5 mg  5 mg Intramuscular TID PRN Emsley Custer, MD       And   diphenhydrAMINE  (BENADRYL ) injection 50 mg  50 mg Intramuscular TID PRN Orvetta Danielski, MD       And   LORazepam  (ATIVAN ) tablet 2 mg  2 mg Oral TID PRN Deztiny Sarra, MD       NOREEN ON 07/18/2024] feeding supplement (ENSURE PLUS HIGH PROTEIN) liquid 237 mL  237 mL Oral BID BM Yeimy Brabant, MD       hydrOXYzine  (ATARAX ) tablet 25 mg  25 mg Oral TID PRN Motley-Mangrum, Jadeka A, PMHNP  25 mg at 07/17/24 0240   levothyroxine  (SYNTHROID ) tablet 137 mcg  137 mcg Oral Q0600 Motley-Mangrum, Jadeka A, PMHNP   137 mcg at 07/17/24 9376   magnesium  hydroxide (MILK OF MAGNESIA) suspension 30 mL  30 mL Oral Daily PRN Motley-Mangrum, Jadeka A, PMHNP       OLANZapine  zydis (ZYPREXA ) disintegrating tablet 2.5 mg  2.5 mg Oral Q8H PRN Motley-Mangrum, Jadeka A, PMHNP   2.5 mg at 07/17/24 0617   [START ON 07/18/2024] sertraline  (ZOLOFT ) tablet 50 mg  50 mg Oral Daily Sohail Capraro, MD       traZODone  (DESYREL ) tablet 50 mg  50 mg Oral QHS PRN Motley-Mangrum, Jadeka A, PMHNP   50 mg at 07/17/24 2151    Lab Results: No results found for this or any previous visit (from the past 48 hours).  Blood Alcohol level:  Lab Results  Component Value Date   [facility] <15 07/13/2024   ETH <15 05/14/2024    Metabolic Disorder Labs: Lab Results  Component Value Date   HGBA1C 5.1 06/07/2022   MPG 100 06/07/2022   No results found for: PROLACTIN Lab Results  Component Value Date   CHOL 309 (H) 06/07/2022   TRIG 62 06/07/2022   HDL 75 06/07/2022   CHOLHDL 4.1 06/07/2022   VLDL 12 06/07/2022   LDLCALC 222 (H) 06/07/2022   LDLCALC 125 (H) 11/27/2021    Physical Findings: AIMS:  , ,  ,  ,    CIWA:    COWS:      Psychiatric Specialty Exam:  Presentation  General Appearance:  Appropriate for Environment; Casual  Eye Contact: Fair  Speech: Slow  Speech Volume: Normal    Mood and Affect  Mood: Anxious;  Depressed  Affect: Depressed; Flat   Thought Process  Thought Processes: Irrevelant  Descriptions of Associations:Intact  Orientation:Partial  Thought Content:Illogical  Hallucinations: Denies  Ideas of Reference:Paranoia  Suicidal Thoughts: Denies Homicidal Thoughts: Denies   Sensorium  Memory: Immediate Fair; Recent Fair; Remote Poor  Judgment: Impaired  Insight: Shallow   Executive Functions  Concentration: Poor  Attention Span: Poor  Recall: Poor  Fund of Knowledge: Fair  Language: Fair   Psychomotor Activity  Psychomotor Activity: No data recorded  Musculoskeletal: Strength & Muscle Tone: within normal limits Gait & Station: normal Assets  Assets: Manufacturing systems engineer; Desire for Improvement; Resilience; Social Support    Physical Exam: Physical Exam Vitals and nursing note reviewed.  HENT:     Head: Normocephalic.     Nose: Nose normal.  Cardiovascular:     Rate and Rhythm: Normal rate.  Pulmonary:     Effort: Pulmonary effort is normal.  Neurological:     Mental Status: She is alert.    Review of Systems  Constitutional: Negative.   HENT: Negative.    Eyes: Negative.   Cardiovascular: Negative.   Skin: Negative.    Blood pressure (!) 141/77, pulse 71, temperature 97.9 F (36.6 C), resp. rate 14, height 5' 8 (1.727 m), weight 66 kg, SpO2 100%. Body mass index is 22.12 kg/m.  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  Patient has stopped all her medications and has not followed up with any  outpatient psychiatry.  Patient  needs close monitoring and medication management during this inpatient hospitalization.   Treatment Plan Summary:   Safety and Monitoring:             -- Voluntary admission to inpatient psychiatric unit for safety, stabilization and treatment             -- Daily contact with patient to assess and evaluate symptoms and progress in treatment             -- Patient's case to be discussed in multi-disciplinary team meeting             -- Observation Level: q15 minute checks             -- Vital signs:  q12 hours             -- Precautions: suicide, elopement, and assault   2. Psychiatric Diagnoses and Treatment:  Added Klonopin  0.25 mg nightly, Zoloft  25 mg daily   -- The risks/benefits/side-effects/alternatives to this medication were discussed in detail with the patient and time was given for questions. The patient consents to medication trial.                -- Metabolic profile and EKG monitoring obtained while on an atypical antipsychotic (BMI: Lipid Panel: HbgA1c: QTc:)              -- Encouraged patient to participate in unit milieu and in scheduled group therapies   4. Discharge Planning:   -- Social work and case management to assist with discharge planning and identification of hospital follow-up needs prior to discharge  -- Estimated LOS: 3-4 days  Traeton Bordas, MD 07/17/2024, 10:07 PM

## 2024-07-17 NOTE — Progress Notes (Signed)
   07/17/24 2200  Psych Admission Type (Psych Patients Only)  Admission Status Involuntary  Psychosocial Assessment  Patient Complaints Anxiety;Confusion;Depression;Insomnia;Restlessness  Eye Contact Fair  Facial Expression Worried  Affect Anxious  Speech Soft  Interaction Minimal;Cautious  Motor Activity Slow  Appearance/Hygiene In scrubs  Behavior Characteristics Cooperative  Mood Anxious;Pleasant  Thought Process  Coherency Disorganized  Content UTA  Delusions UTA  Perception WDL  Hallucination UTA  Judgment Impaired  Confusion Mild  Danger to Self  Current suicidal ideation? Denies  Danger to Others  Danger to Others None reported or observed

## 2024-07-17 NOTE — Plan of Care (Signed)
  Problem: Self-Concept: Goal: Level of anxiety will decrease Outcome: Not Progressing   Problem: Education: Goal: Knowledge of the prescribed therapeutic regimen will improve Outcome: Not Progressing

## 2024-07-17 NOTE — Group Note (Signed)
 Date:  07/17/2024 Time:  11:16 AM  Group Topic/Focus:  Coping With Mental Health Crisis:   The purpose of this group is to help patients identify strategies for coping with mental health crisis.  Group discusses possible causes of crisis and ways to manage them effectively.    Participation Level:  Active  Participation Quality:  Attentive  Affect:  Appropriate  Cognitive:  Alert  Insight: Appropriate  Engagement in Group:  Engaged  Modes of Intervention:  Education  Additional Comments:  N/A  Harlene LITTIE Gavel 07/17/2024, 11:16 AM

## 2024-07-17 NOTE — Group Note (Signed)
 Date:  07/18/2024 Time:  12:27 AM  Group Topic/Focus:  Wrap-Up Group:   The focus of this group is to help patients review their daily goal of treatment and discuss progress on daily workbooks.    Participation Level:  Minimal  Participation Quality:  Attentive  Affect:  Appropriate  Cognitive:  Alert  Insight: Good  Engagement in Group:  Engaged  Modes of Intervention:  Discussion  Additional Comments:    Alyssa [name] 07/18/2024, 12:27 AM

## 2024-07-17 NOTE — BHH Counselor (Signed)
 CSW contacted Alyssa [name],  husband, [phone] per request of CSW Sherryle.   CSW Sherryle was completing SPE calls and contacted Alyssa [name]. Alyssa [name] reports he had not heard from CSW today.   CSW had to document SPE call completion today as CSW completed SPE with pt yesterday following family call with Dr.J and did not document it.   CSW contacted Alyssa [name].   Alyssa [name] reports multiple concerns he states he forgot to mention during family call yesterday.   Alyssa [name] reports that he has concerns about pt's Tendency toward catatonia he reports Kathlynn has always been devoted to her dog and extremely concerned with the dog's care and feeding. Reports a few days before her collapse, [place] started telling him that the dog was his responsibility now.  Reports he does not know if pt is feeling suicidal but reports nothing like that has ever happened in their 62 yeas of marriage.   CSW reports to Alyssa that CSW will inform provider of his concerns.   Lum Croft, MSW, [place] 07/17/2024 4:17 PM

## 2024-07-17 NOTE — Group Note (Signed)
 Recreation Therapy Group Note   Group Topic:Leisure Education  Group Date: 07/17/2024 Start Time: 1500 End Time: 1600 Facilitators: Celestia Jeoffrey BRAVO, LRT, CTRS Location: Courtyard  Group Description: Outdoor Recreation. Patients had the option to play corn hole, ring toss, bowling or listening to music while outside in the courtyard getting fresh air and sunlight. Patients helped water  and prune the raised garden beds. LRT and patients discussed things that they enjoy doing in their free time outside of the hospital. LRT encouraged patients to drink water  after being active and getting their heart rate up.   Goal Area(s) Addressed: Patient will identify leisure interests.  Patient will practice healthy decision making. Patient will engage in recreation activity.   Affect/Mood: Blunted and Flat   Participation Level: Moderate   Participation Quality: Minimal Cues   Behavior: Bizarre and Hesitant   Speech/Thought Process: Disorganized   Insight: Limited   Judgement: Fair    Modes of Intervention: Cooperative Play, Music, Rapport Building, and Socialization   Patient Response to Interventions:  Disengaged   Education Outcome:  In group clarification offered    Clinical Observations/Individualized Feedback: Alyssa [name] was mostly active in their participation of session activities and group discussion. Pt played corn hole with the encouragement from LRT. Pt did not interact with LRT or peers while outside in group.    Plan: Continue to engage patient in RT group sessions 2-3x/week.   Jeoffrey BRAVO Celestia, LRT, CTRS 07/17/2024 5:23 PM

## 2024-07-18 DIAGNOSIS — F321 Major depressive disorder, single episode, moderate: Secondary | ICD-10-CM | POA: Diagnosis not present

## 2024-07-18 DIAGNOSIS — F411 Generalized anxiety disorder: Secondary | ICD-10-CM | POA: Diagnosis not present

## 2024-07-18 NOTE — Progress Notes (Signed)
 Alyssa [name] Alyssa [name]  MRN:  [id number] Alyssa [name] is a 73 y.o. female admitted: Presented to the ED on 05/14/2024  1:26 [name] for  having episodes non stop hours of pacing, fidgeting with hands, saying words that do not make sense, and paranoia. 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 She meets criteria for inpatient psychiatric admission.Patient is admitted to [facility] unit with Q15 min safety monitoring. Multidisciplinary team approach is offered. Medication management; group/milieu therapy is offered.   Subjective:  Chart reviewed, case discussed in multidisciplinary meeting, patient seen during rounds.  Patient is noted to be talking to peers.  She was more engaging today in the interview.  She reports her anxiety and depression improving.  Provider discussed the the medications of increasing the Klonopin  to twice daily dosing and Zoloft  increasing the dose.  Per nursing staff patient needs redirection as she was following a patient into his room due to the confusion but patient was redirected by the staff.  She denies SI/HI/plan and denies hallucinations.  She is tolerating medications well with no reported side effects as of today.  Sleep: Fair  Appetite:  Fair  Past Psychiatric History: see h&P Family History:  Family History  Problem Relation Age of Onset   Hypertension Sister    Thyroid  cancer Brother    Skin cancer Brother    Diabetes Paternal Grandmother    Colon cancer Neg Hx    Breast cancer Neg Hx    Pancreatic cancer Neg Hx     Stomach cancer Neg Hx    Liver cancer Neg Hx    Esophageal cancer Neg Hx    Social History:  Social History   Substance and Sexual Activity  Alcohol Use Yes   Comment: Rare     Social History   Substance and Sexual Activity  Drug Use Never    Social History   Socioeconomic History   Marital status: Married    Spouse name: Not on file   Number of children: 0   Years of education: Not on file   Highest education level: Master's degree (e.g., MA, MS, MEng, MEd, MSW, MBA)  Occupational History   Occupation: retired, Chief Financial Officer  Tobacco Use   Smoking status: Never   Smokeless tobacco: Never  Vaping Use   Vaping status: Never Used  Substance and Sexual Activity   Alcohol use: Yes    Comment: Rare   Drug use: Never   Sexual activity: Not Currently  Other Topics Concern   Not on file  Social History Narrative   Moved from [place] June 2022   Lives w/ husband   Moved to stay close to her sister (Mrs Andra)   Social Drivers of Health   Financial Resource Strain: Low Risk  (01/27/2023)   Received from [facility]   Overall Financial Resource Strain (CARDIA)    Difficulty of Paying Living Expenses: Not hard at all  Food Insecurity: Patient Declined (07/14/2024)   Hunger Vital Sign    Worried About Running Out of Food in the Last Year: Patient declined    Ran Out of Food in the Last Year:  Patient declined  Transportation Needs: Patient Declined (07/14/2024)   PRAPARE - Administrator, Civil Service (Medical): Patient declined    Lack of Transportation (Non-Medical): Patient declined  Physical Activity: Sufficiently Active (01/27/2023)   Received from [facility]   Exercise Vital Sign    On average, how many days per week do you engage in moderate to strenuous exercise (like a brisk walk)?: 5 days    On average, how many minutes do you engage in exercise at this level?: 30 min  Stress: No Stress Concern Present (01/27/2023)   Received from [facility] - Occupational Stress Questionnaire    Feeling of Stress : Only a little  Social Connections: Moderately Isolated (07/14/2024)   Social Connection and Isolation Panel    Frequency of Communication with Friends and Family: Three times a week    Frequency of Social Gatherings with Friends and Family: Once a week    Attends Religious Services: Never    Database administrator or Organizations: No    Attends Engineer, structural: Never    Marital Status: Married   Past Medical History:  Past Medical History:  Diagnosis Date   Anxiety and depression    Arthritis    Cervicogenic headache    Claustrophobia    Glaucoma    Hypertension    Lumbar radiculopathy    Postsurgical hypothyroidism    Spinal stenosis    Thyroid  cancer (HCC)    s/p thyroidectomy    Past Surgical History:  Procedure Laterality Date   FOOT SURGERY     LAMINOTOMY  04/03/2021   LEFT L3/4 LAMINOTOMY BILATERAL L3/4 MEDIAL FACETECTOMY   LUMBAR LAMINECTOMY  03/03/2018   THYROIDECTOMY  12/2004    Current Medications: Current Facility-Administered Medications  Medication Dose Route Frequency Provider Last Rate Last Admin   acetaminophen  (TYLENOL ) tablet 650 mg  650 mg Oral Q6H PRN Motley-Mangrum, Jadeka A, PMHNP       alum & mag hydroxide-simeth (MAALOX/MYLANTA) 200-200-20 MG/5ML suspension 30 mL  30 mL Oral Q4H PRN Motley-Mangrum, Jadeka A, PMHNP       clonazePAM  (KLONOPIN ) disintegrating tablet 0.25 mg  0.25 mg Oral BID Shabreka Coulon, MD   0.25 mg at 07/18/24 0919   haloperidol  (HALDOL ) tablet 5 mg  5 mg Oral TID PRN Motley-Mangrum, Jadeka A, PMHNP       And   diphenhydrAMINE  (BENADRYL ) capsule 50 mg  50 mg Oral TID PRN Motley-Mangrum, Jadeka A, PMHNP       haloperidol  lactate (HALDOL ) injection 10 mg  10 mg Intramuscular TID PRN Lamonte Hartt, MD       And   diphenhydrAMINE  (BENADRYL ) injection 50 mg  50 mg Intramuscular TID PRN Donnelly Mellow, MD       And    LORazepam  (ATIVAN ) tablet 2 mg  2 mg Oral TID PRN Georgi Tuel, MD       haloperidol  lactate (HALDOL ) injection 5 mg  5 mg Intramuscular TID PRN Zakariye Nee, MD       And   diphenhydrAMINE  (BENADRYL ) injection 50 mg  50 mg Intramuscular TID PRN Leahann Lempke, MD       And   LORazepam  (ATIVAN ) tablet 2 mg  2 mg Oral TID PRN Kuzey Ogata, MD       feeding supplement (ENSURE PLUS HIGH PROTEIN) liquid 237 mL  237 mL Oral BID BM Helio Lack, MD   237 mL at 07/18/24 [phone]  hydrOXYzine  (ATARAX ) tablet 25 mg  25 mg Oral TID PRN Motley-Mangrum, Jadeka A, PMHNP   25 mg at 07/18/24 0046   levothyroxine  (SYNTHROID ) tablet 137 mcg  137 mcg Oral Q0600 Motley-Mangrum, Jadeka A, PMHNP   137 mcg at 07/18/24 0636   magnesium  hydroxide (MILK OF MAGNESIA) suspension 30 mL  30 mL Oral Daily PRN Motley-Mangrum, Jadeka A, PMHNP       OLANZapine  zydis (ZYPREXA ) disintegrating tablet 2.5 mg  2.5 mg Oral Q8H PRN Motley-Mangrum, Jadeka A, PMHNP   2.5 mg at 07/17/24 0617   sertraline  (ZOLOFT ) tablet 50 mg  50 mg Oral Daily Cleva Camero, MD   50 mg at 07/18/24 0919   traZODone  (DESYREL ) tablet 50 mg  50 mg Oral QHS PRN Motley-Mangrum, Jadeka A, PMHNP   50 mg at 07/17/24 2151    Lab Results: No results found for this or any previous visit (from the past 48 hours).  Blood Alcohol level:  Lab Results  Component Value Date   [facility] <15 07/13/2024   ETH <15 05/14/2024    Metabolic Disorder Labs: Lab Results  Component Value Date   HGBA1C 5.1 06/07/2022   MPG 100 06/07/2022   No results found for: PROLACTIN Lab Results  Component Value Date   CHOL 309 (H) 06/07/2022   TRIG 62 06/07/2022   HDL 75 06/07/2022   CHOLHDL 4.1 06/07/2022   VLDL 12 06/07/2022   LDLCALC 222 (H) 06/07/2022   LDLCALC 125 (H) 11/27/2021    Physical Findings: AIMS:  , ,  ,  ,    CIWA:    COWS:      Psychiatric Specialty Exam:  Presentation  General Appearance:  Appropriate for Environment; Casual  Eye  Contact: Fair  Speech: Slow  Speech Volume: Normal    Mood and Affect  Mood: Anxious; Depressed  Affect: Depressed; Flat   Thought Process  Thought Processes: Irrevelant  Descriptions of Associations:Intact  Orientation:Partial  Thought Content:Illogical  Hallucinations: Denies  Ideas of Reference:Paranoia  Suicidal Thoughts: Denies Homicidal Thoughts: Denies   Sensorium  Memory: Immediate Fair; Recent Fair; Remote Poor  Judgment: Impaired  Insight: Shallow   Executive Functions  Concentration: Poor  Attention Span: Poor  Recall: Poor  Fund of Knowledge: Fair  Language: Fair   Psychomotor Activity  Psychomotor Activity: No data recorded  Musculoskeletal: Strength & Muscle Tone: within normal limits Gait & Station: normal Assets  Assets: Manufacturing systems engineer; Desire for Improvement; Resilience; Social Support    Physical Exam: Physical Exam Vitals and nursing [name] reviewed.  HENT:     Head: Normocephalic.     Nose: Nose normal.  Cardiovascular:     Rate and Rhythm: Normal rate.  Pulmonary:     Effort: Pulmonary effort is normal.  Neurological:     Mental Status: She is alert.    Review of Systems  Constitutional: Negative.   HENT: Negative.    Eyes: Negative.   Cardiovascular: Negative.   Skin: Negative.    Blood pressure (!) 144/121, pulse 90, temperature 98 F (36.7 C), resp. rate 18, height 5' 8 (1.727 m), weight 66 kg, SpO2 98%. Body mass index is 22.12 kg/m.  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  Patient  has stopped all her medications and has not followed up with any outpatient psychiatry.  Patient needs close monitoring and medication management during this inpatient hospitalization.   Treatment Plan Summary:   Safety and Monitoring:             -- Voluntary admission to inpatient psychiatric unit for safety, stabilization and treatment             -- Daily contact with patient to assess and evaluate symptoms and progress in treatment             -- Patient's case to be discussed in multi-disciplinary team meeting             -- Observation Level: q15 minute checks             -- Vital signs:  q12 hours             -- Precautions: suicide, elopement, and assault   2. Psychiatric Diagnoses and Treatment:  Increased Klonopin  0.25 mg BID, Zoloft  50 mg daily   -- The risks/benefits/side-effects/alternatives to this medication were discussed in detail with the patient and time was given for questions. The patient consents to medication trial.                -- Metabolic profile and EKG monitoring obtained while on an atypical antipsychotic (BMI: Lipid Panel: HbgA1c: QTc:)              -- Encouraged patient to participate in unit milieu and in scheduled group therapies   4. Discharge Planning:   -- Social work and case management to assist with discharge planning and identification of hospital follow-up needs prior to discharge  -- Estimated LOS: 3-4 days  Tyronne Blann, MD 07/18/2024, 1:55 [name]

## 2024-07-18 NOTE — Plan of Care (Signed)
  Problem: Self-Concept: Goal: Level of anxiety will decrease Outcome: Progressing   Problem: Coping: Goal: Will verbalize feelings Outcome: Progressing

## 2024-07-18 NOTE — Group Note (Signed)
 Date:  07/18/2024 Time:  12:35 PM  Group Topic/Focus:  Goals Group:   The focus of this group is to help patients establish daily goals to achieve during treatment and discuss how the patient can incorporate goal setting into their daily lives to aide in recovery.    Participation Level:  Minimal  Participation Quality:  Attentive  Affect:  Appropriate  Cognitive:  Alert and Appropriate  Insight: Limited  Engagement in Group:  Limited  Modes of Intervention:  Activity, Confrontation, Discussion, Socialization, and Support  Additional Comments:  n/a  Alyssa [name] 07/18/2024, 12:35 PM

## 2024-07-18 NOTE — Group Note (Signed)
 [facility] LCSW Group Therapy Note   Group Date: 07/18/2024 Start Time: 1400 End Time: 1445   Type of Therapy/Topic:  Group Therapy:  Emotion Regulation  Participation Level:  Active   Mood:  Description of Group:    The purpose of this group is to assist patients in learning to regulate negative emotions and experience positive emotions. Patients will be guided to discuss ways in which they have been vulnerable to their negative emotions. These vulnerabilities will be juxtaposed with experiences of positive emotions or situations, and patients challenged to use positive emotions to combat negative ones. Special emphasis will be placed on coping with negative emotions in conflict situations, and patients will process healthy conflict resolution skills.  Therapeutic Goals: Patient will identify two positive emotions or experiences to reflect on in order to balance out negative emotions:  Patient will label two or more emotions that they find the most difficult to experience:  Patient will be able to demonstrate positive conflict resolution skills through discussion or role plays:   Summary of Patient Progress:   Patient was alert and active in group. Patient was able to describe different coping strategies to assist with regulating emotions.     Therapeutic Modalities:   Cognitive Behavioral Therapy Feelings Identification Dialectical Behavioral Therapy   Aldo HERO Jorie Zee, LCSW

## 2024-07-18 NOTE — Progress Notes (Signed)
   07/18/24 0925  Psych Admission Type (Psych Patients Only)  Admission Status Involuntary  Psychosocial Assessment  Patient Complaints Anxiety;Depression;Confusion  Eye Contact Fair  Facial Expression Worried  Affect Angry  Speech Soft  Interaction Minimal  Motor Activity Slow  Appearance/Hygiene Unremarkable;In scrubs  Behavior Characteristics Cooperative  Mood Anxious  Thought Process  Coherency Disorganized  Content UTA  Delusions None reported or observed;UTA  Perception WDL  Hallucination None reported or observed  Judgment Impaired  Confusion Mild  Danger to Self  Current suicidal ideation? Denies  Danger to Others  Danger to Others None reported or observed

## 2024-07-19 DIAGNOSIS — F411 Generalized anxiety disorder: Secondary | ICD-10-CM | POA: Diagnosis not present

## 2024-07-19 DIAGNOSIS — F321 Major depressive disorder, single episode, moderate: Secondary | ICD-10-CM | POA: Diagnosis not present

## 2024-07-19 NOTE — Plan of Care (Signed)
  Problem: Education: Goal: Ability to state activities that reduce stress will improve 07/19/2024 0625 by Ezzard Stank, RN Outcome: Progressing 07/19/2024 0625 by Ezzard Stank, RN Outcome: Progressing   Problem: Coping: Goal: Ability to identify and develop effective coping behavior will improve Outcome: Progressing   Problem: Self-Concept: Goal: Ability to identify factors that promote anxiety will improve Outcome: Progressing Goal: Level of anxiety will decrease Outcome: Progressing

## 2024-07-19 NOTE — Progress Notes (Signed)
 [facility] MD Progress Note  07/19/2024 12:38 PM Alyssa [name]  MRN:  [id number] Alyssa [name] is a 73 y.o. female admitted: Presented to the ED on 05/14/2024  1:26 PM for  having episodes non stop hours of pacing, fidgeting with hands, saying words that do not make sense, and paranoia. 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 She meets criteria for inpatient psychiatric admission.Patient is admitted to [facility] unit with Q15 min safety monitoring. Multidisciplinary team approach is offered. Medication management; group/milieu therapy is offered.   Subjective:  Chart reviewed, case discussed in multidisciplinary meeting, patient seen during rounds.  Today on interview patient remains confused.  She talks about the food of other patients being different, smelling different than her food.  When provider asked why cannot she ordered food that the other patients ordered she reports even then it will smell different .  Patient is unable to logically explain.  Per nursing report patient displays paranoia about her medications and food.  Patient reports her anxiety and depression are improving.  She denies SI/HI/plan and denies hallucinations. Sleep: Fair  Appetite:  Fair  Past Psychiatric History: see h&P Family History:  Family History  Problem Relation Age of Onset   Hypertension Sister    Thyroid  cancer Brother    Skin cancer Brother    Diabetes Paternal Grandmother    Colon cancer Neg Hx    Breast cancer Neg Hx    Pancreatic cancer Neg Hx    Stomach cancer Neg Hx    Liver cancer Neg  Hx    Esophageal cancer Neg Hx    Social History:  Social History   Substance and Sexual Activity  Alcohol Use Yes   Comment: Rare     Social History   Substance and Sexual Activity  Drug Use Never    Social History   Socioeconomic History   Marital status: Married    Spouse name: Not on file   Number of children: 0   Years of education: Not on file   Highest education level: Master's degree (e.g., MA, MS, MEng, MEd, MSW, MBA)  Occupational History   Occupation: retired, Chief Financial Officer  Tobacco Use   Smoking status: Never   Smokeless tobacco: Never  Vaping Use   Vaping status: Never Used  Substance and Sexual Activity   Alcohol use: Yes    Comment: Rare   Drug use: Never   Sexual activity: Not Currently  Other Topics Concern   Not on file  Social History Narrative   Moved from [place] June 2022   Lives w/ husband   Moved to stay close to her sister (Mrs Andra)   Social Drivers of Health   Financial Resource Strain: Low Risk  (01/27/2023)   Received from [facility]   Overall Financial Resource Strain (CARDIA)    Difficulty of Paying Living Expenses: Not hard at all  Food Insecurity: Patient Declined (07/14/2024)   Hunger Vital Sign    Worried About Running Out of Food in the Last Year: Patient declined    Ran Out of Food in the Last Year: Patient declined  Transportation Needs: Patient Declined (07/14/2024)   PRAPARE - Transportation  Lack of Transportation (Medical): Patient declined    Lack of Transportation (Non-Medical): Patient declined  Physical Activity: Sufficiently Active (01/27/2023)   Received from [facility]   Exercise Vital Sign    On average, how many days per week do you engage in moderate to strenuous exercise (like a brisk walk)?: 5 days    On average, how many minutes do you engage in exercise at this level?: 30 min  Stress: No Stress Concern Present (01/27/2023)   Received from [facility] -  Occupational Stress Questionnaire    Feeling of Stress : Only a little  Social Connections: Moderately Isolated (07/14/2024)   Social Connection and Isolation Panel    Frequency of Communication with Friends and Family: Three times a week    Frequency of Social Gatherings with Friends and Family: Once a week    Attends Religious Services: Never    Database administrator or Organizations: No    Attends Engineer, structural: Never    Marital Status: Married   Past Medical History:  Past Medical History:  Diagnosis Date   Anxiety and depression    Arthritis    Cervicogenic headache    Claustrophobia    Glaucoma    Hypertension    Lumbar radiculopathy    Postsurgical hypothyroidism    Spinal stenosis    Thyroid  cancer (HCC)    s/p thyroidectomy    Past Surgical History:  Procedure Laterality Date   FOOT SURGERY     LAMINOTOMY  04/03/2021   LEFT L3/4 LAMINOTOMY BILATERAL L3/4 MEDIAL FACETECTOMY   LUMBAR LAMINECTOMY  03/03/2018   THYROIDECTOMY  12/2004    Current Medications: Current Facility-Administered Medications  Medication Dose Route Frequency Provider Last Rate Last Admin   acetaminophen  (TYLENOL ) tablet 650 mg  650 mg Oral Q6H PRN Motley-Mangrum, Jadeka A, PMHNP       alum & mag hydroxide-simeth (MAALOX/MYLANTA) 200-200-20 MG/5ML suspension 30 mL  30 mL Oral Q4H PRN Motley-Mangrum, Jadeka A, PMHNP       clonazepam  (KLONOPIN ) disintegrating tablet 0.25 mg  0.25 mg Oral BID Sonora Catlin, MD   0.25 mg at 07/19/24 9097   haloperidol  (HALDOL ) tablet 5 mg  5 mg Oral TID PRN Motley-Mangrum, Jadeka A, PMHNP       And   diphenhydrAMINE  (BENADRYL ) capsule 50 mg  50 mg Oral TID PRN Motley-Mangrum, Jadeka A, PMHNP       haloperidol  lactate (HALDOL ) injection 10 mg  10 mg Intramuscular TID PRN Prajna Vanderpool, MD       And   diphenhydrAMINE  (BENADRYL ) injection 50 mg  50 mg Intramuscular TID PRN Donnelly Mellow, MD       And   LORazepam  (ATIVAN ) tablet 2 mg  2 mg Oral  TID PRN Shauntell Iglesia, MD       haloperidol  lactate (HALDOL ) injection 5 mg  5 mg Intramuscular TID PRN Eneida Evers, MD       And   diphenhydrAMINE  (BENADRYL ) injection 50 mg  50 mg Intramuscular TID PRN Fatema Rabe, MD       And   LORazepam  (ATIVAN ) tablet 2 mg  2 mg Oral TID PRN Jalisha Enneking, MD       feeding supplement (ENSURE PLUS HIGH PROTEIN) liquid 237 mL  237 mL Oral BID BM Dmitry Macomber, MD   237 mL at 07/19/24 1109   hydrOXYzine  (ATARAX ) tablet 25 mg  25 mg Oral TID PRN Motley-Mangrum, Jadeka A, PMHNP  25 mg at 07/18/24 0046   levothyroxine  (SYNTHROID ) tablet 137 mcg  137 mcg Oral Q0600 Motley-Mangrum, Jadeka A, PMHNP   137 mcg at 07/19/24 0600   magnesium  hydroxide (MILK OF MAGNESIA) suspension 30 mL  30 mL Oral Daily PRN Motley-Mangrum, Jadeka A, PMHNP       OLANZapine  zydis (ZYPREXA ) disintegrating tablet 2.5 mg  2.5 mg Oral Q8H PRN Motley-Mangrum, Jadeka A, PMHNP   2.5 mg at 07/17/24 0617   sertraline  (ZOLOFT ) tablet 50 mg  50 mg Oral Daily Yamila Cragin, MD   50 mg at 07/19/24 0902   traZODone  (DESYREL ) tablet 50 mg  50 mg Oral QHS PRN Motley-Mangrum, Jadeka A, PMHNP   50 mg at 07/18/24 2250    Lab Results: No results found for this or any previous visit (from the past 48 hours).  Blood Alcohol level:  Lab Results  Component Value Date   [facility] <15 07/13/2024   ETH <15 05/14/2024    Metabolic Disorder Labs: Lab Results  Component Value Date   HGBA1C 5.1 06/07/2022   MPG 100 06/07/2022   No results found for: PROLACTIN Lab Results  Component Value Date   CHOL 309 (H) 06/07/2022   TRIG 62 06/07/2022   HDL 75 06/07/2022   CHOLHDL 4.1 06/07/2022   VLDL 12 06/07/2022   LDLCALC 222 (H) 06/07/2022   LDLCALC 125 (H) 11/27/2021    Physical Findings: AIMS:  , ,  ,  ,    CIWA:    COWS:      Psychiatric Specialty Exam:  Presentation  General Appearance:  Appropriate for Environment; Casual  Eye Contact: Fair  Speech: Slow  Speech  Volume: Normal    Mood and Affect  Mood: Anxious; Depressed  Affect: Depressed; Flat   Thought Process  Thought Processes: Irrevelant  Descriptions of Associations:Intact  Orientation:Partial  Thought Content:Illogical  Hallucinations: Denies  Ideas of Reference:Paranoia  Suicidal Thoughts: Denies Homicidal Thoughts: Denies   Sensorium  Memory: Immediate Fair; Recent Fair; Remote Poor  Judgment: Impaired  Insight: Shallow   Executive Functions  Concentration: Poor  Attention Span: Poor  Recall: Poor  Fund of Knowledge: Fair  Language: Fair   Psychomotor Activity  Psychomotor Activity: No data recorded  Musculoskeletal: Strength & Muscle Tone: within normal limits Gait & Station: normal Assets  Assets: Manufacturing systems engineer; Desire for Improvement; Resilience; Social Support    Physical Exam: Physical Exam Vitals and nursing note reviewed.  HENT:     Head: Normocephalic.     Nose: Nose normal.  Cardiovascular:     Rate and Rhythm: Normal rate.  Pulmonary:     Effort: Pulmonary effort is normal.  Neurological:     Mental Status: She is alert.    Review of Systems  Constitutional: Negative.   HENT: Negative.    Eyes: Negative.   Cardiovascular: Negative.   Skin: Negative.    Blood pressure 137/75, pulse 68, temperature 97.7 F (36.5 C), resp. rate 18, height 5' 8 (1.727 m), weight 66 kg, SpO2 99%. Body mass index is 22.12 kg/m.  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  Patient has stopped all her medications and has not  followed up with any  outpatient psychiatry.  Patient needs close monitoring and medication management during this inpatient hospitalization.   Treatment Plan Summary:   Safety and Monitoring:             -- Voluntary admission to inpatient psychiatric unit for safety, stabilization and treatment             -- Daily contact with patient to assess and evaluate symptoms and progress in treatment             -- Patient's case to be discussed in multi-disciplinary team meeting             -- Observation Level: q15 minute checks             -- Vital signs:  q12 hours             -- Precautions: suicide, elopement, and assault   2. Psychiatric Diagnoses and Treatment:  Increased Klonopin  0.25 mg BID, Zoloft  50 mg daily Will consider adding Abilify  to help with paranoia   -- The risks/benefits/side-effects/alternatives to this medication were discussed in detail with the patient and time was given for questions. The patient consents to medication trial.                -- Metabolic profile and EKG monitoring obtained while on an atypical antipsychotic (BMI: Lipid Panel: HbgA1c: QTc:)              -- Encouraged patient to participate in unit milieu and in scheduled group therapies   4. Discharge Planning:   -- Social work and case management to assist with discharge planning and identification of hospital follow-up needs prior to discharge  -- Estimated LOS: 3-4 days  Makena Murdock, MD 07/19/2024, 12:38 PM

## 2024-07-19 NOTE — Group Note (Signed)
 Date:  07/19/2024 Time:  6:25 PM  Group Topic/Focus:  Developing a Wellness Toolbox:   The focus of this group is to help patients develop a wellness toolbox with skills and strategies to promote recovery upon discharge.    Participation Level:  Active  Participation Quality:  Attentive  Affect:  Appropriate  Cognitive:  Appropriate  Insight: Appropriate  Engagement in Group:  Developing/Improving  Modes of Intervention:  Activity  Additional Comments:    Alyssa [name] 07/19/2024, 6:25 PM

## 2024-07-19 NOTE — Group Note (Signed)
 Date:  07/19/2024 Time:  6:21 PM  Group Topic/Focus:  Developing a Wellness Toolbox:   The focus of this group is to help patients develop a wellness toolbox with skills and strategies to promote recovery upon discharge.    Participation Level:  Did Not Attend

## 2024-07-19 NOTE — Progress Notes (Signed)
   07/18/24 2100  Psych Admission Type (Psych Patients Only)  Admission Status Involuntary  Psychosocial Assessment  Patient Complaints Anxiety  Eye Contact Fair  Facial Expression Worried  Affect Anxious  Speech Soft  Interaction Isolative  Motor Activity Slow  Appearance/Hygiene In scrubs  Behavior Characteristics Anxious  Mood Anxious  Thought Process  Coherency Disorganized  Content WDL  Delusions None reported or observed  Perception WDL  Hallucination None reported or observed  Judgment Impaired  Confusion Mild  Danger to Self  Current suicidal ideation? Denies

## 2024-07-19 NOTE — Plan of Care (Signed)
  Problem: Education: Goal: Ability to state activities that reduce stress will improve Outcome: Progressing   Problem: Self-Concept: Goal: Level of anxiety will decrease Outcome: Progressing   Problem: Coping: Goal: Coping ability will improve Outcome: Progressing

## 2024-07-20 DIAGNOSIS — F321 Major depressive disorder, single episode, moderate: Secondary | ICD-10-CM | POA: Diagnosis not present

## 2024-07-20 DIAGNOSIS — F418 Other specified anxiety disorders: Secondary | ICD-10-CM | POA: Diagnosis not present

## 2024-07-20 DIAGNOSIS — F19239 Other psychoactive substance dependence with withdrawal, unspecified: Secondary | ICD-10-CM

## 2024-07-20 DIAGNOSIS — F411 Generalized anxiety disorder: Secondary | ICD-10-CM | POA: Diagnosis not present

## 2024-07-20 MED ORDER — ARIPIPRAZOLE 5 MG PO TABS
5.0000 mg | ORAL_TABLET | Freq: Every day | ORAL | Status: DC
  Administered 2024-07-21 – 2024-07-22 (×4): 5 mg via ORAL
  Filled 2024-07-20 (×2): qty 1

## 2024-07-20 NOTE — Progress Notes (Addendum)
 Pt disorganized and confusion. Delayed responses.  Pt found wandering into another pt's room. Anxious, irritable and upset. Pt said I need to apologize to him, I need to apologize to him that I may have been too harsh earlier, I have to apologize! Pt redirected to the dayroom. Support and reassurance provided. Pt inially refused po medications and requires a lot of encouragement to her pills. No PRNs given. Q 15 min checks maintained for safety. Denies SI/HI/AVH.     07/19/24 2100  Psych Admission Type (Psych Patients Only)  Admission Status Involuntary  Psychosocial Assessment  Patient Complaints Anxiety;Confusion;Nervousness  Eye Contact Fair  Facial Expression Worried  Affect Anxious  Speech Soft  Interaction Minimal  Motor Activity Slow  Appearance/Hygiene In scrubs  Behavior Characteristics Fidgety;Anxious  Mood Anxious  Thought Process  Coherency Disorganized  Content Paranoia  Delusions Paranoid  Perception WDL  Hallucination None reported or observed  Judgment Impaired  Confusion Mild  Danger to Self  Current suicidal ideation? Denies

## 2024-07-20 NOTE — Group Note (Signed)
 Date:  07/20/2024 Time:  11:16 PM  Group Topic/Focus:  Wrap-Up Group:   The focus of this group is to help patients review their daily goal of treatment and discuss progress on daily workbooks.    Participation Level:  Minimal  Participation Quality:  Appropriate  Affect:  Appropriate  Cognitive:  Alert  Insight: Appropriate  Engagement in Group:  Engaged  Modes of Intervention:  Discussion  Additional Comments:    Alyssa [name] CHRISTELLA Bunker 07/20/2024, 11:16 PM

## 2024-07-20 NOTE — BH IP Treatment Plan (Signed)
 Interdisciplinary Treatment and Diagnostic Plan Update  07/20/2024 Time of Session: 3:00 PM  Alyssa [name] MRN: [id number]  Principal Diagnosis: MDD (major depressive disorder)  Secondary Diagnoses: Principal Problem:   MDD (major depressive disorder)   Current Medications:  Current Facility-Administered Medications  Medication Dose Route Frequency Provider Last Rate Last Admin   acetaminophen  (TYLENOL ) tablet 650 mg  650 mg Oral Q6H PRN Motley-Mangrum, Jadeka A, PMHNP       alum & mag hydroxide-simeth (MAALOX/MYLANTA) 200-200-20 MG/5ML suspension 30 mL  30 mL Oral Q4H PRN Motley-Mangrum, Jadeka A, PMHNP       clonazepam  (KLONOPIN ) disintegrating tablet 0.25 mg  0.25 mg Oral BID Jadapalle, Sree, MD   0.25 mg at 07/20/24 1017   haloperidol  (HALDOL ) tablet 5 mg  5 mg Oral TID PRN Motley-Mangrum, Jadeka A, PMHNP       And   diphenhydrAMINE  (BENADRYL ) capsule 50 mg  50 mg Oral TID PRN Motley-Mangrum, Jadeka A, PMHNP       haloperidol  lactate (HALDOL ) injection 10 mg  10 mg Intramuscular TID PRN Jadapalle, Sree, MD       And   diphenhydrAMINE  (BENADRYL ) injection 50 mg  50 mg Intramuscular TID PRN Jadapalle, Sree, MD       And   LORazepam  (ATIVAN ) tablet 2 mg  2 mg Oral TID PRN Jadapalle, Sree, MD       haloperidol  lactate (HALDOL ) injection 5 mg  5 mg Intramuscular TID PRN Jadapalle, Sree, MD       And   diphenhydrAMINE  (BENADRYL ) injection 50 mg  50 mg Intramuscular TID PRN Jadapalle, Sree, MD       And   LORazepam  (ATIVAN ) tablet 2 mg  2 mg Oral TID PRN Jadapalle, Sree, MD       feeding supplement (ENSURE PLUS HIGH PROTEIN) liquid 237 mL  237 mL Oral BID BM Jadapalle, Sree, MD   237 mL at 07/20/24 1034   hydrOXYzine  (ATARAX ) tablet 25 mg  25 mg Oral TID PRN Motley-Mangrum, Jadeka A, PMHNP   25 mg at 07/18/24 0046   levothyroxine  (SYNTHROID ) tablet 137 mcg  137 mcg Oral Q0600 Motley-Mangrum, Jadeka A, PMHNP   137 mcg at 07/20/24 0621   magnesium  hydroxide (MILK OF MAGNESIA) suspension  30 mL  30 mL Oral Daily PRN Motley-Mangrum, Jadeka A, PMHNP   30 mL at 07/19/24 2203   OLANZapine  zydis (ZYPREXA ) disintegrating tablet 2.5 mg  2.5 mg Oral Q8H PRN Motley-Mangrum, Jadeka A, PMHNP   2.5 mg at 07/17/24 9382   sertraline  (ZOLOFT ) tablet 50 mg  50 mg Oral Daily Jadapalle, Sree, MD   50 mg at 07/20/24 1017   traZODone  (DESYREL ) tablet 50 mg  50 mg Oral QHS PRN Motley-Mangrum, Jadeka A, PMHNP   50 mg at 07/19/24 2141   PTA Medications: Medications Prior to Admission  Medication Sig Dispense Refill Last Dose/Taking   clonazePAM  (KLONOPIN ) 0.5 MG disintegrating tablet Take 1 tablet (0.5 mg total) by mouth 2 (two) times daily. (Patient not taking: Reported on 07/13/2024) 60 tablet 0    levothyroxine  (SYNTHROID ) 137 MCG tablet Take 1 tablet (137 mcg total) by mouth daily at 6 (six) AM. 30 tablet 0    sertraline  (ZOLOFT ) 25 MG tablet Take 1 tablet (25 mg total) by mouth daily. (Patient not taking: Reported on 07/13/2024) 30 tablet 0    Travoprost, BAK Free, (TRAVATAN) 0.004 % SOLN ophthalmic solution Place 1 drop into both eyes at bedtime.       Patient Stressors:  Patient Strengths:    Treatment Modalities: Medication Management, Group therapy, Case management,  1 to 1 session with clinician, Psychoeducation, Recreational therapy.   Physician Treatment Plan for Primary Diagnosis: MDD (major depressive disorder) Long Term Goal(s): Improvement in symptoms so as ready for discharge   Short Term Goals: Ability to identify changes in lifestyle to reduce recurrence of condition will improve Ability to verbalize feelings will improve Ability to disclose and discuss suicidal ideas Ability to demonstrate self-control will improve Ability to identify and develop effective coping behaviors will improve  Medication Management: Evaluate patient's response, side effects, and tolerance of medication regimen.  Therapeutic Interventions: 1 to 1 sessions, Unit Group sessions and Medication  administration.  Evaluation of Outcomes: Not Progressing  Physician Treatment Plan for Secondary Diagnosis: Principal Problem:   MDD (major depressive disorder)  Long Term Goal(s): Improvement in symptoms so as ready for discharge   Short Term Goals: Ability to identify changes in lifestyle to reduce recurrence of condition will improve Ability to verbalize feelings will improve Ability to disclose and discuss suicidal ideas Ability to demonstrate self-control will improve Ability to identify and develop effective coping behaviors will improve     Medication Management: Evaluate patient's response, side effects, and tolerance of medication regimen.  Therapeutic Interventions: 1 to 1 sessions, Unit Group sessions and Medication administration.  Evaluation of Outcomes: Not Progressing   RN Treatment Plan for Primary Diagnosis: MDD (major depressive disorder) Long Term Goal(s): Knowledge of disease and therapeutic regimen to maintain health will improve  Short Term Goals: Ability to remain free from injury will improve, Ability to verbalize frustration and anger appropriately will improve, Ability to demonstrate self-control, Ability to participate in decision making will improve, Ability to verbalize feelings will improve, Ability to disclose and discuss suicidal ideas, Ability to identify and develop effective coping behaviors will improve, and Compliance with prescribed medications will improve  Medication Management: RN will administer medications as ordered by provider, will assess and evaluate patient's response and provide education to patient for prescribed medication. RN will report any adverse and/or side effects to prescribing provider.  Therapeutic Interventions: 1 on 1 counseling sessions, Psychoeducation, Medication administration, Evaluate responses to treatment, Monitor vital signs and CBGs as ordered, Perform/monitor CIWA, COWS, AIMS and Fall Risk screenings as ordered,  Perform wound care treatments as ordered.  Evaluation of Outcomes: Not Progressing   LCSW Treatment Plan for Primary Diagnosis: MDD (major depressive disorder) Long Term Goal(s): Safe transition to appropriate next level of care at discharge, Engage patient in therapeutic group addressing interpersonal concerns.  Short Term Goals: Engage patient in aftercare planning with referrals and resources, Increase social support, Increase ability to appropriately verbalize feelings, Increase emotional regulation, Facilitate acceptance of mental health diagnosis and concerns, Facilitate patient progression through stages of change regarding substance use diagnoses and concerns, Identify triggers associated with mental health/substance abuse issues, and Increase skills for wellness and recovery  Therapeutic Interventions: Assess for all discharge needs, 1 to 1 time with Social worker, Explore available resources and support systems, Assess for adequacy in community support network, Educate family and significant other(s) on suicide prevention, Complete Psychosocial Assessment, Interpersonal group therapy.  Evaluation of Outcomes: Not Progressing   Progress in Treatment: Attending groups: Yes. and No. Participating in groups: Yes. and No. Taking medication as prescribed: Yes. Toleration medication: Yes. Family/Significant other contact made: No, will contact:  CSW will contact if given permission  Patient understands diagnosis: Yes. Discussing patient identified problems/goals with staff: Yes. Medical problems stabilized  or resolved: Yes. Denies suicidal/homicidal ideation: Yes. Issues/concerns per patient self-inventory: No. Other: None    New problem(s) identified: No, Describe:  None identified Update 07/20/24: No changes at this time    New Short Term/Long Term Goal(s): elimination of symptoms of psychosis, medication management for mood stabilization; elimination of SI thoughts; development of  comprehensive mental wellness plan. Update 07/20/24: No changes at this time    Patient Goals:  I want to feel better Update 07/20/24: No changes at this time    Discharge Plan or Barriers: CSW will assist with appropriate discharge planning Update 07/20/24: Pt presenting with cognitive declines, unable to assess for discharge date at this time. Pt recommended to go neurology on an outpatient basis at this time per provider.    Reason for Continuation of Hospitalization: Depression Medication stabilization   Estimated Length of Stay: 1 to 7 days Update 07/20/24: TBD  Last 3 [place] Suicide Severity Risk Score: Flowsheet Row Admission (Current) from 07/14/2024 in [facility] from 07/13/2024 in [facility] Emergency Department at [facility] ED from 07/10/2024 in [facility] Emergency Department at [facility]  C-SSRS RISK CATEGORY No Risk No Risk No Risk    Last [facility] 2/9 Scores:    05/29/2024   10:26 AM 09/07/2022   11:23 AM 06/21/2021    3:28 PM  Depression screen PHQ 2/9  Decreased Interest 1 1 0  Down, Depressed, Hopeless 1 1 0  PHQ - 2 Score 2 2 0  Altered sleeping 1 1 0  Tired, decreased energy 1 1 0  Change in appetite 1 0 0  Feeling bad or failure about yourself  1 1 0  Trouble concentrating 0 1 0  Moving slowly or fidgety/restless 1 1 0  Suicidal thoughts 0 0 0  PHQ-9 Score 7 7 0  Difficult doing work/chores Somewhat difficult Somewhat difficult     Scribe for Treatment Team: Lum JONETTA Croft, [place] 07/20/2024 4:26 PM

## 2024-07-20 NOTE — Progress Notes (Signed)
 Patient present in the milieu.   Alert and oriented to person. Endorses anxiety. Fidgeting and restless, wandering around the unit attempting to go into other pt's rooms. Paranoid and suspicious of medications, but compliant with medication after encouragement from this writer.  Minimal interaction with peers.  Pt does not initiate interaction with staff.

## 2024-07-20 NOTE — Progress Notes (Signed)
 [facility] MD Progress Note  07/20/2024 11:21 PM Alyssa [name]  MRN:  [id number] Alyssa [name] is a 73 y.o. female admitted: Presented to the ED on 05/14/2024  1:26 PM for  having episodes non stop hours of pacing, fidgeting with hands, saying words that do not make sense, and paranoia. 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 She meets criteria for inpatient psychiatric admission.Patient is admitted to [facility] unit with Q15 min safety monitoring. Multidisciplinary team approach is offered. Medication management; group/milieu therapy is offered.   Subjective:  Chart reviewed, case discussed in multidisciplinary meeting, patient seen during rounds.  Patient is noted to be sitting in the day area.  She is noted to be more confused today.  She is displaying paranoid behaviors as the nurse had to spend a lot of time explaining her medications to her.  Patient continues to be confused about picking up the phone calls from her husband saying she did not know how to talk to him.  Patient has not reached out to her husband since her admission to the unit in spite of him calling the unit.  She denies SI/HI/plan.  She is not responding to stimuli and denies auditory/visual hallucinations but is clearly displaying disorganized behavior by eating from other peoples plates, walking or wandering on the unit into other patients rooms.  Discussed initiating Abilify  to help with the disorganized thought process.     Sleep: Fair  Appetite:  Fair  Past Psychiatric History: see h&P Family History:  Family  History  Problem Relation Age of Onset   Hypertension Sister    Thyroid  cancer Brother    Skin cancer Brother    Diabetes Paternal Grandmother    Colon cancer Neg Hx    Breast cancer Neg Hx    Pancreatic cancer Neg Hx    Stomach cancer Neg Hx    Liver cancer Neg Hx    Esophageal cancer Neg Hx    Social History:  Social History   Substance and Sexual Activity  Alcohol Use Yes   Comment: Rare     Social History   Substance and Sexual Activity  Drug Use Never    Social History   Socioeconomic History   Marital status: Married    Spouse name: Not on file   Number of children: 0   Years of education: Not on file   Highest education level: Master's degree (e.g., MA, MS, MEng, MEd, MSW, MBA)  Occupational History   Occupation: retired, Chief Financial Officer  Tobacco Use   Smoking status: Never   Smokeless tobacco: Never  Vaping Use   Vaping status: Never Used  Substance and Sexual Activity   Alcohol use: Yes    Comment: Rare   Drug use: Never   Sexual activity: Not Currently  Other Topics Concern   Not on file  Social History Narrative   Moved from [place] June 2022   Lives w/ husband   Moved to stay close to her sister (Mrs Andra)   Social Drivers of Health   Financial Resource Strain: Low Risk  (01/27/2023)   Received from [facility]   Overall Financial Resource Strain (CARDIA)  Difficulty of Paying Living Expenses: Not hard at all  Food Insecurity: Patient Declined (07/14/2024)   Hunger Vital Sign    Worried About Running Out of Food in the Last Year: Patient declined    Ran Out of Food in the Last Year: Patient declined  Transportation Needs: Patient Declined (07/14/2024)   PRAPARE - Administrator, Civil Service (Medical): Patient declined    Lack of Transportation (Non-Medical): Patient declined  Physical Activity: Sufficiently Active (01/27/2023)   Received from [facility]   Exercise Vital Sign    On average, how many days per week do you  engage in moderate to strenuous exercise (like a brisk walk)?: 5 days    On average, how many minutes do you engage in exercise at this level?: 30 min  Stress: No Stress Concern Present (01/27/2023)   Received from [facility] - Occupational Stress Questionnaire    Feeling of Stress : Only a little  Social Connections: Moderately Isolated (07/14/2024)   Social Connection and Isolation Panel    Frequency of Communication with Friends and Family: Three times a week    Frequency of Social Gatherings with Friends and Family: Once a week    Attends Religious Services: Never    Database administrator or Organizations: No    Attends Engineer, structural: Never    Marital Status: Married   Past Medical History:  Past Medical History:  Diagnosis Date   Anxiety and depression    Arthritis    Cervicogenic headache    Claustrophobia    Glaucoma    Hypertension    Lumbar radiculopathy    Postsurgical hypothyroidism    Spinal stenosis    Thyroid  cancer (HCC)    s/p thyroidectomy    Past Surgical History:  Procedure Laterality Date   FOOT SURGERY     LAMINOTOMY  04/03/2021   LEFT L3/4 LAMINOTOMY BILATERAL L3/4 MEDIAL FACETECTOMY   LUMBAR LAMINECTOMY  03/03/2018   THYROIDECTOMY  12/2004    Current Medications: Current Facility-Administered Medications  Medication Dose Route Frequency Provider Last Rate Last Admin   acetaminophen  (TYLENOL ) tablet 650 mg  650 mg Oral Q6H PRN Motley-Mangrum, Jadeka A, PMHNP       alum & mag hydroxide-simeth (MAALOX/MYLANTA) 200-200-20 MG/5ML suspension 30 mL  30 mL Oral Q4H PRN Motley-Mangrum, Jadeka A, PMHNP       [START ON 07/21/2024] ARIPiprazole  (ABILIFY ) tablet 5 mg  5 mg Oral Daily Aristide Waggle, MD       clonazepam  (KLONOPIN ) disintegrating tablet 0.25 mg  0.25 mg Oral BID Suliman Termini, MD   0.25 mg at 07/20/24 2124   haloperidol  (HALDOL ) tablet 5 mg  5 mg Oral TID PRN Motley-Mangrum, Jadeka A,  PMHNP       And   diphenhydrAMINE  (BENADRYL ) capsule 50 mg  50 mg Oral TID PRN Motley-Mangrum, Jadeka A, PMHNP       haloperidol  lactate (HALDOL ) injection 10 mg  10 mg Intramuscular TID PRN Norrine Ballester, MD       And   diphenhydrAMINE  (BENADRYL ) injection 50 mg  50 mg Intramuscular TID PRN Salisha Bardsley, MD       And   LORazepam  (ATIVAN ) tablet 2 mg  2 mg Oral TID PRN Kiante Ciavarella, MD       haloperidol  lactate (HALDOL ) injection 5 mg  5 mg Intramuscular TID PRN Jahmel Flannagan, MD       And   diphenhydrAMINE  (BENADRYL )  injection 50 mg  50 mg Intramuscular TID PRN Kaliopi Blyden, MD       And   LORazepam  (ATIVAN ) tablet 2 mg  2 mg Oral TID PRN Rhen Dossantos, MD       feeding supplement (ENSURE PLUS HIGH PROTEIN) liquid 237 mL  237 mL Oral BID BM Elyanah Farino, MD   237 mL at 07/20/24 1034   hydrOXYzine  (ATARAX ) tablet 25 mg  25 mg Oral TID PRN Motley-Mangrum, Jadeka A, PMHNP   25 mg at 07/18/24 0046   levothyroxine  (SYNTHROID ) tablet 137 mcg  137 mcg Oral Q0600 Motley-Mangrum, Jadeka A, PMHNP   137 mcg at 07/20/24 0621   magnesium  hydroxide (MILK OF MAGNESIA) suspension 30 mL  30 mL Oral Daily PRN Motley-Mangrum, Jadeka A, PMHNP   30 mL at 07/19/24 2203   OLANZapine  zydis (ZYPREXA ) disintegrating tablet 2.5 mg  2.5 mg Oral Q8H PRN Motley-Mangrum, Jadeka A, PMHNP   2.5 mg at 07/17/24 0617   sertraline  (ZOLOFT ) tablet 50 mg  50 mg Oral Daily Roan Miklos, MD   50 mg at 07/20/24 1017   traZODone  (DESYREL ) tablet 50 mg  50 mg Oral QHS PRN Motley-Mangrum, Jadeka A, PMHNP   50 mg at 07/20/24 2124    Lab Results: No results found for this or any previous visit (from the past 48 hours).  Blood Alcohol level:  Lab Results  Component Value Date   [facility] <15 07/13/2024   ETH <15 05/14/2024    Metabolic Disorder Labs: Lab Results  Component Value Date   HGBA1C 5.1 06/07/2022   MPG 100 06/07/2022   No results found for: PROLACTIN Lab Results  Component Value Date   CHOL 309  (H) 06/07/2022   TRIG 62 06/07/2022   HDL 75 06/07/2022   CHOLHDL 4.1 06/07/2022   VLDL 12 06/07/2022   LDLCALC 222 (H) 06/07/2022   LDLCALC 125 (H) 11/27/2021    Physical Findings: AIMS:  , ,  ,  ,    CIWA:    COWS:      Psychiatric Specialty Exam:  Presentation  General Appearance:  Appropriate for Environment; Casual  Eye Contact: Fair  Speech: Slow  Speech Volume: Normal    Mood and Affect  Mood: Anxious; Depressed  Affect: Depressed; Flat   Thought Process  Thought Processes: Irrevelant  Descriptions of Associations:Intact  Orientation:Partial  Thought Content:Illogical  Hallucinations: Denies  Ideas of Reference:Paranoia  Suicidal Thoughts: Denies Homicidal Thoughts: Denies   Sensorium  Memory: Immediate Fair; Recent Fair; Remote Poor  Judgment: Impaired  Insight: Shallow   Executive Functions  Concentration: Poor  Attention Span: Poor  Recall: Poor  Fund of Knowledge: Fair  Language: Fair   Psychomotor Activity  Psychomotor Activity: No data recorded  Musculoskeletal: Strength & Muscle Tone: within normal limits Gait & Station: normal Assets  Assets: Manufacturing systems engineer; Desire for Improvement; Resilience; Social Support    Physical Exam: Physical Exam Vitals and nursing note reviewed.  HENT:     Head: Normocephalic.     Nose: Nose normal.  Cardiovascular:     Rate and Rhythm: Normal rate.  Pulmonary:     Effort: Pulmonary effort is normal.  Neurological:     Mental Status: She is alert.    Review of Systems  Constitutional: Negative.   HENT: Negative.    Eyes: Negative.   Cardiovascular: Negative.   Skin: Negative.    Blood pressure 124/66, pulse 65, temperature 97.9 F (36.6 C), resp. rate 14, height 5' 8 (1.727  m), weight 66 kg, SpO2 99%. Body mass index is 22.12 kg/m.  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  Patient has stopped all her medications and has not followed up with any outpatient psychiatry.  Patient needs close monitoring and medication management during this inpatient hospitalization.   Treatment Plan Summary:   Safety and Monitoring:             -- Voluntary admission to inpatient psychiatric unit for safety, stabilization and treatment             -- Daily contact with patient to assess and evaluate symptoms and progress in treatment             -- Patient's case to be discussed in multi-disciplinary team meeting             -- Observation Level: q15 minute checks             -- Vital signs:  q12 hours             -- Precautions: suicide, elopement, and assault   2. Psychiatric Diagnoses and Treatment:  Increased Klonopin  0.25 mg BID, Zoloft  50 mg daily Will consider adding Abilify  to help with paranoia   -- The risks/benefits/side-effects/alternatives to this medication were discussed in detail with the patient and time was given for questions. The patient consents to medication trial.                -- Metabolic profile and EKG monitoring obtained while on an atypical antipsychotic (BMI: Lipid Panel: HbgA1c: QTc:)              -- Encouraged patient to participate in unit milieu and in scheduled group therapies   4. Discharge Planning:   -- Social work and case management to assist with discharge planning and identification of hospital follow-up needs prior to discharge  -- Estimated LOS: 3-4 days  Allyn Foil, MD 07/20/2024, 11:21 PM

## 2024-07-20 NOTE — Group Note (Signed)
 Date:  07/20/2024 Time:  12:53 AM  Group Topic/Focus:  Wrap-Up Group:   The focus of this group is to help patients review their daily goal of treatment and discuss progress on daily workbooks.    Participation Level:  Minimal  Participation Quality:  Attentive  Affect:  Appropriate  Cognitive:  Oriented  Insight: Limited  Engagement in Group:  Limited  Modes of Intervention:  Discussion  Additional Comments:    Alyssa [name] 07/20/2024, 12:53 AM

## 2024-07-20 NOTE — Progress Notes (Signed)
   07/20/24 0612  15 Minute Checks  Location Bedroom  Visual Appearance Calm  Behavior Sleeping  OTHER  Documented sleep last 24 hours 6.25  Sleep (Behavioral Health Patients Only)  Calculate sleep? (Click Yes once per 24 hr at 0600 safety check) Yes

## 2024-07-20 NOTE — Plan of Care (Signed)

## 2024-07-20 NOTE — Group Note (Signed)
 Date:  07/20/2024 Time:  12:45 PM  Group Topic/Focus:  Wellness Toolbox:   The focus of this group is to discuss various aspects of wellness, balancing those aspects and exploring ways to increase the ability to experience wellness.  Patients will create a wellness toolbox for use upon discharge.    Participation Level:  Active  Participation Quality:  Appropriate  Affect:  Appropriate  Cognitive:  Appropriate  Insight: Appropriate  Engagement in Group:  Engaged  Modes of Intervention:  Activity  Additional Comments:    Fawaz Borquez 07/20/2024, 12:45 PM

## 2024-07-20 NOTE — Plan of Care (Signed)
  Problem: Education: Goal: Ability to state activities that reduce stress will improve Outcome: Progressing   Problem: Coping: Goal: Ability to identify and develop effective coping behavior will improve Outcome: Progressing   Problem: Self-Concept: Goal: Ability to identify factors that promote anxiety will improve Outcome: Progressing Goal: Level of anxiety will decrease Outcome: Progressing Goal: Ability to modify response to factors that promote anxiety will improve Outcome: Progressing   Problem: Education: Goal: Utilization of techniques to improve thought processes will improve Outcome: Progressing Goal: Knowledge of the prescribed therapeutic regimen will improve Outcome: Progressing   Problem: Activity: Goal: Interest or engagement in leisure activities will improve Outcome: Progressing Goal: Imbalance in normal sleep/wake cycle will improve Outcome: Progressing   Problem: Coping: Goal: Coping ability will improve Outcome: Progressing Goal: Will verbalize feelings Outcome: Progressing   Problem: Health Behavior/Discharge Planning: Goal: Ability to make decisions will improve Outcome: Progressing Goal: Compliance with therapeutic regimen will improve Outcome: Progressing   Problem: Role Relationship: Goal: Will demonstrate positive changes in social behaviors and relationships Outcome: Progressing   Problem: Safety: Goal: Ability to disclose and discuss suicidal ideas will improve Outcome: Progressing Goal: Ability to identify and utilize support systems that promote safety will improve Outcome: Progressing   Problem: Self-Concept: Goal: Will verbalize positive feelings about self Outcome: Progressing Goal: Level of anxiety will decrease Outcome: Progressing   Problem: Education: Goal: Ability to make informed decisions regarding treatment will improve Outcome: Progressing   Problem: Coping: Goal: Coping ability will improve Outcome:  Progressing   Problem: Health Behavior/Discharge Planning: Goal: Identification of resources available to assist in meeting health care needs will improve Outcome: Progressing   Problem: Medication: Goal: Compliance with prescribed medication regimen will improve Outcome: Progressing   Problem: Self-Concept: Goal: Ability to disclose and discuss suicidal ideas will improve Outcome: Progressing Goal: Will verbalize positive feelings about self Outcome: Progressing Note: Patient is on track. Patient will work on increased adherence, avoid flare triggers, and be monitored by provider to determine if a change in treatment plan is warranted

## 2024-07-20 NOTE — Group Note (Signed)
 Recreation Therapy Group Note   Group Topic:Coping Skills  Group Date: 07/20/2024 Start Time: 1350 End Time: 1450 Facilitators: Celestia Jeoffrey BRAVO, LRT, CTRS Location: Courtyard  Group Description: Music. Patients encouraged to name their favorite song(s) for LRT to play song through speaker for group to hear, while in the courtyard getting fresh air and sunlight. Patients educated on the definition of leisure and the importance of having different leisure interests outside of the hospital. Group discussed how leisure activities can often be used as Pharmacologist and that listening to music and being outside are examples.    Goal Area(s) Addressed:  Patient will identify a current leisure interest.  Patient will practice making a positive decision. Patient will have the opportunity to try a new leisure activity.   Affect/Mood: Flat   Participation Level: Non-verbal    Clinical Observations/Individualized Feedback: Alyssa [name] was present in group. Pt did not interact with LRT or peers while present. Pt appeared preoccupied with her thoughts.   Plan: Continue to engage patient in RT group sessions 2-3x/week.   Jeoffrey BRAVO Celestia, LRT, CTRS 07/20/2024 4:53 PM

## 2024-07-20 NOTE — Plan of Care (Signed)
  Problem: Self-Concept: Goal: Level of anxiety will decrease Outcome: Not Progressing   Problem: Safety: Goal: Ability to identify and utilize support systems that promote safety will improve Outcome: Not Progressing   Problem: Self-Concept: Goal: Level of anxiety will decrease Outcome: Not Progressing   Problem: Education: Goal: Ability to make informed decisions regarding treatment will improve Outcome: Not Progressing

## 2024-07-21 DIAGNOSIS — F411 Generalized anxiety disorder: Secondary | ICD-10-CM | POA: Diagnosis not present

## 2024-07-21 DIAGNOSIS — F321 Major depressive disorder, single episode, moderate: Secondary | ICD-10-CM | POA: Diagnosis not present

## 2024-07-21 DIAGNOSIS — F19239 Other psychoactive substance dependence with withdrawal, unspecified: Secondary | ICD-10-CM | POA: Diagnosis not present

## 2024-07-21 DIAGNOSIS — F418 Other specified anxiety disorders: Secondary | ICD-10-CM | POA: Diagnosis not present

## 2024-07-21 NOTE — Group Note (Signed)
 Recreation Therapy Group Note   Group Topic:Other  Group Date: 07/21/2024 Start Time: 1400 End Time: 1500 Facilitators: Celestia Jeoffrey BRAVO, LRT, CTRS Location: Courtyard  Group Description: Outdoor Recreation. Patients had the option to play corn hole, ring toss, bowling or listening to music while outside in the courtyard getting fresh air and sunlight. Patients helped water  and prune the raised garden beds. LRT and patients discussed things that they enjoy doing in their free time outside of the hospital. LRT encouraged patients to drink water  after being active and getting their heart rate up.   Goal Area(s) Addressed: Patient will identify leisure interests.  Patient will practice healthy decision making. Patient will engage in recreation activity.   Affect/Mood: Anxious and Flat   Participation Level: Non-verbal    Clinical Observations/Individualized Feedback: Alyssa [name] came late to group. Pt did not interact with LRT or peers despite prompting.   Plan: Continue to engage patient in RT group sessions 2-3x/week.   Jeoffrey BRAVO Celestia, LRT, CTRS 07/21/2024 4:37 PM

## 2024-07-21 NOTE — Group Note (Signed)
 Physical/Occupational Therapy Group Note  Group Topic: Functional, Dynamic Balance   Group Date: 07/21/2024 Start Time: 1530 End Time: 1600 Facilitators: Ashkan Chamberland, Alm Hamilton, PT   Group Description: Group discussed impact of balance on safety and independence with functional tasks.  Identified and discussed any self-perceived balance deficits to personalize information.  Discussed and reviewed strategies to address/improve balance deficits: use of assist devices, activity pacing/energy conservation, environment/home safety modifications, focusing attention/minimizing distraction.  Reviewed and participated with standing LE therex designed to target dynamic balance reactions and LE strength/stability; provided handouts with HEP to be utilized outside of group time as appropriate.  Allowed time for questions and further discussion on any balance or mobility concerns/needs.  Therapeutic Goal(s):  Identify and discuss any individual balance deficits and functional implications. Identify and discuss any environmental/home safety modifications that can optimize balance and safety for mobility within the home. Demonstrate understanding and performance of standing therex designed to target dynamic balance deficits.  Individual Participation: Pt mostly quietly attentive with only minimal participation with the physical activity portion of the session and non-participatory in the discussion component.  Pt steady during the standing activity that she participated in with no overt LOB.   Participation Level: Minimal   Participation Quality: Minimal Cues   Behavior: Calm and Passive   Speech/Thought Process: Non-verbal during the session    Affect/Mood: Flat   Insight: Difficult to assess   Judgement: Difficult to assess   Modes of Intervention: Activity, Discussion, and Education  Patient Response to Interventions:  Attentive   Plan: Continue to engage patient in PT/OT groups 1 - 2x/week.  CHARM Hamilton Bertin PT, DPT 07/21/24, 4:37 PM

## 2024-07-21 NOTE — Progress Notes (Addendum)
   07/20/24 2030  Psych Admission Type (Psych Patients Only)  Admission Status Involuntary  Psychosocial Assessment  Patient Complaints Anxiety;Confusion;Helplessness;Worrying;Irritability  Eye Contact Staring;Brief  Facial Expression Anxious;Blank  Affect Anxious;Irritable  Speech Soft  Interaction Cautious;Minimal;Intrusive;Guarded  Motor Activity Fidgety;Hand-wringing  Appearance/Hygiene In scrubs  Behavior Characteristics Pacing;Irritable;Guarded;Fidgety;Intrusive;Anxious  Mood Anxious  Thought Process  Coherency Disorganized  Content Paranoia  Delusions Paranoid  Perception WDL  Hallucination None reported or observed  Judgment Impaired  Confusion Moderate  Danger to Self  Current suicidal ideation? Denies  Danger to Others  Danger to Others None reported or observed

## 2024-07-21 NOTE — Group Note (Signed)
 Date:  07/21/2024 Time:  9:07 PM  Group Topic/Focus:  Wrap-Up Group:   The focus of this group is to help patients review their daily goal of treatment and discuss progress on daily workbooks.    Participation Level:  Did Not Attend  Participation Quality:     Affect:     Cognitive:     Insight: None  Engagement in Group:  None  Modes of Intervention:     Additional Comments:    Alyssa [name] 07/21/2024, 9:07 PM

## 2024-07-21 NOTE — Progress Notes (Signed)
   07/21/24 1238  Spiritual Encounters  Type of Visit Follow up  Care provided to: Patient  Referral source Chaplain assessment  Reason for visit Routine spiritual support  OnCall Visit No   I met with [name] in the dayroom to offer follow-up support.  [name] welcomed my visit. She expressed readiness for discharge but currently does not know plan. She spoke about her Alyssa [name] faith and sense that Alyssa [name] is present with her and that this is a source of hope.  I provided compassionate, non-anxious presence. I invited Alyssa [name] to share more about ways that she can engage her faith. She recalled a time a teacher had encouraged writing Consolations and Desolations to rememebr times G*d's presence was felt. I invited her to share more about this. I also explored more about relationships, including with her siblings (relationship with sister has ups and downs but unclear how; brother lives out of state and other brother has passed). I brought up possibilities for making new connections--in hospital and outside, possibly a faith community. We spoke about meaningful conversations here and relational support available.  I offered prayer at [facility] request and left a New Testament. Will continue to follow.  Alyssa [name] L. Alyssa [name], M.Div [phone]

## 2024-07-21 NOTE — Progress Notes (Signed)
 Patient is pleasant.  Alert and oriented to person. Appears anxious, fidgeting and restless.  Denies SI/HI and AVH.  Denies pain.  Compliant with medications with strong encouragement from RN.  15 min checks in place for safety.  Patient is present in the milieu.  Intrusive with peers at time. Needs frequent re-direction to stay out of other patient's doorways/rooms.

## 2024-07-21 NOTE — Progress Notes (Signed)
   07/21/24 2000  Psych Admission Type (Psych Patients Only)  Admission Status Involuntary  Psychosocial Assessment  Patient Complaints Anxiety;Confusion;Nervousness;Worrying  Eye Contact Fair  Facial Expression Anxious  Affect Anxious  Speech Soft  Interaction Cautious  Motor Activity Restless  Appearance/Hygiene In scrubs  Behavior Characteristics Anxious;Guarded  Mood Anxious  Thought Process  Coherency Disorganized  Content Preoccupation  Delusions Paranoid  Perception WDL  Hallucination None reported or observed  Judgment Impaired  Confusion Moderate  Danger to Self  Current suicidal ideation? Denies  Danger to Others  Danger to Others None reported or observed

## 2024-07-21 NOTE — Plan of Care (Signed)
  Problem: Education: Goal: Ability to state activities that reduce stress will improve Outcome: Progressing   Problem: Coping: Goal: Ability to identify and develop effective coping behavior will improve Outcome: Progressing   Problem: Self-Concept: Goal: Ability to identify factors that promote anxiety will improve Outcome: Progressing Goal: Level of anxiety will decrease Outcome: Progressing Goal: Ability to modify response to factors that promote anxiety will improve Outcome: Progressing   Problem: Education: Goal: Utilization of techniques to improve thought processes will improve Outcome: Progressing Goal: Knowledge of the prescribed therapeutic regimen will improve Outcome: Progressing   Problem: Activity: Goal: Interest or engagement in leisure activities will improve Outcome: Progressing Goal: Imbalance in normal sleep/wake cycle will improve Outcome: Progressing   Problem: Coping: Goal: Coping ability will improve Outcome: Progressing Goal: Will verbalize feelings Outcome: Progressing   Problem: Health Behavior/Discharge Planning: Goal: Ability to make decisions will improve Outcome: Progressing Goal: Compliance with therapeutic regimen will improve Outcome: Progressing   Problem: Role Relationship: Goal: Will demonstrate positive changes in social behaviors and relationships Outcome: Progressing   Problem: Safety: Goal: Ability to disclose and discuss suicidal ideas will improve Outcome: Progressing Goal: Ability to identify and utilize support systems that promote safety will improve Outcome: Progressing   Problem: Self-Concept: Goal: Will verbalize positive feelings about self Outcome: Progressing Goal: Level of anxiety will decrease Outcome: Progressing   Problem: Education: Goal: Ability to make informed decisions regarding treatment will improve Outcome: Progressing   Problem: Coping: Goal: Coping ability will improve Outcome:  Progressing   Problem: Health Behavior/Discharge Planning: Goal: Identification of resources available to assist in meeting health care needs will improve Outcome: Progressing   Problem: Medication: Goal: Compliance with prescribed medication regimen will improve Outcome: Progressing   Problem: Self-Concept: Goal: Ability to disclose and discuss suicidal ideas will improve Outcome: Progressing Goal: Will verbalize positive feelings about self Outcome: Progressing Note: Patient is on track. Patient will work on increased adherence, avoid flare triggers, and be monitored by provider to determine if a change in treatment plan is warranted

## 2024-07-21 NOTE — Plan of Care (Signed)
  Problem: Self-Concept: Goal: Level of anxiety will decrease Outcome: Not Progressing   Problem: Education: Goal: Knowledge of the prescribed therapeutic regimen will improve Outcome: Not Progressing   Problem: Coping: Goal: Coping ability will improve Outcome: Not Progressing

## 2024-07-21 NOTE — Group Note (Signed)
 Date:  07/21/2024 Time:  11:03 AM  Group Topic/Focus:  Gratitude Group The purpose of this group is a exercise that goes over things they are grateful discussing some facts about their lives and how can they improve their positive thinking.    Participation Level:  Active  Participation Quality:  Appropriate  Affect:  Appropriate  Cognitive:  Appropriate  Insight: Appropriate  Engagement in Group:  Engaged  Modes of Intervention:  Activity and Discussion  Additional Comments:    Alyssa [name] 07/21/2024, 11:03 AM

## 2024-07-21 NOTE — Progress Notes (Signed)
 [facility] MD Progress Note  07/21/2024 1:07 PM Alyssa [name]  MRN:  [id number] Alyssa [name] is a 73 y.o. female admitted: Presented to the ED on 05/14/2024  1:26 PM for  having episodes non stop hours of pacing, fidgeting with hands, saying words that do not make sense, and paranoia. 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 She meets criteria for inpatient psychiatric admission.Patient is admitted to [facility] unit with Q15 min safety monitoring. Multidisciplinary team approach is offered. Medication management; group/milieu therapy is offered.   Subjective:  Chart reviewed, case discussed in multidisciplinary meeting, patient seen during rounds.  On interview patient reports that she wants to go home to her sister.  When provider asked why she does not want to go home with her husband she reports that she has not been able to reach out to him.  She then informs that she did not call him since her admission and also states that she has not heard from him calling for her on the unit.  Provider updated her that he did call over the weekend which patient was aware.  Patient was encouraged to call her husband and ask them to visit her so that family can assess her baseline.  Patient denies auditory/visual hallucinations.  Patient denies SI/HI/plan.  Patient needs a lot of encouragement and coaching to take her medications.  Per nursing patient continues to display confusion at nights.    Sleep: Fair  Appetite:  Fair  Past Psychiatric History: see h&P Family History:  Family History  Problem  Relation Age of Onset   Hypertension Sister    Thyroid  cancer Brother    Skin cancer Brother    Diabetes Paternal Grandmother    Colon cancer Neg Hx    Breast cancer Neg Hx    Pancreatic cancer Neg Hx    Stomach cancer Neg Hx    Liver cancer Neg Hx    Esophageal cancer Neg Hx    Social History:  Social History   Substance and Sexual Activity  Alcohol Use Yes   Comment: Rare     Social History   Substance and Sexual Activity  Drug Use Never    Social History   Socioeconomic History   Marital status: Married    Spouse name: Not on file   Number of children: 0   Years of education: Not on file   Highest education level: Master's degree (e.g., MA, MS, MEng, MEd, MSW, MBA)  Occupational History   Occupation: retired, Chief Financial Officer  Tobacco Use   Smoking status: Never   Smokeless tobacco: Never  Vaping Use   Vaping status: Never Used  Substance and Sexual Activity   Alcohol use: Yes    Comment: Rare   Drug use: Never   Sexual activity: Not Currently  Other Topics Concern   Not on file  Social History Narrative   Moved from [place] June 2022   Lives w/ husband   Moved to stay close to her sister (Mrs Andra)   Social Drivers of Health   Financial Resource Strain: Low Risk  (01/27/2023)   Received from [facility]   Overall Financial Resource Strain (CARDIA)  Difficulty of Paying Living Expenses: Not hard at all  Food Insecurity: Patient Declined (07/14/2024)   Hunger Vital Sign    Worried About Running Out of Food in the Last Year: Patient declined    Ran Out of Food in the Last Year: Patient declined  Transportation Needs: Patient Declined (07/14/2024)   PRAPARE - Administrator, Civil Service (Medical): Patient declined    Lack of Transportation (Non-Medical): Patient declined  Physical Activity: Sufficiently Active (01/27/2023)   Received from [facility]   Exercise Vital Sign    On average, how many days per week do you engage in moderate to  strenuous exercise (like a brisk walk)?: 5 days    On average, how many minutes do you engage in exercise at this level?: 30 min  Stress: No Stress Concern Present (01/27/2023)   Received from [facility] - Occupational Stress Questionnaire    Feeling of Stress : Only a little  Social Connections: Moderately Isolated (07/14/2024)   Social Connection and Isolation Panel    Frequency of Communication with Friends and Family: Three times a week    Frequency of Social Gatherings with Friends and Family: Once a week    Attends Religious Services: Never    Database administrator or Organizations: No    Attends Engineer, structural: Never    Marital Status: Married   Past Medical History:  Past Medical History:  Diagnosis Date   Anxiety and depression    Arthritis    Cervicogenic headache    Claustrophobia    Glaucoma    Hypertension    Lumbar radiculopathy    Postsurgical hypothyroidism    Spinal stenosis    Thyroid  cancer (HCC)    s/p thyroidectomy    Past Surgical History:  Procedure Laterality Date   FOOT SURGERY     LAMINOTOMY  04/03/2021   LEFT L3/4 LAMINOTOMY BILATERAL L3/4 MEDIAL FACETECTOMY   LUMBAR LAMINECTOMY  03/03/2018   THYROIDECTOMY  12/2004    Current Medications: Current Facility-Administered Medications  Medication Dose Route Frequency Provider Last Rate Last Admin   acetaminophen  (TYLENOL ) tablet 650 mg  650 mg Oral Q6H PRN Motley-Mangrum, Jadeka A, PMHNP       alum & mag hydroxide-simeth (MAALOX/MYLANTA) 200-200-20 MG/5ML suspension 30 mL  30 mL Oral Q4H PRN Motley-Mangrum, Jadeka A, PMHNP       ARIPiprazole  (ABILIFY ) tablet 5 mg  5 mg Oral Daily Yaniah Thiemann, MD   5 mg at 07/21/24 0915   clonazepam  (KLONOPIN ) disintegrating tablet 0.25 mg  0.25 mg Oral BID Andrey Mccaskill, MD   0.25 mg at 07/21/24 0915   haloperidol  (HALDOL ) tablet 5 mg  5 mg Oral TID PRN Motley-Mangrum, Jadeka A, PMHNP       And    diphenhydrAMINE  (BENADRYL ) capsule 50 mg  50 mg Oral TID PRN Motley-Mangrum, Jadeka A, PMHNP       haloperidol  lactate (HALDOL ) injection 10 mg  10 mg Intramuscular TID PRN Paighton Godette, MD       And   diphenhydrAMINE  (BENADRYL ) injection 50 mg  50 mg Intramuscular TID PRN Terriona Horlacher, MD       And   LORazepam  (ATIVAN ) tablet 2 mg  2 mg Oral TID PRN Benjaman Artman, MD       haloperidol  lactate (HALDOL ) injection 5 mg  5 mg Intramuscular TID PRN Heydi Swango, MD       And   diphenhydrAMINE  (BENADRYL )  injection 50 mg  50 mg Intramuscular TID PRN Kemyra August, MD       And   LORazepam  (ATIVAN ) tablet 2 mg  2 mg Oral TID PRN Dacian Orrico, MD       feeding supplement (ENSURE PLUS HIGH PROTEIN) liquid 237 mL  237 mL Oral BID BM Kaci Freel, MD   237 mL at 07/20/24 1034   hydrOXYzine  (ATARAX ) tablet 25 mg  25 mg Oral TID PRN Motley-Mangrum, Jadeka A, PMHNP   25 mg at 07/18/24 0046   levothyroxine  (SYNTHROID ) tablet 137 mcg  137 mcg Oral Q0600 Motley-Mangrum, Jadeka A, PMHNP   137 mcg at 07/21/24 9357   magnesium  hydroxide (MILK OF MAGNESIA) suspension 30 mL  30 mL Oral Daily PRN Motley-Mangrum, Jadeka A, PMHNP   30 mL at 07/19/24 2203   OLANZapine  zydis (ZYPREXA ) disintegrating tablet 2.5 mg  2.5 mg Oral Q8H PRN Motley-Mangrum, Jadeka A, PMHNP   2.5 mg at 07/17/24 0617   sertraline  (ZOLOFT ) tablet 50 mg  50 mg Oral Daily Mark Hassey, MD   50 mg at 07/21/24 0915   traZODone  (DESYREL ) tablet 50 mg  50 mg Oral QHS PRN Motley-Mangrum, Jadeka A, PMHNP   50 mg at 07/20/24 2124    Lab Results: No results found for this or any previous visit (from the past 48 hours).  Blood Alcohol level:  Lab Results  Component Value Date   [facility] <15 07/13/2024   ETH <15 05/14/2024    Metabolic Disorder Labs: Lab Results  Component Value Date   HGBA1C 5.1 06/07/2022   MPG 100 06/07/2022   No results found for: PROLACTIN Lab Results  Component Value Date   CHOL 309 (H) 06/07/2022    TRIG 62 06/07/2022   HDL 75 06/07/2022   CHOLHDL 4.1 06/07/2022   VLDL 12 06/07/2022   LDLCALC 222 (H) 06/07/2022   LDLCALC 125 (H) 11/27/2021    Physical Findings: AIMS:  , ,  ,  ,    CIWA:    COWS:      Psychiatric Specialty Exam:  Presentation  General Appearance:  Appropriate for Environment; Casual  Eye Contact: Fair  Speech: Slow  Speech Volume: Normal    Mood and Affect  Mood: Anxious; Depressed  Affect: Depressed; Flat   Thought Process  Thought Processes: Irrevelant  Descriptions of Associations:Intact  Orientation:Partial  Thought Content:Illogical  Hallucinations: Denies  Ideas of Reference:Paranoia  Suicidal Thoughts: Denies Homicidal Thoughts: Denies   Sensorium  Memory: Immediate Fair; Recent Fair; Remote Poor  Judgment: Impaired  Insight: Shallow   Executive Functions  Concentration: Poor  Attention Span: Poor  Recall: Poor  Fund of Knowledge: Fair  Language: Fair   Psychomotor Activity  Psychomotor Activity: No data recorded  Musculoskeletal: Strength & Muscle Tone: within normal limits Gait & Station: normal Assets  Assets: Manufacturing systems engineer; Desire for Improvement; Resilience; Social Support    Physical Exam: Physical Exam Vitals and nursing note reviewed.  HENT:     Head: Normocephalic.     Nose: Nose normal.  Cardiovascular:     Rate and Rhythm: Normal rate.  Pulmonary:     Effort: Pulmonary effort is normal.  Neurological:     Mental Status: She is alert.    Review of Systems  Constitutional: Negative.   HENT: Negative.    Eyes: Negative.   Cardiovascular: Negative.   Skin: Negative.    Blood pressure 131/78, pulse 72, temperature (!) 97.5 F (36.4 C), resp. rate 18, height 5' 8 (  1.727 m), weight 66 kg, SpO2 97%. Body mass index is 22.12 kg/m.  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  Patient has stopped all her medications and has not followed up with any outpatient psychiatry.  Patient needs close monitoring and medication management during this inpatient hospitalization.   Treatment Plan Summary:   Safety and Monitoring:             -- Voluntary admission to inpatient psychiatric unit for safety, stabilization and treatment             -- Daily contact with patient to assess and evaluate symptoms and progress in treatment             -- Patient's case to be discussed in multi-disciplinary team meeting             -- Observation Level: q15 minute checks             -- Vital signs:  q12 hours             -- Precautions: suicide, elopement, and assault   2. Psychiatric Diagnoses and Treatment:  Increased Klonopin  0.25 mg BID, Zoloft  50 mg daily Will consider adding Abilify  to help with paranoia   -- The risks/benefits/side-effects/alternatives to this medication were discussed in detail with the patient and time was given for questions. The patient consents to medication trial.                -- Metabolic profile and EKG monitoring obtained while on an atypical antipsychotic (BMI: Lipid Panel: HbgA1c: QTc:)              -- Encouraged patient to participate in unit milieu and in scheduled group therapies   4. Discharge Planning:   -- Social work and case management to assist with discharge planning and identification of hospital follow-up needs prior to discharge  -- Estimated LOS: 3-4 days  Allyn Foil, MD 07/21/2024, 1:07 PM

## 2024-07-22 DIAGNOSIS — F321 Major depressive disorder, single episode, moderate: Secondary | ICD-10-CM | POA: Diagnosis not present

## 2024-07-22 DIAGNOSIS — F19239 Other psychoactive substance dependence with withdrawal, unspecified: Secondary | ICD-10-CM | POA: Diagnosis not present

## 2024-07-22 DIAGNOSIS — F418 Other specified anxiety disorders: Secondary | ICD-10-CM | POA: Diagnosis not present

## 2024-07-22 DIAGNOSIS — F411 Generalized anxiety disorder: Secondary | ICD-10-CM | POA: Diagnosis not present

## 2024-07-22 MED ORDER — CLONAZEPAM 0.25 MG PO TBDP
0.5000 mg | ORAL_TABLET | Freq: Two times a day (BID) | ORAL | Status: DC
  Administered 2024-07-23: 0.5 mg via ORAL
  Filled 2024-07-22 (×2): qty 4

## 2024-07-22 MED ORDER — ARIPIPRAZOLE 5 MG PO TABS
7.5000 mg | ORAL_TABLET | Freq: Every day | ORAL | Status: DC
  Administered 2024-07-23 – 2024-07-27 (×5): 7.5 mg via ORAL
  Filled 2024-07-22 (×5): qty 2

## 2024-07-22 NOTE — Group Note (Signed)
 Date:  07/22/2024 Time:  9:29 PM  Group Topic/Focus:  Identifying Needs:   The focus of this group is to help patients identify their personal needs that have been historically problematic and identify healthy behaviors to address their needs.    Participation Level:  Active  Participation Quality:  Appropriate  Affect:  Appropriate  Cognitive:  Appropriate  Insight: Appropriate  Engagement in Group:  Engaged  Modes of Intervention:  Exploration  Additional Comments:    Alyssa [name] 07/22/2024, 9:29 PM

## 2024-07-22 NOTE — Group Note (Signed)
 Date:  07/22/2024 Time:  2:07 PM  Group Topic/Focus:  Managing Feelings:   The focus of this group is to identify what feelings patients have difficulty handling and develop a plan to handle them in a healthier way upon discharge.    Participation Level:  Did Not Attend   Alyssa [name] 07/22/2024, 2:07 PM

## 2024-07-22 NOTE — [facility] Counselor (Signed)
 CSW contacted pt's husband, Chenika Nevils, [phone] with provider present.   Provider gave updates to pt's spouse regarding her medications. Provider recommended that pt follow-up with neurologist for further work-up.   Pt's husband agreeable to d/c either Friday or Saturday with appropriate follow-up.   Lum Croft, MSW, [place] 07/22/2024 3:37 PM

## 2024-07-22 NOTE — Progress Notes (Signed)
 [facility] MD Progress Note  07/22/2024 10:00 PM Alyssa [name]  MRN:  [id number] Alyssa [name] is a 73 y.o. female admitted: Presented to the ED on 05/14/2024  1:26 PM for  having episodes non stop hours of pacing, fidgeting with hands, saying words that do not make sense, and paranoia. 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 She meets criteria for inpatient psychiatric admission.Patient is admitted to [facility] unit with Q15 min safety monitoring. Multidisciplinary team approach is offered. Medication management; group/milieu therapy is offered.   Subjective:  Chart reviewed, case discussed in multidisciplinary meeting, patient seen during rounds.  Patient is noted to be more confused today.  She unable to describe her mood but denies anxiety panic attacks and denies SI/HI/plan and denies hallucinations.  She is noted to display paranoia by asking what is going on with her and placing the hallway, trying to look around the corner of her room.  She reports that she needs to sit in the day area and not in her room.  Per nursing staff patient is walking into other patient's room and having poor boundaries, eating from other patients plate saying her food smells.  Provider and social worker called patient's husband and updated on patient's intermittent confusion and the need for neurology workup as outpatient at.   Sleep: Fair  Appetite:  Fair  Past Psychiatric History: see h&P Family History:  Family History  Problem Relation Age of Onset   Hypertension Sister    Thyroid  cancer Brother     Skin cancer Brother    Diabetes Paternal Grandmother    Colon cancer Neg Hx    Breast cancer Neg Hx    Pancreatic cancer Neg Hx    Stomach cancer Neg Hx    Liver cancer Neg Hx    Esophageal cancer Neg Hx    Social History:  Social History   Substance and Sexual Activity  Alcohol Use Yes   Comment: Rare     Social History   Substance and Sexual Activity  Drug Use Never    Social History   Socioeconomic History   Marital status: Married    Spouse name: Not on file   Number of children: 0   Years of education: Not on file   Highest education level: Master's degree (e.g., MA, MS, MEng, MEd, MSW, MBA)  Occupational History   Occupation: retired, Chief Financial Officer  Tobacco Use   Smoking status: Never   Smokeless tobacco: Never  Vaping Use   Vaping status: Never Used  Substance and Sexual Activity   Alcohol use: Yes    Comment: Rare   Drug use: Never   Sexual activity: Not Currently  Other Topics Concern   Not on file  Social History Narrative   Moved from [place] June 2022   Lives w/ husband   Moved to stay close to her sister (Mrs Andra)   Social Drivers of Health   Financial Resource Strain: Low Risk  (01/27/2023)   Received from [facility]   Overall Financial Resource Strain (CARDIA)    Difficulty of Paying Living Expenses: Not hard at all  Food Insecurity: Patient Declined (07/14/2024)   Hunger Vital  Sign    Worried About Programme researcher, broadcasting/film/video in the Last Year: Patient declined    Barista in the Last Year: Patient declined  Transportation Needs: Patient Declined (07/14/2024)   PRAPARE - Administrator, Civil Service (Medical): Patient declined    Lack of Transportation (Non-Medical): Patient declined  Physical Activity: Sufficiently Active (01/27/2023)   Received from [facility]   Exercise Vital Sign    On average, how many days per week do you engage in moderate to strenuous exercise (like a brisk walk)?: 5 days    On average, how many  minutes do you engage in exercise at this level?: 30 min  Stress: No Stress Concern Present (01/27/2023)   Received from [facility] - Occupational Stress Questionnaire    Feeling of Stress : Only a little  Social Connections: Moderately Isolated (07/14/2024)   Social Connection and Isolation Panel    Frequency of Communication with Friends and Family: Three times a week    Frequency of Social Gatherings with Friends and Family: Once a week    Attends Religious Services: Never    Database administrator or Organizations: No    Attends Engineer, structural: Never    Marital Status: Married   Past Medical History:  Past Medical History:  Diagnosis Date   Anxiety and depression    Arthritis    Cervicogenic headache    Claustrophobia    Glaucoma    Hypertension    Lumbar radiculopathy    Postsurgical hypothyroidism    Spinal stenosis    Thyroid  cancer (HCC)    s/p thyroidectomy    Past Surgical History:  Procedure Laterality Date   FOOT SURGERY     LAMINOTOMY  04/03/2021   LEFT L3/4 LAMINOTOMY BILATERAL L3/4 MEDIAL FACETECTOMY   LUMBAR LAMINECTOMY  03/03/2018   THYROIDECTOMY  12/2004    Current Medications: Current Facility-Administered Medications  Medication Dose Route Frequency Provider Last Rate Last Admin   acetaminophen  (TYLENOL ) tablet 650 mg  650 mg Oral Q6H PRN Motley-Mangrum, Jadeka A, PMHNP       alum & mag hydroxide-simeth (MAALOX/MYLANTA) 200-200-20 MG/5ML suspension 30 mL  30 mL Oral Q4H PRN Motley-Mangrum, Jadeka A, PMHNP       ARIPiprazole  (ABILIFY ) tablet 5 mg  5 mg Oral Daily Chadrick Sprinkle, MD   5 mg at 07/22/24 9094   clonazepam  (KLONOPIN ) disintegrating tablet 0.25 mg  0.25 mg Oral BID Ryman Rathgeber, MD   0.25 mg at 07/22/24 2111   haloperidol  (HALDOL ) tablet 5 mg  5 mg Oral TID PRN Motley-Mangrum, Jadeka A, PMHNP       And   diphenhydrAMINE  (BENADRYL ) capsule 50 mg  50 mg Oral TID PRN Motley-Mangrum,  Jadeka A, PMHNP       haloperidol  lactate (HALDOL ) injection 10 mg  10 mg Intramuscular TID PRN Derral Colucci, MD       And   diphenhydrAMINE  (BENADRYL ) injection 50 mg  50 mg Intramuscular TID PRN Donnelly Mellow, MD       And   LORazepam  (ATIVAN ) tablet 2 mg  2 mg Oral TID PRN Octavie Westerhold, MD       haloperidol  lactate (HALDOL ) injection 5 mg  5 mg Intramuscular TID PRN Rolfe Hartsell, MD       And   diphenhydrAMINE  (BENADRYL ) injection 50 mg  50 mg Intramuscular TID PRN Donnelly Mellow, MD       And  LORazepam  (ATIVAN ) tablet 2 mg  2 mg Oral TID PRN Pilar Westergaard, MD       feeding supplement (ENSURE PLUS HIGH PROTEIN) liquid 237 mL  237 mL Oral BID BM Tiare Rohlman, MD   237 mL at 07/20/24 1034   hydrOXYzine  (ATARAX ) tablet 25 mg  25 mg Oral TID PRN Motley-Mangrum, Jadeka A, PMHNP   25 mg at 07/18/24 0046   levothyroxine  (SYNTHROID ) tablet 137 mcg  137 mcg Oral Q0600 Motley-Mangrum, Jadeka A, PMHNP   137 mcg at 07/21/24 0642   magnesium  hydroxide (MILK OF MAGNESIA) suspension 30 mL  30 mL Oral Daily PRN Motley-Mangrum, Jadeka A, PMHNP   30 mL at 07/19/24 2203   OLANZapine  zydis (ZYPREXA ) disintegrating tablet 2.5 mg  2.5 mg Oral Q8H PRN Motley-Mangrum, Jadeka A, PMHNP   2.5 mg at 07/17/24 0617   sertraline  (ZOLOFT ) tablet 50 mg  50 mg Oral Daily Brandon Wiechman, MD   50 mg at 07/22/24 9094   traZODone  (DESYREL ) tablet 50 mg  50 mg Oral QHS PRN Motley-Mangrum, Jadeka A, PMHNP   50 mg at 07/22/24 2111    Lab Results: No results found for this or any previous visit (from the past 48 hours).  Blood Alcohol level:  Lab Results  Component Value Date   [facility] <15 07/13/2024   ETH <15 05/14/2024    Metabolic Disorder Labs: Lab Results  Component Value Date   HGBA1C 5.1 06/07/2022   MPG 100 06/07/2022   No results found for: PROLACTIN Lab Results  Component Value Date   CHOL 309 (H) 06/07/2022   TRIG 62 06/07/2022   HDL 75 06/07/2022   CHOLHDL 4.1 06/07/2022   VLDL 12  06/07/2022   LDLCALC 222 (H) 06/07/2022   LDLCALC 125 (H) 11/27/2021    Physical Findings: AIMS:  , ,  ,  ,    CIWA:    COWS:      Psychiatric Specialty Exam:  Presentation  General Appearance:  Appropriate for Environment; Casual  Eye Contact: Fair  Speech: Slow  Speech Volume: Normal    Mood and Affect  Mood: Anxious; Depressed  Affect: Depressed; Flat   Thought Process  Thought Processes: Irrevelant  Descriptions of Associations:Intact  Orientation:Partial  Thought Content:Illogical  Hallucinations: Denies  Ideas of Reference:Paranoia  Suicidal Thoughts: Denies Homicidal Thoughts: Denies   Sensorium  Memory: Immediate Fair; Recent Fair; Remote Poor  Judgment: Impaired  Insight: Shallow   Executive Functions  Concentration: Poor  Attention Span: Poor  Recall: Poor  Fund of Knowledge: Fair  Language: Fair   Psychomotor Activity  Psychomotor Activity: No data recorded  Musculoskeletal: Strength & Muscle Tone: within normal limits Gait & Station: normal Assets  Assets: Manufacturing systems engineer; Desire for Improvement; Resilience; Social Support    Physical Exam: Physical Exam Vitals and nursing note reviewed.  HENT:     Head: Normocephalic.     Nose: Nose normal.  Cardiovascular:     Rate and Rhythm: Normal rate.  Pulmonary:     Effort: Pulmonary effort is normal.  Neurological:     Mental Status: She is alert.    Review of Systems  Constitutional: Negative.   HENT: Negative.    Eyes: Negative.   Cardiovascular: Negative.   Skin: Negative.    Blood pressure 98/61, pulse 69, temperature 97.9 F (36.6 C), resp. rate 20, height 5' 8 (1.727 m), weight 66 kg, SpO2 99%. Body mass index is 22.12 kg/m.  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  Patient has stopped all her medications and has not followed up with any outpatient psychiatry.  Patient needs close monitoring and medication management during this inpatient hospitalization.   Treatment Plan Summary:   Safety and Monitoring:             -- Voluntary admission to inpatient psychiatric unit for safety, stabilization and treatment             -- Daily contact with patient to assess and evaluate symptoms and progress in treatment             -- Patient's case to be discussed in multi-disciplinary team meeting             -- Observation Level: q15 minute checks             -- Vital signs:  q12 hours             -- Precautions: suicide, elopement, and assault   2. Psychiatric Diagnoses and Treatment:  Increased Klonopin  0.5 mg BID, Zoloft  50 mg daily Will increase Abilify  to 7.5 mg daily to help with paranoia   -- The risks/benefits/side-effects/alternatives to this medication were discussed in detail with the patient and time was given for questions. The patient consents to medication trial.                -- Metabolic profile and EKG monitoring obtained while on an atypical antipsychotic (BMI: Lipid Panel: HbgA1c: QTc:)              -- Encouraged patient to participate in unit milieu and in scheduled group therapies   4. Discharge Planning:   -- Social work and case management to assist with discharge planning and identification of hospital follow-up needs prior to discharge  -- Estimated LOS: 3-4 days  Marcea Rojek, MD 07/22/2024, 10:00 PM

## 2024-07-22 NOTE — Progress Notes (Signed)
 07/22/2024  Letter to the Court  Reg: Brent W.Dralle DOB: 1950-12-24  Ms.Marzella Dimario has been hospitalized since 07/14/24 at [facility]) inpatient psychiatric unit. She is currently being treated for anxiety, psychoses and possible Major Neurocognitive disorder (Dementia) of rapid decline in functioning. Patient  remains very confused about daily activities, she needs lot of help in taking her medications etc., Ms.Holstrom is responding slowly to the medications with ongoing paranoia and confusion. As we know its time consuming process, we appreciate the court's assistance in stabilizing and currently we request another 30 days of inpatient care to further stabilize.  Sincerely, Vivaan Helseth.MD [facility]

## 2024-07-22 NOTE — Progress Notes (Signed)
   07/22/24 1100  Psych Admission Type (Psych Patients Only)  Admission Status Involuntary  Psychosocial Assessment  Patient Complaints Worrying;Anxiety;Depression  Eye Contact Fair  Facial Expression Anxious;Worried  Affect Anxious;Depressed  Therapist, music Activity Restless  Appearance/Hygiene In scrubs  Behavior Characteristics Anxious;Guarded  Mood Anxious  Thought Process  Coherency Disorganized  Content Preoccupation  Delusions None reported or observed  Perception WDL  Hallucination None reported or observed  Judgment Impaired  Confusion Moderate  Danger to Self  Current suicidal ideation? Denies  Danger to Others  Danger to Others None reported or observed

## 2024-07-22 NOTE — Progress Notes (Signed)
 Pt refusing scheduled synthroid  this morning stating no, I am not suppose to take that. Multiple reattempts made and pt encouraged to take medication. Will pass on to next shift.

## 2024-07-23 DIAGNOSIS — F321 Major depressive disorder, single episode, moderate: Secondary | ICD-10-CM | POA: Diagnosis not present

## 2024-07-23 DIAGNOSIS — F411 Generalized anxiety disorder: Secondary | ICD-10-CM | POA: Diagnosis not present

## 2024-07-23 MED ORDER — CLONAZEPAM 0.25 MG PO TBDP
0.5000 mg | ORAL_TABLET | Freq: Every day | ORAL | Status: DC
  Administered 2024-07-24 – 2024-07-26 (×3): 0.5 mg via ORAL
  Filled 2024-07-23 (×3): qty 4

## 2024-07-23 NOTE — Progress Notes (Signed)
   07/23/24 1135  Spiritual Encounters  Type of Visit Follow up  Care provided to: Patient  Conversation partners present during encounter Other (comment) (peer)  Referral source Patient request  Reason for visit Routine spiritual support  OnCall Visit No   While rounding on unit, [name] was in dayroom and requested visit. For part of the visit we were joined by a peer where they engaged in shared reflection around scripture that they find meaningful and that offers the hope and strength.  I spoke one on one with Alyssa [name] and it was a very different quality of conversation than before. While living in [place] shared that she was actively engaged in a [employer] retreat center and, although we have spoken before about grief due to loss of community generally since moving to [place] three years ago, Alyssa [name] revealed a unique aspect of loss that is profoundly spiritual. Alyssa [name] absolutely came to life speaking about this. She also mentioned feeling encouraged by having phone conversations recently with her husband and sister, feeling encouraged that relationship with sister can improve.  I provided active and reflective listening. I affirmed and invited deeper reflection about what Alyssa [name] named that she finds life-giving. We talked about goals and ways she can reclaim elements of this experience (eg, her and her husband could visit a local retreat center they know of). I facilitated meaning making around current hospital experience. I affirmed the elements Finlay named that she feels hopeful about. Will continue to follow.  Alyssa [name] L. Fredrica, M.Div [phone]

## 2024-07-23 NOTE — Group Note (Signed)
 Recreation Therapy Group Note   Group Topic:Relaxation  Group Date: 07/23/2024 Start Time: 1400 End Time: 1500 Facilitators: Celestia Jeoffrey BRAVO, LRT, CTRS Location: Courtyard  Group Description: Music. Patients encouraged to name their favorite song(s) for LRT to play song through speaker for group to hear, while in the courtyard getting fresh air and sunlight. Patients educated on the definition of leisure and the importance of having different leisure interests outside of the hospital. Group discussed how leisure activities can often be used as Pharmacologist and that listening to music and being outside are examples.    Goal Area(s) Addressed:  Patient will identify a current leisure interest.  Patient will practice making a positive decision. Patient will have the opportunity to try a new leisure activity.   Affect/Mood: N/A   Participation Level: Did not attend    Clinical Observations/Individualized Feedback: Patient did not attend group.   Plan: Continue to engage patient in RT group sessions 2-3x/week.   Jeoffrey BRAVO Celestia, LRT, CTRS 07/23/2024 5:30 PM

## 2024-07-23 NOTE — Progress Notes (Signed)
   07/22/24 2100  Psych Admission Type (Psych Patients Only)  Admission Status Involuntary  Psychosocial Assessment  Patient Complaints Anxiety;Worrying  Eye Contact Fair  Facial Expression Anxious;Worried  Affect Anxious;Depressed  Chartered loss adjuster Guarded  Motor Activity Slow  Appearance/Hygiene In scrubs  Behavior Characteristics Anxious;Guarded  Mood Anxious  Thought Process  Coherency Disorganized  Content Preoccupation  Delusions None reported or observed  Perception WDL  Hallucination None reported or observed  Judgment Impaired  Confusion Moderate  Danger to Self  Current suicidal ideation? Denies  Danger to Others  Danger to Others None reported or observed

## 2024-07-23 NOTE — Plan of Care (Signed)
  Problem: Self-Concept: Goal: Ability to identify factors that promote anxiety will improve Outcome: Progressing Goal: Level of anxiety will decrease Outcome: Progressing Goal: Ability to modify response to factors that promote anxiety will improve Outcome: Progressing   

## 2024-07-23 NOTE — Progress Notes (Signed)
 [facility] MD Progress Note  07/23/2024 9:46 PM Alyssa [name]  MRN:  [id number] Alyssa [name] is a 73 y.o. female admitted: Presented to the ED on 05/14/2024  1:26 PM for  having episodes non stop hours of pacing, fidgeting with hands, saying words that do not make sense, and paranoia. 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 She meets criteria for inpatient psychiatric admission.Patient is admitted to [facility] unit with Q15 min safety monitoring. Multidisciplinary team approach is offered. Medication management; group/milieu therapy is offered.   Subjective:  Chart reviewed, case discussed in multidisciplinary meeting, patient seen during rounds.  Patient requested to talk to the provider in her room.  She reports that she is concerned about number of pills she is taking.  She remains concrete and refuses to accept that she needs that dosage to help with her confusion and anxiety.  She requested to take only nighttime dose of Klonopin  at 0.5 mg nightly and continue Abilify  to help with the mood stabilization.  She denies SI/HI/plan.  Provider discussed at length about her behaviors of entering other patient's room, eating from other patients plate. Sleep: Fair  Appetite:  Fair  Past Psychiatric History: see h&P Family History:  Family History  Problem Relation Age of Onset   Hypertension Sister    Thyroid  cancer Brother    Skin cancer Brother    Diabetes Paternal Grandmother    Colon cancer Neg Hx    Breast cancer Neg Hx    Pancreatic cancer Neg Hx    Stomach cancer Neg Hx    Liver  cancer Neg Hx    Esophageal cancer Neg Hx    Social History:  Social History   Substance and Sexual Activity  Alcohol Use Yes   Comment: Rare     Social History   Substance and Sexual Activity  Drug Use Never    Social History   Socioeconomic History   Marital status: Married    Spouse name: Not on file   Number of children: 0   Years of education: Not on file   Highest education level: Master's degree (e.g., MA, MS, MEng, MEd, MSW, MBA)  Occupational History   Occupation: retired, Chief Financial Officer  Tobacco Use   Smoking status: Never   Smokeless tobacco: Never  Vaping Use   Vaping status: Never Used  Substance and Sexual Activity   Alcohol use: Yes    Comment: Rare   Drug use: Never   Sexual activity: Not Currently  Other Topics Concern   Not on file  Social History Narrative   Moved from [place] June 2022   Lives w/ husband   Moved to stay close to her sister (Mrs Andra)   Social Drivers of Health   Financial Resource Strain: Low Risk  (01/27/2023)   Received from [facility]   Overall Financial Resource Strain (CARDIA)    Difficulty of Paying Living Expenses: Not hard at all  Food Insecurity: Patient Declined (07/14/2024)   Hunger Vital Sign    Worried About Running Out of Food in the Last Year: Patient declined    Ran Out of Food in the Last Year: Patient declined  Transportation  Needs: Patient Declined (07/14/2024)   PRAPARE - Administrator, Civil Service (Medical): Patient declined    Lack of Transportation (Non-Medical): Patient declined  Physical Activity: Sufficiently Active (01/27/2023)   Received from [facility]   Exercise Vital Sign    On average, how many days per week do you engage in moderate to strenuous exercise (like a brisk walk)?: 5 days    On average, how many minutes do you engage in exercise at this level?: 30 min  Stress: No Stress Concern Present (01/27/2023)   Received from [facility] - Occupational Stress Questionnaire    Feeling of Stress : Only a little  Social Connections: Moderately Isolated (07/14/2024)   Social Connection and Isolation Panel    Frequency of Communication with Friends and Family: Three times a week    Frequency of Social Gatherings with Friends and Family: Once a week    Attends Religious Services: Never    Database administrator or Organizations: No    Attends Engineer, structural: Never    Marital Status: Married   Past Medical History:  Past Medical History:  Diagnosis Date   Anxiety and depression    Arthritis    Cervicogenic headache    Claustrophobia    Glaucoma    Hypertension    Lumbar radiculopathy    Postsurgical hypothyroidism    Spinal stenosis    Thyroid  cancer (HCC)    s/p thyroidectomy    Past Surgical History:  Procedure Laterality Date   FOOT SURGERY     LAMINOTOMY  04/03/2021   LEFT L3/4 LAMINOTOMY BILATERAL L3/4 MEDIAL FACETECTOMY   LUMBAR LAMINECTOMY  03/03/2018   THYROIDECTOMY  12/2004    Current Medications: Current Facility-Administered Medications  Medication Dose Route Frequency Provider Last Rate Last Admin   acetaminophen  (TYLENOL ) tablet 650 mg  650 mg Oral Q6H PRN Motley-Mangrum, Jadeka A, PMHNP       alum & mag hydroxide-simeth (MAALOX/MYLANTA) 200-200-20 MG/5ML suspension 30 mL  30 mL Oral Q4H PRN Motley-Mangrum, Jadeka A, PMHNP       ARIPiprazole  (ABILIFY ) tablet 7.5 mg  7.5 mg Oral Daily Yuko Coventry, MD   7.5 mg at 07/23/24 0959   [START ON 07/24/2024] clonazePAM  (KLONOPIN ) disintegrating tablet 0.5 mg  0.5 mg Oral QHS Fady Stamps, MD       haloperidol  (HALDOL ) tablet 5 mg  5 mg Oral TID PRN Motley-Mangrum, Jadeka A, PMHNP       And   diphenhydrAMINE  (BENADRYL ) capsule 50 mg  50 mg Oral TID PRN Motley-Mangrum, Jadeka A, PMHNP       haloperidol  lactate (HALDOL ) injection 10 mg  10 mg Intramuscular TID PRN Kiauna Zywicki, MD       And   diphenhydrAMINE  (BENADRYL ) injection 50  mg  50 mg Intramuscular TID PRN Avi Archuleta, MD       And   LORazepam  (ATIVAN ) tablet 2 mg  2 mg Oral TID PRN Devian Bartolomei, MD       haloperidol  lactate (HALDOL ) injection 5 mg  5 mg Intramuscular TID PRN Onofre Gains, MD       And   diphenhydrAMINE  (BENADRYL ) injection 50 mg  50 mg Intramuscular TID PRN Khaleb Broz, MD       And   LORazepam  (ATIVAN ) tablet 2 mg  2 mg Oral TID PRN Royanne Warshaw, MD       feeding supplement (ENSURE PLUS HIGH PROTEIN) liquid 237 mL  237 mL Oral BID BM Jeanice Dempsey, MD   237 mL at 07/20/24 1034   hydrOXYzine  (ATARAX ) tablet 25 mg  25 mg Oral TID PRN Motley-Mangrum, Jadeka A, PMHNP   25 mg at 07/18/24 0046   levothyroxine  (SYNTHROID ) tablet 137 mcg  137 mcg Oral Q0600 Motley-Mangrum, Jadeka A, PMHNP   137 mcg at 07/23/24 0653   magnesium  hydroxide (MILK OF MAGNESIA) suspension 30 mL  30 mL Oral Daily PRN Motley-Mangrum, Jadeka A, PMHNP   30 mL at 07/19/24 2203   OLANZapine  zydis (ZYPREXA ) disintegrating tablet 2.5 mg  2.5 mg Oral Q8H PRN Motley-Mangrum, Jadeka A, PMHNP   2.5 mg at 07/17/24 0617   sertraline  (ZOLOFT ) tablet 50 mg  50 mg Oral Daily Shristi Scheib, MD   50 mg at 07/23/24 1000   traZODone  (DESYREL ) tablet 50 mg  50 mg Oral QHS PRN Motley-Mangrum, Jadeka A, PMHNP   50 mg at 07/22/24 2111    Lab Results: No results found for this or any previous visit (from the past 48 hours).  Blood Alcohol level:  Lab Results  Component Value Date   [facility] <15 07/13/2024   ETH <15 05/14/2024    Metabolic Disorder Labs: Lab Results  Component Value Date   HGBA1C 5.1 06/07/2022   MPG 100 06/07/2022   No results found for: PROLACTIN Lab Results  Component Value Date   CHOL 309 (H) 06/07/2022   TRIG 62 06/07/2022   HDL 75 06/07/2022   CHOLHDL 4.1 06/07/2022   VLDL 12 06/07/2022   LDLCALC 222 (H) 06/07/2022   LDLCALC 125 (H) 11/27/2021    Physical Findings: AIMS:  , ,  ,  ,    CIWA:    COWS:      Psychiatric Specialty  Exam:  Presentation  General Appearance:  Appropriate for Environment; Casual  Eye Contact: Fair  Speech: Slow  Speech Volume: Normal    Mood and Affect  Mood: Anxious; Depressed  Affect: Depressed; Flat   Thought Process  Thought Processes: Irrevelant  Descriptions of Associations:Intact  Orientation:Partial  Thought Content:Illogical  Hallucinations: Denies  Ideas of Reference:Paranoia  Suicidal Thoughts: Denies Homicidal Thoughts: Denies   Sensorium  Memory: Immediate Fair; Recent Fair; Remote Poor  Judgment: Impaired  Insight: Shallow   Executive Functions  Concentration: Poor  Attention Span: Poor  Recall: Poor  Fund of Knowledge: Fair  Language: Fair   Psychomotor Activity  Psychomotor Activity: No data recorded  Musculoskeletal: Strength & Muscle Tone: within normal limits Gait & Station: normal Assets  Assets: Manufacturing systems engineer; Desire for Improvement; Resilience; Social Support    Physical Exam: Physical Exam Vitals and nursing note reviewed.  HENT:     Head: Normocephalic.     Nose: Nose normal.  Cardiovascular:     Rate and Rhythm: Normal rate.  Pulmonary:     Effort: Pulmonary effort is normal.  Neurological:     Mental Status: She is alert.    Review of Systems  Constitutional: Negative.   HENT: Negative.    Eyes: Negative.   Cardiovascular: Negative.   Skin: Negative.    Blood pressure (!) 122/56, pulse 60, temperature (!) 97.5 F (36.4 C), resp. rate 18, height 5' 8 (1.727 m), weight 66 kg, SpO2 100%. Body mass index is 22.12 kg/m.  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  Patient has stopped all her medications and has not followed up with any outpatient psychiatry.  Patient needs close monitoring and medication management during this inpatient hospitalization.   Treatment Plan Summary:   Safety and Monitoring:             -- Voluntary admission to inpatient psychiatric unit for safety, stabilization and treatment             -- Daily contact with patient to assess and evaluate symptoms and progress in treatment             -- Patient's case to be discussed in multi-disciplinary team meeting             -- Observation Level: q15 minute checks             -- Vital signs:  q12 hours             -- Precautions: suicide, elopement, and assault   2. Psychiatric Diagnoses and Treatment:  Patient is declining increased dose of Klonopin  and is requesting to take only Klonopin  0.5 mg nightly Zoloft  50 mg daily  Abilify  to 7.5 mg daily to help with paranoia   -- The risks/benefits/side-effects/alternatives to this medication were discussed in detail with the patient and time was given for questions. The patient consents to medication trial.                -- Metabolic profile and EKG monitoring obtained while on an atypical antipsychotic (BMI: Lipid Panel: HbgA1c: QTc:)              -- Encouraged patient to participate in unit milieu and in scheduled group therapies   4. Discharge Planning:   -- Social work and case management to assist with discharge planning and identification of hospital follow-up needs prior to discharge  -- Estimated LOS: 3-4 days  Tawsha Terrero, MD 07/23/2024, 9:46 PM

## 2024-07-23 NOTE — Group Note (Signed)
 Date:  07/23/2024 Time:  10:55 AM  Group Topic/Focus:  Dimensions of Wellness:   The focus of this group is to introduce the topic of wellness and discuss the role each dimension of wellness plays in total health.    Participation Level:  Active  Participation Quality:    Affect:  Appropriate  Cognitive:  Alert  Insight: Appropriate  Engagement in Group:  Engaged  Modes of Intervention:  Activity  Additional Comments:  N/A  Harlene LITTIE Gavel 07/23/2024, 10:55 AM

## 2024-07-23 NOTE — Plan of Care (Signed)
  Problem: Education: Goal: Ability to state activities that reduce stress will improve Outcome: Progressing   Problem: Coping: Goal: Ability to identify and develop effective coping behavior will improve Outcome: Progressing   Problem: Self-Concept: Goal: Ability to identify factors that promote anxiety will improve Outcome: Progressing Goal: Level of anxiety will decrease Outcome: Progressing Goal: Ability to modify response to factors that promote anxiety will improve Outcome: Progressing   Problem: Education: Goal: Utilization of techniques to improve thought processes will improve Outcome: Progressing Goal: Knowledge of the prescribed therapeutic regimen will improve Outcome: Progressing   Problem: Activity: Goal: Interest or engagement in leisure activities will improve Outcome: Progressing Goal: Imbalance in normal sleep/wake cycle will improve Outcome: Progressing   Problem: Coping: Goal: Coping ability will improve Outcome: Progressing Goal: Will verbalize feelings Outcome: Progressing   Problem: Health Behavior/Discharge Planning: Goal: Ability to make decisions will improve Outcome: Progressing Goal: Compliance with therapeutic regimen will improve Outcome: Progressing   Problem: Role Relationship: Goal: Will demonstrate positive changes in social behaviors and relationships Outcome: Progressing   Problem: Safety: Goal: Ability to disclose and discuss suicidal ideas will improve Outcome: Progressing Goal: Ability to identify and utilize support systems that promote safety will improve Outcome: Progressing   Problem: Self-Concept: Goal: Will verbalize positive feelings about self Outcome: Progressing Goal: Level of anxiety will decrease Outcome: Progressing   Problem: Education: Goal: Ability to make informed decisions regarding treatment will improve Outcome: Progressing   Problem: Coping: Goal: Coping ability will improve Outcome:  Progressing   Problem: Health Behavior/Discharge Planning: Goal: Identification of resources available to assist in meeting health care needs will improve Outcome: Progressing   Problem: Medication: Goal: Compliance with prescribed medication regimen will improve Outcome: Progressing   Problem: Self-Concept: Goal: Ability to disclose and discuss suicidal ideas will improve Outcome: Progressing Goal: Will verbalize positive feelings about self Outcome: Progressing Note:

## 2024-07-24 DIAGNOSIS — F411 Generalized anxiety disorder: Secondary | ICD-10-CM | POA: Diagnosis not present

## 2024-07-24 DIAGNOSIS — F321 Major depressive disorder, single episode, moderate: Secondary | ICD-10-CM | POA: Diagnosis not present

## 2024-07-24 NOTE — Progress Notes (Signed)
   07/24/24 0643  15 Minute Checks  Location Bedroom  Visual Appearance Calm  Behavior Sleeping  OTHER  Documented sleep last 24 hours 9  Sleep (Behavioral Health Patients Only)  Calculate sleep? (Click Yes once per 24 hr at 0600 safety check) Yes

## 2024-07-24 NOTE — Group Note (Signed)
 Recreation Therapy Group Note   Group Topic:Emotion Expression  Group Date: 07/24/2024 Start Time: 1400 End Time: 1500 Facilitators: Celestia Jeoffrey BRAVO, LRT, CTRS Location: Dayroom  Group Description: Painting a Diplomatic Services operational officer. Patients and LRT discuss what it means to be "at peace", what it feels like physically and mentally. Pts are given a canvas and watercolor paint to use and encouraged to draw their idea of a peaceful place. Pts and LRT discuss how they use this in their daily life post discharge. Pts are encouraged to take their canvas home with them as a reminder to find their peaceful place whenever they are feeling depressed, anxious, etc.    Goal Area(s) Addressed:  Patient will identify what it means to experience a "peaceful" emotion. Patient will identify a new coping skill.  Patient will express their emotions through art. Patients will increase communication by talking with LRT and peers while in group.   Affect/Mood: Appropriate   Participation Level: Active and Engaged   Participation Quality: Independent   Behavior: Appropriate, Calm, and Cooperative   Speech/Thought Process: Coherent   Insight: Good   Judgement: Good   Modes of Intervention: Art   Patient Response to Interventions:  Attentive, Engaged, and Receptive   Education Outcome:  Acknowledges education   Clinical Observations/Individualized Feedback: Alyssa [name] was active in their participation of session activities and group discussion. Pt identified the ocean or water  as her [peaceful place. Pt interacted well with LRT and peers duration of session.    Plan: Continue to engage patient in RT group sessions 2-3x/week.   [address] LRT, CTRS 07/24/2024 5:02 PM

## 2024-07-24 NOTE — Group Note (Signed)
 Date:  07/24/2024 Time:  10:36 PM  Group Topic/Focus:  Self Care:   The focus of this group is to help patients understand the importance of self-care in order to improve or restore emotional, physical, spiritual, interpersonal, and financial health. MHT went over sleep hygiene along with rules and expectations for our unit while also identifying who the nurses and techs are.    Participation Level:  Minimal  Participation Quality:  Appropriate  Affect:  Appropriate  Cognitive:  Appropriate  Insight: Appropriate  Engagement in Group:  Engaged  Modes of Intervention:  Discussion  Additional Comments:    Leigh VEAR Pais 07/24/2024, 10:36 PM

## 2024-07-24 NOTE — Group Note (Signed)
 Physical/Occupational Therapy Group Note  Group Topic: UE Therex   Group Date: 07/24/2024 Start Time: 1300 End Time: 1350 Facilitators: Clive Warren CROME, OT     Group Description: Group instructed in series of upper extremities exercises, aimed to promote strength, flexibility, range of motion and functional endurance.  Patients provided cuing for proper mechanics and proper pace of exercise; exercises adjusted as necessary for individualized patient needs.  Patient also engaged in cognitive components throughout session, working to integrate attention to task, command following, turn-taking and appropriate social interaction throughout session.  Allowed to ask questions as appropriate, and encouraged to identify specific exercises that they could complete independently outside of group sessions.   Therapeutic Goal(s): Demonstrate appropriate performance of upper extremity exercises to promote strength, flexibility, range of motion and functional endurance Identify 2-3 specific upper extremity exercises to complete as home exercise program outside of group session   Individual Participation: Pt pleasant and engaged throughout, interactive with prompting and requiring MIN multimodal cues for technique. Able to verbalize plan to implement outside session.    Participation Level: Active and Engaged   Participation Quality: Minimal Cues   Behavior: Alert, Attentive , Calm, and Reserved   Speech/Thought Process: Barely audible, Organized, and Relevant   Affect/Mood: Appropriate   Insight: Fair   Judgement: Fair   Modes of Intervention: Activity, Clarification, Discussion, Education, Exploration, Problem-solving, Rapport Building, Socialization, and Support  Patient Response to Interventions:  Attentive, Engaged, and Receptive   Plan: Continue to engage patient in PT/OT groups 1 - 2x/week.  Coumba Kellison R., MPH, MS, OTR/L ascom [phone] 07/24/24, 5:08 PM

## 2024-07-24 NOTE — Plan of Care (Signed)
   Problem: Education: Goal: Ability to state activities that reduce stress will improve Outcome: Progressing   Problem: Coping: Goal: Ability to identify and develop effective coping behavior will improve Outcome: Progressing   Problem: Self-Concept: Goal: Ability to identify factors that promote anxiety will improve Outcome: Progressing Goal: Level of anxiety will decrease Outcome: Progressing

## 2024-07-24 NOTE — Progress Notes (Signed)
 [facility] MD Progress Note  07/24/2024 3:20 PM Alyssa [name]  MRN:  [id number] Alyssa [name] is a 73 y.o. female admitted: Presented to the ED on 05/14/2024  1:26 PM for  having episodes non stop hours of pacing, fidgeting with hands, saying words that do not make sense, and paranoia. 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 She meets criteria for inpatient psychiatric admission.Patient is admitted to [facility] unit with Q15 min safety monitoring. Multidisciplinary team approach is offered. Medication management; group/milieu therapy is offered.   Subjective:  Chart reviewed, case discussed in multidisciplinary meeting, patient seen during rounds.  On interview patient reports that she is ready for discharge.  Provider explained to her about her request for cutting down on Klonopin  and the need for her to be monitored till Monday.  Patient reports that she is not confused and is doing better with her anxiety.  She denies SI/HI/plan and denies hallucinations.  Per nursing report patient remains confused at times having hard time maintaining her personal boundaries with patient's.  Will continue to monitor her behaviors on the unit.    Sleep: Fair  Appetite:  Fair  Past Psychiatric History: see h&P Family History:  Family History  Problem Relation Age of Onset   Hypertension Sister    Thyroid  cancer Brother    Skin cancer Brother    Diabetes Paternal Grandmother    Colon cancer Neg Hx    Breast cancer Neg Hx    Pancreatic cancer Neg Hx    Stomach cancer Neg Hx    Liver cancer Neg  Hx    Esophageal cancer Neg Hx    Social History:  Social History   Substance and Sexual Activity  Alcohol Use Yes   Comment: Rare     Social History   Substance and Sexual Activity  Drug Use Never    Social History   Socioeconomic History   Marital status: Married    Spouse name: Not on file   Number of children: 0   Years of education: Not on file   Highest education level: Master's degree (e.g., MA, MS, MEng, MEd, MSW, MBA)  Occupational History   Occupation: retired, Chief Financial Officer  Tobacco Use   Smoking status: Never   Smokeless tobacco: Never  Vaping Use   Vaping status: Never Used  Substance and Sexual Activity   Alcohol use: Yes    Comment: Rare   Drug use: Never   Sexual activity: Not Currently  Other Topics Concern   Not on file  Social History Narrative   Moved from [place] June 2022   Lives w/ husband   Moved to stay close to her sister (Mrs Andra)   Social Drivers of Health   Financial Resource Strain: Low Risk  (01/27/2023)   Received from [facility]   Overall Financial Resource Strain (CARDIA)    Difficulty of Paying Living Expenses: Not hard at all  Food Insecurity: Patient Declined (07/14/2024)   Hunger Vital Sign    Worried About Running Out of Food in the Last Year: Patient declined    Ran Out of Food in the Last Year: Patient declined  Transportation Needs: Patient Declined (  07/14/2024)   PRAPARE - Transportation    Lack of Transportation (Medical): Patient declined    Lack of Transportation (Non-Medical): Patient declined  Physical Activity: Sufficiently Active (01/27/2023)   Received from [facility]   Exercise Vital Sign    On average, how many days per week do you engage in moderate to strenuous exercise (like a brisk walk)?: 5 days    On average, how many minutes do you engage in exercise at this level?: 30 min  Stress: No Stress Concern Present (01/27/2023)   Received from [facility] -  Occupational Stress Questionnaire    Feeling of Stress : Only a little  Social Connections: Moderately Isolated (07/14/2024)   Social Connection and Isolation Panel    Frequency of Communication with Friends and Family: Three times a week    Frequency of Social Gatherings with Friends and Family: Once a week    Attends Religious Services: Never    Database administrator or Organizations: No    Attends Engineer, structural: Never    Marital Status: Married   Past Medical History:  Past Medical History:  Diagnosis Date   Anxiety and depression    Arthritis    Cervicogenic headache    Claustrophobia    Glaucoma    Hypertension    Lumbar radiculopathy    Postsurgical hypothyroidism    Spinal stenosis    Thyroid  cancer (HCC)    s/p thyroidectomy    Past Surgical History:  Procedure Laterality Date   FOOT SURGERY     LAMINOTOMY  04/03/2021   LEFT L3/4 LAMINOTOMY BILATERAL L3/4 MEDIAL FACETECTOMY   LUMBAR LAMINECTOMY  03/03/2018   THYROIDECTOMY  12/2004    Current Medications: Current Facility-Administered Medications  Medication Dose Route Frequency Provider Last Rate Last Admin   acetaminophen  (TYLENOL ) tablet 650 mg  650 mg Oral Q6H PRN Motley-Mangrum, Jadeka A, PMHNP       alum & mag hydroxide-simeth (MAALOX/MYLANTA) 200-200-20 MG/5ML suspension 30 mL  30 mL Oral Q4H PRN Motley-Mangrum, Jadeka A, PMHNP       ARIPiprazole  (ABILIFY ) tablet 7.5 mg  7.5 mg Oral Daily Ruqaya Strauss, MD   7.5 mg at 07/24/24 1008   clonazePAM  (KLONOPIN ) disintegrating tablet 0.5 mg  0.5 mg Oral QHS Arayna Illescas, MD       haloperidol  (HALDOL ) tablet 5 mg  5 mg Oral TID PRN Motley-Mangrum, Jadeka A, PMHNP       And   diphenhydrAMINE  (BENADRYL ) capsule 50 mg  50 mg Oral TID PRN Motley-Mangrum, Jadeka A, PMHNP       haloperidol  lactate (HALDOL ) injection 10 mg  10 mg Intramuscular TID PRN Domnique Vanegas, MD       And   diphenhydrAMINE  (BENADRYL ) injection 50 mg  50 mg Intramuscular TID  PRN Alanea Woolridge, MD       And   LORazepam  (ATIVAN ) tablet 2 mg  2 mg Oral TID PRN Cylis Ayars, MD       haloperidol  lactate (HALDOL ) injection 5 mg  5 mg Intramuscular TID PRN Visente Kirker, MD       And   diphenhydrAMINE  (BENADRYL ) injection 50 mg  50 mg Intramuscular TID PRN Robbi Scurlock, MD       And   LORazepam  (ATIVAN ) tablet 2 mg  2 mg Oral TID PRN Kolbey Teichert, MD       feeding supplement (ENSURE PLUS HIGH PROTEIN) liquid 237 mL  237 mL Oral BID BM  Teniya Filter, MD   237 mL at 07/20/24 1034   hydrOXYzine  (ATARAX ) tablet 25 mg  25 mg Oral TID PRN Motley-Mangrum, Jadeka A, PMHNP   25 mg at 07/18/24 0046   levothyroxine  (SYNTHROID ) tablet 137 mcg  137 mcg Oral Q0600 Motley-Mangrum, Jadeka A, PMHNP   137 mcg at 07/24/24 0700   magnesium  hydroxide (MILK OF MAGNESIA) suspension 30 mL  30 mL Oral Daily PRN Motley-Mangrum, Jadeka A, PMHNP   30 mL at 07/19/24 2203   OLANZapine  zydis (ZYPREXA ) disintegrating tablet 2.5 mg  2.5 mg Oral Q8H PRN Motley-Mangrum, Jadeka A, PMHNP   2.5 mg at 07/17/24 0617   sertraline  (ZOLOFT ) tablet 50 mg  50 mg Oral Daily Daleyssa Loiselle, MD   50 mg at 07/24/24 1008   traZODone  (DESYREL ) tablet 50 mg  50 mg Oral QHS PRN Motley-Mangrum, Jadeka A, PMHNP   50 mg at 07/23/24 2247    Lab Results: No results found for this or any previous visit (from the past 48 hours).  Blood Alcohol level:  Lab Results  Component Value Date   [facility] <15 07/13/2024   ETH <15 05/14/2024    Metabolic Disorder Labs: Lab Results  Component Value Date   HGBA1C 5.1 06/07/2022   MPG 100 06/07/2022   No results found for: PROLACTIN Lab Results  Component Value Date   CHOL 309 (H) 06/07/2022   TRIG 62 06/07/2022   HDL 75 06/07/2022   CHOLHDL 4.1 06/07/2022   VLDL 12 06/07/2022   LDLCALC 222 (H) 06/07/2022   LDLCALC 125 (H) 11/27/2021    Physical Findings: AIMS:  , ,  ,  ,    CIWA:    COWS:      Psychiatric Specialty Exam:  Presentation  General  Appearance:  Appropriate for Environment; Casual  Eye Contact: Fair  Speech: Slow  Speech Volume: Normal    Mood and Affect  Mood: Anxious; Depressed  Affect: Depressed; Flat   Thought Process  Thought Processes: Irrevelant  Descriptions of Associations:Intact  Orientation:Partial  Thought Content:Illogical  Hallucinations: Denies  Ideas of Reference:Paranoia  Suicidal Thoughts: Denies Homicidal Thoughts: Denies   Sensorium  Memory: Immediate Fair; Recent Fair; Remote Poor  Judgment: Impaired  Insight: Shallow   Executive Functions  Concentration: Poor  Attention Span: Poor  Recall: Poor  Fund of Knowledge: Fair  Language: Fair   Psychomotor Activity  Psychomotor Activity: No data recorded  Musculoskeletal: Strength & Muscle Tone: within normal limits Gait & Station: normal Assets  Assets: Manufacturing systems engineer; Desire for Improvement; Resilience; Social Support    Physical Exam: Physical Exam Vitals and nursing note reviewed.  HENT:     Head: Normocephalic.     Nose: Nose normal.  Cardiovascular:     Rate and Rhythm: Normal rate.  Pulmonary:     Effort: Pulmonary effort is normal.  Neurological:     Mental Status: She is alert.    Review of Systems  Constitutional: Negative.   HENT: Negative.    Eyes: Negative.   Cardiovascular: Negative.   Skin: Negative.    Blood pressure 122/73, pulse 64, temperature 98.2 F (36.8 C), resp. rate 18, height 5' 8 (1.727 m), weight 66 kg, SpO2 98%. Body mass index is 22.12 kg/m.  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  Patient has stopped all her medications and has not followed up with any outpatient psychiatry.  Patient needs close monitoring and medication management during this inpatient hospitalization.   Treatment Plan Summary:   Safety and Monitoring:             -- Voluntary admission to inpatient psychiatric unit for safety, stabilization and treatment             -- Daily contact with patient to assess and evaluate symptoms and progress in treatment             -- Patient's case to be discussed in multi-disciplinary team meeting             -- Observation Level: q15 minute checks             -- Vital signs:  q12 hours             -- Precautions: suicide, elopement, and assault   2. Psychiatric Diagnoses and Treatment:  Patient is declining increased dose of Klonopin  and is requesting to take only Klonopin  0.5 mg nightly Zoloft  50 mg daily  Abilify  to 7.5 mg daily to help with paranoia   -- The risks/benefits/side-effects/alternatives to this medication were discussed in detail with the patient and time was given for questions. The patient consents to medication trial.                -- Metabolic profile and EKG monitoring obtained while on an atypical antipsychotic (BMI: Lipid Panel: HbgA1c: QTc:)              -- Encouraged patient to participate in unit milieu and in scheduled group therapies   4. Discharge Planning:   -- Social work and case management to assist with discharge planning and identification of hospital follow-up needs prior to discharge  -- Estimated LOS: 3-4 days  Kyrus Hyde, MD 07/24/2024, 3:20 PM

## 2024-07-24 NOTE — Plan of Care (Signed)
   Problem: Education: Goal: Ability to state activities that reduce stress will improve Outcome: Progressing   Problem: Coping: Goal: Ability to identify and develop effective coping behavior will improve Outcome: Progressing   Problem: Self-Concept: Goal: Ability to identify factors that promote anxiety will improve Outcome: Progressing

## 2024-07-24 NOTE — Plan of Care (Signed)
  Problem: Coping: Goal: Ability to identify and develop effective coping behavior will improve Outcome: Progressing   Problem: Self-Concept: Goal: Ability to identify factors that promote anxiety will improve Outcome: Progressing   

## 2024-07-24 NOTE — Progress Notes (Signed)
 Pt is disorganized and confused. Visible on the unit. Interacting with peers. Attends groups. Pt is slow to responding to questions when asked. Po med compliant. PRN given for insomnia. Denies SI/HI/AVH. No c/o pain/discomfort noted.      07/23/24 2100  Psych Admission Type (Psych Patients Only)  Admission Status Involuntary  Psychosocial Assessment  Patient Complaints Anxiety  Eye Contact Fair  Facial Expression Anxious  Affect Anxious  Speech Soft  Interaction Minimal  Motor Activity Fidgety  Appearance/Hygiene In scrubs  Behavior Characteristics Anxious  Mood Anxious  Thought Process  Coherency Disorganized  Content Preoccupation  Delusions None reported or observed  Perception WDL  Hallucination None reported or observed  Judgment Impaired  Confusion Moderate  Danger to Self  Current suicidal ideation? Denies

## 2024-07-24 NOTE — Group Note (Signed)
 Date:  07/24/2024 Time:  11:38 AM  Group Topic/Focus:  Rediscovering Joy:   The focus of this group is to explore various ways to relieve stress in a positive manner.    Participation Level:  Active  Participation Quality:  Appropriate  Affect:  Appropriate  Cognitive:  Appropriate  Insight: Appropriate  Engagement in Group:  Engaged  Modes of Intervention:  Activity  Additional Comments:    Camellia HERO Buffi Ewton 07/24/2024, 11:38 AM

## 2024-07-24 NOTE — Progress Notes (Signed)
   07/24/24 1400  Psych Admission Type (Psych Patients Only)  Admission Status Involuntary  Psychosocial Assessment  Patient Complaints Suspiciousness  Eye Contact Fair  Facial Expression Worried  Affect Depressed  Speech Soft  Interaction Minimal  Motor Activity Fidgety  Appearance/Hygiene In scrubs  Behavior Characteristics Cooperative  Mood Suspicious  Thought Process  Coherency Disorganized  Content Preoccupation  Delusions None reported or observed  Perception WDL  Hallucination None reported or observed  Judgment Impaired  Confusion Moderate  Danger to Self  Current suicidal ideation? Denies  Danger to Others  Danger to Others None reported or observed

## 2024-07-25 DIAGNOSIS — F411 Generalized anxiety disorder: Secondary | ICD-10-CM | POA: Diagnosis not present

## 2024-07-25 DIAGNOSIS — F329 Major depressive disorder, single episode, unspecified: Secondary | ICD-10-CM | POA: Diagnosis not present

## 2024-07-25 NOTE — Group Note (Signed)
 Date:  07/25/2024 Time:  10:55 PM  Group Topic/Focus:  Dimensions of Wellness:   The focus of this group is to introduce the topic of wellness and discuss the role each dimension of wellness plays in total health. Emotional Education:   The focus of this group is to discuss what feelings/emotions are, and how they are experienced. Managing Feelings:   The focus of this group is to identify what feelings patients have difficulty handling and develop a plan to handle them in a healthier way upon discharge.    Participation Level:  Active  Participation Quality:  Appropriate and Attentive  Affect:  Appropriate  Cognitive:  Alert and Appropriate  Insight: Appropriate and Good  Engagement in Group:  Engaged and Supportive  Modes of Intervention:  Discussion, Socialization, and Support  Additional Comments:  Pt did attend group. Will continue to motivate pt to keep attending group with peers.  Brad GORMAN Ryder 07/25/2024, 10:55 PM

## 2024-07-25 NOTE — Group Note (Signed)
 Date:  07/25/2024 Time:  12:10 PM  Group Topic/Focus:  Making Healthy Choices:   The focus of this group is to help patients identify negative/unhealthy choices they were using prior to admission and identify positive/healthier coping strategies to replace them upon discharge.    Participation Level:  Active  Participation Quality:  Appropriate and Inattentive  Affect:  Flat  Cognitive:  Confused  Insight: Improving  Engagement in Group:  Limited and Poor  Modes of Intervention:  Activity  Additional Comments:  n/a  Alyssa [name] 07/25/2024, 12:10 PM

## 2024-07-25 NOTE — Progress Notes (Signed)
 [facility] MD Progress Note  07/25/2024 7:51 PM Alyssa [name]  MRN:  [id number] Alyssa [name] is a 73 y.o. female admitted: Presented to the ED on 05/14/2024  1:26 PM for  having episodes non stop hours of pacing, fidgeting with hands, saying words that do not make sense, and paranoia. 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 She meets criteria for inpatient psychiatric admission.Patient is admitted to [facility] unit with Q15 min safety monitoring. Multidisciplinary team approach is offered. Medication management; group/milieu therapy is offered.   Subjective:  Chart reviewed, case discussed in multidisciplinary meeting, patient seen during rounds.  Patient reports that she is feeling better.  She wanted her sleep medication trazodone  to be changed to be standing and wanted a prescription for that when she gets discharged she is scheduled to be possibly getting discharged on Monday denies any current suicidal and homicidal thoughts.    Sleep: Fair  Appetite:  Fair  Past Psychiatric History: see h&P Family History:  Family History  Problem Relation Age of Onset   Hypertension Sister    Thyroid  cancer Brother    Skin cancer Brother    Diabetes Paternal Grandmother    Colon cancer Neg Hx    Breast cancer Neg Hx    Pancreatic cancer Neg Hx    Stomach cancer Neg Hx    Liver cancer Neg Hx    Esophageal cancer Neg Hx    Social History:  Social History   Substance and Sexual Activity  Alcohol Use Yes   Comment: Rare     Social History   Substance and Sexual Activity  Drug Use  Never    Social History   Socioeconomic History   Marital status: Married    Spouse name: Not on file   Number of children: 0   Years of education: Not on file   Highest education level: Master's degree (e.g., MA, MS, MEng, MEd, MSW, MBA)  Occupational History   Occupation: retired, Chief Financial Officer  Tobacco Use   Smoking status: Never   Smokeless tobacco: Never  Vaping Use   Vaping status: Never Used  Substance and Sexual Activity   Alcohol use: Yes    Comment: Rare   Drug use: Never   Sexual activity: Not Currently  Other Topics Concern   Not on file  Social History Narrative   Moved from [place] June 2022   Lives w/ husband   Moved to stay close to her sister (Mrs Andra)   Social Drivers of Health   Financial Resource Strain: Low Risk  (01/27/2023)   Received from [facility]   Overall Financial Resource Strain (CARDIA)    Difficulty of Paying Living Expenses: Not hard at all  Food Insecurity: Patient Declined (07/14/2024)   Hunger Vital Sign    Worried About Running Out of Food in the Last Year: Patient declined    Ran Out of Food in the Last Year: Patient declined  Transportation Needs: Patient Declined (07/14/2024)   PRAPARE - Transportation    Lack of Transportation (Medical): Patient declined    Lack of Transportation (Non-Medical): Patient declined  Physical Activity: Sufficiently Active (01/27/2023)   Received from [facility]  Exercise Vital Sign    On average, how many days per week do you engage in moderate to strenuous exercise (like a brisk walk)?: 5 days    On average, how many minutes do you engage in exercise at this level?: 30 min  Stress: No Stress Concern Present (01/27/2023)   Received from [facility] - Occupational Stress Questionnaire    Feeling of Stress : Only a little  Social Connections: Moderately Isolated (07/14/2024)   Social Connection and Isolation Panel    Frequency of Communication with  Friends and Family: Three times a week    Frequency of Social Gatherings with Friends and Family: Once a week    Attends Religious Services: Never    Database administrator or Organizations: No    Attends Engineer, structural: Never    Marital Status: Married   Past Medical History:  Past Medical History:  Diagnosis Date   Anxiety and depression    Arthritis    Cervicogenic headache    Claustrophobia    Glaucoma    Hypertension    Lumbar radiculopathy    Postsurgical hypothyroidism    Spinal stenosis    Thyroid  cancer (HCC)    s/p thyroidectomy    Past Surgical History:  Procedure Laterality Date   FOOT SURGERY     LAMINOTOMY  04/03/2021   LEFT L3/4 LAMINOTOMY BILATERAL L3/4 MEDIAL FACETECTOMY   LUMBAR LAMINECTOMY  03/03/2018   THYROIDECTOMY  12/2004    Current Medications: Current Facility-Administered Medications  Medication Dose Route Frequency Provider Last Rate Last Admin   acetaminophen  (TYLENOL ) tablet 650 mg  650 mg Oral Q6H PRN Motley-Mangrum, Jadeka A, PMHNP       alum & mag hydroxide-simeth (MAALOX/MYLANTA) 200-200-20 MG/5ML suspension 30 mL  30 mL Oral Q4H PRN Motley-Mangrum, Jadeka A, PMHNP       ARIPiprazole  (ABILIFY ) tablet 7.5 mg  7.5 mg Oral Daily Jadapalle, Sree, MD   7.5 mg at 07/25/24 1027   clonazePAM  (KLONOPIN ) disintegrating tablet 0.5 mg  0.5 mg Oral QHS Jadapalle, Sree, MD   0.5 mg at 07/24/24 2132   haloperidol  (HALDOL ) tablet 5 mg  5 mg Oral TID PRN Motley-Mangrum, Jadeka A, PMHNP       And   diphenhydrAMINE  (BENADRYL ) capsule 50 mg  50 mg Oral TID PRN Motley-Mangrum, Jadeka A, PMHNP       haloperidol  lactate (HALDOL ) injection 10 mg  10 mg Intramuscular TID PRN Jadapalle, Sree, MD       And   diphenhydrAMINE  (BENADRYL ) injection 50 mg  50 mg Intramuscular TID PRN Jadapalle, Sree, MD       And   LORazepam  (ATIVAN ) tablet 2 mg  2 mg Oral TID PRN Jadapalle, Sree, MD       haloperidol  lactate (HALDOL ) injection 5 mg  5 mg Intramuscular  TID PRN Jadapalle, Sree, MD       And   diphenhydrAMINE  (BENADRYL ) injection 50 mg  50 mg Intramuscular TID PRN Jadapalle, Sree, MD       And   LORazepam  (ATIVAN ) tablet 2 mg  2 mg Oral TID PRN Jadapalle, Sree, MD       feeding supplement (ENSURE PLUS HIGH PROTEIN) liquid 237 mL  237 mL Oral BID BM Jadapalle, Sree, MD   237 mL at 07/20/24 1034   hydrOXYzine  (ATARAX ) tablet 25 mg  25 mg Oral TID PRN Motley-Mangrum, Jadeka A, PMHNP   25 mg at 07/18/24 0046  levothyroxine  (SYNTHROID ) tablet 137 mcg  137 mcg Oral Q0600 Motley-Mangrum, Jadeka A, PMHNP   137 mcg at 07/25/24 0606   magnesium  hydroxide (MILK OF MAGNESIA) suspension 30 mL  30 mL Oral Daily PRN Motley-Mangrum, Jadeka A, PMHNP   30 mL at 07/19/24 2203   OLANZapine  zydis (ZYPREXA ) disintegrating tablet 2.5 mg  2.5 mg Oral Q8H PRN Motley-Mangrum, Jadeka A, PMHNP   2.5 mg at 07/17/24 0617   sertraline  (ZOLOFT ) tablet 50 mg  50 mg Oral Daily Jadapalle, Sree, MD   50 mg at 07/25/24 1027   traZODone  (DESYREL ) tablet 50 mg  50 mg Oral QHS PRN Motley-Mangrum, Jadeka A, PMHNP   50 mg at 07/24/24 2132    Lab Results: No results found for this or any previous visit (from the past 48 hours).  Blood Alcohol level:  Lab Results  Component Value Date   [facility] <15 07/13/2024   ETH <15 05/14/2024    Metabolic Disorder Labs: Lab Results  Component Value Date   HGBA1C 5.1 06/07/2022   MPG 100 06/07/2022   No results found for: PROLACTIN Lab Results  Component Value Date   CHOL 309 (H) 06/07/2022   TRIG 62 06/07/2022   HDL 75 06/07/2022   CHOLHDL 4.1 06/07/2022   VLDL 12 06/07/2022   LDLCALC 222 (H) 06/07/2022   LDLCALC 125 (H) 11/27/2021    Physical Findings: AIMS:  , ,  ,  ,    CIWA:    COWS:      Psychiatric Specialty Exam:     Patient is a 73 year old female who appears stated age alert oriented x3 mood is okay affect is constricted thought process logical and coherent denies any suicidal homicidal thoughts no perceptual  disturbances insight and judgment fair.   Psychomotor Activity  Psychomotor Activity: No data recorded  Musculoskeletal: Strength & Muscle Tone: within normal limits Gait & Station: normal Assets  Assets: Manufacturing systems engineer; Desire for Improvement; Resilience; Social Support    Physical Exam: Physical Exam Vitals and nursing note reviewed.  HENT:     Head: Normocephalic.     Nose: Nose normal.  Cardiovascular:     Rate and Rhythm: Normal rate.  Pulmonary:     Effort: Pulmonary effort is normal.  Neurological:     Mental Status: She is alert.    Review of Systems  Constitutional: Negative.   HENT: Negative.    Eyes: Negative.   Cardiovascular: Negative.   Skin: Negative.    Blood pressure 119/70, pulse 63, temperature (!) 97.2 F (36.2 C), resp. rate 16, height 5' 8 (1.727 m), weight 66 kg, SpO2 99%. Body mass index is 22.12 kg/m.  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  Patient has stopped all her medications and has not followed up with any outpatient psychiatry.  Patient needs close monitoring and medication management during this inpatient hospitalization.   Treatment Plan Summary:   Safety and Monitoring:             -- Voluntary admission to inpatient psychiatric unit for safety, stabilization and treatment             -- Daily contact with patient to assess and evaluate symptoms  and progress in treatment             -- Patient's case to be discussed in multi-disciplinary team meeting             -- Observation Level: q15 minute checks             -- Vital  signs:  q12 hours             -- Precautions: suicide, elopement, and assault   2. Psychiatric Diagnoses and Treatment:  Patient is declining increased dose of Klonopin  and is requesting to take only Klonopin  0.5 mg nightly Zoloft  50 mg daily  Abilify  to 7.5 mg daily to help with paranoia   -- The risks/benefits/side-effects/alternatives to this medication were discussed in detail with the patient and time was given for questions. The patient consents to medication trial.                -- Metabolic profile and EKG monitoring obtained while on an atypical antipsychotic (BMI: Lipid Panel: HbgA1c: QTc:)              -- Encouraged patient to participate in unit milieu and in scheduled group therapies   4. Discharge Planning:   -- Social work and case management to assist with discharge planning and identification of hospital follow-up needs prior to discharge  -- Estimated LOS: 3-4 days  Millie JONELLE Manners, MD 07/25/2024, 7:51 PM

## 2024-07-25 NOTE — BH IP Treatment Plan (Signed)
 Interdisciplinary Treatment and Diagnostic Plan Update  07/25/2024 Time of Session: 9:41am Alyssa [name] MRN: [id number]  Principal Diagnosis: MDD (major depressive disorder)  Secondary Diagnoses: Principal Problem:   MDD (major depressive disorder)   Current Medications:  Current Facility-Administered Medications  Medication Dose Route Frequency Provider Last Rate Last Admin   acetaminophen  (TYLENOL ) tablet 650 mg  650 mg Oral Q6H PRN Motley-Mangrum, Jadeka A, PMHNP       alum & mag hydroxide-simeth (MAALOX/MYLANTA) 200-200-20 MG/5ML suspension 30 mL  30 mL Oral Q4H PRN Motley-Mangrum, Jadeka A, PMHNP       ARIPiprazole  (ABILIFY ) tablet 7.5 mg  7.5 mg Oral Daily Jadapalle, Sree, MD   7.5 mg at 07/24/24 1008   clonazePAM  (KLONOPIN ) disintegrating tablet 0.5 mg  0.5 mg Oral QHS Jadapalle, Sree, MD   0.5 mg at 07/24/24 2132   haloperidol  (HALDOL ) tablet 5 mg  5 mg Oral TID PRN Motley-Mangrum, Jadeka A, PMHNP       And   diphenhydrAMINE  (BENADRYL ) capsule 50 mg  50 mg Oral TID PRN Motley-Mangrum, Jadeka A, PMHNP       haloperidol  lactate (HALDOL ) injection 10 mg  10 mg Intramuscular TID PRN Jadapalle, Sree, MD       And   diphenhydrAMINE  (BENADRYL ) injection 50 mg  50 mg Intramuscular TID PRN Jadapalle, Sree, MD       And   LORazepam  (ATIVAN ) tablet 2 mg  2 mg Oral TID PRN Jadapalle, Sree, MD       haloperidol  lactate (HALDOL ) injection 5 mg  5 mg Intramuscular TID PRN Jadapalle, Sree, MD       And   diphenhydrAMINE  (BENADRYL ) injection 50 mg  50 mg Intramuscular TID PRN Jadapalle, Sree, MD       And   LORazepam  (ATIVAN ) tablet 2 mg  2 mg Oral TID PRN Jadapalle, Sree, MD       feeding supplement (ENSURE PLUS HIGH PROTEIN) liquid 237 mL  237 mL Oral BID BM Jadapalle, Sree, MD   237 mL at 07/20/24 1034   hydrOXYzine  (ATARAX ) tablet 25 mg  25 mg Oral TID PRN Motley-Mangrum, Jadeka A, PMHNP   25 mg at 07/18/24 0046   levothyroxine  (SYNTHROID ) tablet 137 mcg  137 mcg Oral Q0600  Motley-Mangrum, Jadeka A, PMHNP   137 mcg at 07/25/24 0606   magnesium  hydroxide (MILK OF MAGNESIA) suspension 30 mL  30 mL Oral Daily PRN Motley-Mangrum, Jadeka A, PMHNP   30 mL at 07/19/24 2203   OLANZapine  zydis (ZYPREXA ) disintegrating tablet 2.5 mg  2.5 mg Oral Q8H PRN Motley-Mangrum, Jadeka A, PMHNP   2.5 mg at 07/17/24 0617   sertraline  (ZOLOFT ) tablet 50 mg  50 mg Oral Daily Jadapalle, Sree, MD   50 mg at 07/24/24 1008   traZODone  (DESYREL ) tablet 50 mg  50 mg Oral QHS PRN Motley-Mangrum, Jadeka A, PMHNP   50 mg at 07/24/24 2132   PTA Medications: Medications Prior to Admission  Medication Sig Dispense Refill Last Dose/Taking   clonazePAM  (KLONOPIN ) 0.5 MG disintegrating tablet Take 1 tablet (0.5 mg total) by mouth 2 (two) times daily. (Patient not taking: Reported on 07/13/2024) 60 tablet 0    levothyroxine  (SYNTHROID ) 137 MCG tablet Take 1 tablet (137 mcg total) by mouth daily at 6 (six) AM. 30 tablet 0    sertraline  (ZOLOFT ) 25 MG tablet Take 1 tablet (25 mg total) by mouth daily. (Patient not taking: Reported on 07/13/2024) 30 tablet 0    Travoprost, BAK Free, (TRAVATAN) 0.004 %  SOLN ophthalmic solution Place 1 drop into both eyes at bedtime.       Patient Stressors:    Patient Strengths:    Treatment Modalities: Medication Management, Group therapy, Case management,  1 to 1 session with clinician, Psychoeducation, Recreational therapy.   Physician Treatment Plan for Primary Diagnosis: MDD (major depressive disorder) Long Term Goal(s): Improvement in symptoms so as ready for discharge   Short Term Goals: Ability to identify changes in lifestyle to reduce recurrence of condition will improve Ability to verbalize feelings will improve Ability to disclose and discuss suicidal ideas Ability to demonstrate self-control will improve Ability to identify and develop effective coping behaviors will improve  Medication Management: Evaluate patient's response, side effects, and tolerance  of medication regimen.  Therapeutic Interventions: 1 to 1 sessions, Unit Group sessions and Medication administration.  Evaluation of Outcomes: Progressing  Physician Treatment Plan for Secondary Diagnosis: Principal Problem:   MDD (major depressive disorder)  Long Term Goal(s): Improvement in symptoms so as ready for discharge   Short Term Goals: Ability to identify changes in lifestyle to reduce recurrence of condition will improve Ability to verbalize feelings will improve Ability to disclose and discuss suicidal ideas Ability to demonstrate self-control will improve Ability to identify and develop effective coping behaviors will improve     Medication Management: Evaluate patient's response, side effects, and tolerance of medication regimen.  Therapeutic Interventions: 1 to 1 sessions, Unit Group sessions and Medication administration.  Evaluation of Outcomes: Progressing   RN Treatment Plan for Primary Diagnosis: MDD (major depressive disorder) Long Term Goal(s): Knowledge of disease and therapeutic regimen to maintain health will improve  Short Term Goals: Ability to remain free from injury will improve, Ability to verbalize frustration and anger appropriately will improve, Ability to demonstrate self-control, Ability to participate in decision making will improve, Ability to verbalize feelings will improve, Ability to disclose and discuss suicidal ideas, Ability to identify and develop effective coping behaviors will improve, and Compliance with prescribed medications will improve  Medication Management: RN will administer medications as ordered by provider, will assess and evaluate patient's response and provide education to patient for prescribed medication. RN will report any adverse and/or side effects to prescribing provider.  Therapeutic Interventions: 1 on 1 counseling sessions, Psychoeducation, Medication administration, Evaluate responses to treatment, Monitor vital  signs and CBGs as ordered, Perform/monitor CIWA, COWS, AIMS and Fall Risk screenings as ordered, Perform wound care treatments as ordered.  Evaluation of Outcomes: Progressing   LCSW Treatment Plan for Primary Diagnosis: MDD (major depressive disorder) Long Term Goal(s): Safe transition to appropriate next level of care at discharge, Engage patient in therapeutic group addressing interpersonal concerns.  Short Term Goals: Engage patient in aftercare planning with referrals and resources, Increase social support, Increase ability to appropriately verbalize feelings, Increase emotional regulation, Facilitate acceptance of mental health diagnosis and concerns, Facilitate patient progression through stages of change regarding substance use diagnoses and concerns, Identify triggers associated with mental health/substance abuse issues, and Increase skills for wellness and recovery  Therapeutic Interventions: Assess for all discharge needs, 1 to 1 time with Social worker, Explore available resources and support systems, Assess for adequacy in community support network, Educate family and significant other(s) on suicide prevention, Complete Psychosocial Assessment, Interpersonal group therapy.  Evaluation of Outcomes: Progressing   Progress in Treatment: Attending groups: Yes. Participating in groups: Yes. Taking medication as prescribed: Yes. Toleration medication: Yes. Family/Significant other contact made: Yes, individual(s) contacted:  SPE completed with [name] Sayre, spouse [phone])  on 07/17/2024 Patient understands diagnosis: Yes. Discussing patient identified problems/goals with staff: Yes. Medical problems stabilized or resolved: Yes. Denies suicidal/homicidal ideation: Yes. Issues/concerns per patient self-inventory: No. Other:   New problem(s) identified: No, Describe:  None identified Update 07/20/24: No changes at this time. Update: 07/25/2024 No changes at this time.    New Short  Term/Long Term Goal(s): elimination of symptoms of psychosis, medication management for mood stabilization; elimination of SI thoughts; development of comprehensive mental wellness plan. Update 07/20/24: No changes at this time. Update: 07/25/2024 No changes at this time.    Patient Goals:  I want to feel better Update 07/20/24: No changes at this time. Update: 07/25/2024 No changes at this time.    Discharge Plan or Barriers: CSW will assist with appropriate discharge planning Update 07/20/24: Pt presenting with cognitive declines, unable to assess for discharge date at this time. Pt recommended to go neurology on an outpatient basis at this time per provider.SABRA Update: 07/25/2024 No changes at this time.    Reason for Continuation of Hospitalization: Depression Medication stabilization   Estimated Length of Stay: 1 to 7 days Update 07/20/24: TBD. Update: 07/25/2024: Possible d/c 8/18  Last 3 [place] Suicide Severity Risk Score: Flowsheet Row Admission (Current) from 07/14/2024 in [facility] from 07/13/2024 in [facility] at [facility] ED from 07/10/2024 in [facility] Emergency Department at [facility]  C-SSRS RISK CATEGORY No Risk No Risk No Risk    Last [facility] 2/9 Scores:    05/29/2024   10:26 AM 09/07/2022   11:23 AM 06/21/2021    3:28 PM  Depression screen PHQ 2/9  Decreased Interest 1 1 0  Down, Depressed, Hopeless 1 1 0  PHQ - 2 Score 2 2 0  Altered sleeping 1 1 0  Tired, decreased energy 1 1 0  Change in appetite 1 0 0  Feeling bad or failure about yourself  1 1 0  Trouble concentrating 0 1 0  Moving slowly or fidgety/restless 1 1 0  Suicidal thoughts 0 0 0  PHQ-9 Score 7 7 0  Difficult doing work/chores Somewhat difficult Somewhat difficult     Scribe for Treatment Team: Aldo CHRISTELLA Niece, KEN 07/25/2024 9:41 AM

## 2024-07-25 NOTE — Progress Notes (Signed)
   07/25/24 0800  Psych Admission Type (Psych Patients Only)  Admission Status Involuntary  Psychosocial Assessment  Patient Complaints Suspiciousness  Eye Contact Fair  Facial Expression Worried  Affect Depressed  Speech Soft  Interaction Minimal  Motor Activity Slow  Appearance/Hygiene In scrubs  Behavior Characteristics Cooperative  Mood Suspicious  Thought Process  Coherency Disorganized  Content Preoccupation  Delusions None reported or observed  Perception WDL  Hallucination None reported or observed  Judgment Impaired  Confusion Moderate  Danger to Self  Current suicidal ideation? Denies  Danger to Others  Danger to Others None reported or observed

## 2024-07-25 NOTE — Plan of Care (Signed)
  Problem: Health Behavior/Discharge Planning: Goal: Identification of resources available to assist in meeting health care needs will improve Outcome: Progressing   Problem: Medication: Goal: Compliance with prescribed medication regimen will improve Outcome: Progressing

## 2024-07-25 NOTE — Progress Notes (Signed)
   07/25/24 0525  OTHER  Documented sleep last 24 hours 7.75  Sleep (Behavioral Health Patients Only)  Calculate sleep? (Click Yes once per 24 hr at 0600 safety check) Yes   Patient complaint with treatment interacting well with Peers and Staff visible in Dayroom this shift. Denies SI/HI/A/VH and verbally contracts for safety. PRN Trazodone  50 mg given for sleep and effective. Support and encouragement provided as needed.

## 2024-07-26 NOTE — Progress Notes (Signed)
   07/26/24 0000  Psych Admission Type (Psych Patients Only)  Admission Status Involuntary  Psychosocial Assessment  Patient Complaints Suspiciousness  Eye Contact Fair  Facial Expression Worried  Affect Depressed  Speech Soft  Interaction Minimal  Motor Activity Slow  Appearance/Hygiene In scrubs  Behavior Characteristics Cooperative  Mood Suspicious  Thought Process  Coherency Disorganized  Content Preoccupation  Delusions None reported or observed  Perception WDL  Hallucination None reported or observed  Judgment Impaired  Confusion Moderate  Danger to Self  Current suicidal ideation? Denies  Danger to Others  Danger to Others None reported or observed   Pt mildly confused, denies pain, SI/HI/AVH, participated in the milieu.  Received all medications as scheduled to include PRN Trazadone with effective results.

## 2024-07-26 NOTE — Group Note (Signed)
 Date:  07/26/2024 Time:  1:04 PM  Group Topic/Focus:  Making Healthy Choices:   The focus of this group is to help patients identify negative/unhealthy choices they were using prior to admission and identify positive/healthier coping strategies to replace them upon discharge.    Participation Level:  Active  Participation Quality:  Appropriate  Affect:  Appropriate  Cognitive:  Appropriate  Insight: Appropriate  Engagement in Group:  Engaged  Modes of Intervention:  Discussion  Additional Comments:  N/A  Harlene LITTIE Gavel 07/26/2024, 1:04 PM

## 2024-07-26 NOTE — Progress Notes (Signed)
   07/26/24 1200  Psych Admission Type (Psych Patients Only)  Admission Status Involuntary  Psychosocial Assessment  Patient Complaints Suspiciousness  Eye Contact Fair  Facial Expression Blank  Affect Appropriate to circumstance  Speech Soft  Interaction Minimal  Motor Activity Slow  Appearance/Hygiene In scrubs  Behavior Characteristics Cooperative  Mood Suspicious  Thought Process  Coherency WDL  Content WDL  Delusions None reported or observed  Perception WDL  Hallucination None reported or observed  Judgment Impaired  Confusion Mild  Danger to Self  Current suicidal ideation? Denies  Danger to Others  Danger to Others None reported or observed

## 2024-07-26 NOTE — Plan of Care (Signed)
  Problem: Education: Goal: Ability to state activities that reduce stress will improve Outcome: Adequate for Discharge   Problem: Coping: Goal: Ability to identify and develop effective coping behavior will improve Outcome: Adequate for Discharge   Problem: Self-Concept: Goal: Ability to identify factors that promote anxiety will improve Outcome: Adequate for Discharge Goal: Level of anxiety will decrease Outcome: Adequate for Discharge Goal: Ability to modify response to factors that promote anxiety will improve Outcome: Adequate for Discharge   Problem: Education: Goal: Utilization of techniques to improve thought processes will improve Outcome: Adequate for Discharge Goal: Knowledge of the prescribed therapeutic regimen will improve Outcome: Adequate for Discharge

## 2024-07-26 NOTE — Group Note (Signed)
 Date:  07/26/2024 Time:  9:44 PM  Group Topic/Focus:  Wrap-Up Group:   The focus of this group is to help patients review their daily goal of treatment and discuss progress on daily workbooks.    Participation Level:  Active  Participation Quality:  Appropriate  Affect:  Appropriate  Cognitive:  Alert  Insight: Appropriate  Engagement in Group:  Engaged  Modes of Intervention:  Discussion  Additional Comments:    Alyssa [name] 07/26/2024, 9:44 PM

## 2024-07-26 NOTE — Plan of Care (Signed)
  Problem: Self-Concept: Goal: Ability to identify factors that promote anxiety will improve Outcome: Progressing Goal: Level of anxiety will decrease Outcome: Progressing

## 2024-07-27 DIAGNOSIS — F329 Major depressive disorder, single episode, unspecified: Secondary | ICD-10-CM | POA: Diagnosis not present

## 2024-07-27 MED ORDER — CLONAZEPAM 0.5 MG PO TBDP: 0.5000 mg | ORAL_TABLET | Freq: Every day | ORAL | 0 refills | Status: DC

## 2024-07-27 MED ORDER — CLONAZEPAM 0.5 MG PO TBDP: 0.5000 mg | ORAL_TABLET | Freq: Every day | ORAL | 1 refills | Status: DC

## 2024-07-27 MED ORDER — ARIPIPRAZOLE 15 MG PO TABS: 7.5000 mg | ORAL_TABLET | Freq: Every day | ORAL | 1 refills | Status: AC

## 2024-07-27 MED ORDER — ARIPIPRAZOLE 15 MG PO TABS: 7.5000 mg | ORAL_TABLET | Freq: Every day | ORAL | 0 refills | Status: DC

## 2024-07-27 MED ORDER — SERTRALINE HCL 50 MG PO TABS: 50.0000 mg | ORAL_TABLET | Freq: Every day | ORAL | 1 refills | Status: AC

## 2024-07-27 MED ORDER — SERTRALINE HCL 50 MG PO TABS: 50.0000 mg | ORAL_TABLET | Freq: Every day | ORAL | 0 refills | Status: DC

## 2024-07-27 MED ORDER — SYNTHROID 137 MCG PO TABS: 137.0000 ug | ORAL_TABLET | Freq: Every day | ORAL | 0 refills | Status: AC

## 2024-07-27 NOTE — BHH Suicide Risk Assessment (Signed)
 [facility] Discharge Suicide Risk Assessment   Principal Problem: MDD (major depressive disorder) Discharge Diagnoses: Principal Problem:   MDD (major depressive disorder)   Total Time spent with patient: 30 minutes  Musculoskeletal: Strength & Muscle Tone: within normal limits Gait & Station: normal Patient leans: N/A  Psychiatric Specialty Exam  Presentation  General Appearance:  Appropriate for Environment; Casual  Eye Contact: Fair  Speech: Clear and Coherent  Speech Volume: Normal  Handedness: Right   Mood and Affect  Mood: Euthymic  Duration of Depression Symptoms: Greater than two weeks  Affect: Appropriate   Thought Process  Thought Processes: Linear  Descriptions of Associations:Intact  Orientation:Partial  Thought Content:Logical  History of Schizophrenia/Schizoaffective disorder:No  Duration of Psychotic Symptoms:N/A  Hallucinations:Hallucinations: None  Ideas of Reference:None  Suicidal Thoughts:Suicidal Thoughts: No  Homicidal Thoughts:Homicidal Thoughts: No   Sensorium  Memory: Immediate Fair; Recent Poor; Remote Poor  Judgment: Fair  Insight: Fair   Chartered certified accountant: Fair  Attention Span: Fair  Recall: [employer] of Knowledge: Fair  Language: Fair   Psychomotor Activity  Psychomotor Activity: Psychomotor Activity: Normal   Assets  Assets: Communication Skills; Desire for Improvement   Sleep  Sleep: Sleep: Fair  Estimated Sleeping Duration (Last 24 Hours): 6.25-7.75 hours  Physical Exam: Physical Exam ROS Blood pressure 121/66, pulse 61, temperature 98.2 F (36.8 C), resp. rate 16, height 5' 8 (1.727 m), weight 66 kg, SpO2 99%. Body mass index is 22.12 kg/m.  Mental Status Per Nursing Assessment::   On Admission:  NA  Demographic Factors:  Caucasian  Loss Factors: Decrease in vocational status  Historical Factors: Impulsivity  Risk Reduction Factors:   Living  with another person, especially a relative, Positive social support, Positive therapeutic relationship, and Positive coping skills or problem solving skills  Continued Clinical Symptoms:  Depression:   Impulsivity  Cognitive Features That Contribute To Risk:  None    Suicide Risk:  Minimal: No identifiable suicidal ideation.  Patients presenting with no risk factors but with morbid ruminations; may be classified as minimal risk based on the severity of the depressive symptoms   Follow-up Information     [facility] - [facility]. Go on 07/29/2024.   Why: You have an appointment scheduled with your PCP for 07/29/24 at 4:15 PM. You will discuss a referral for neurology at this appointment Contact information: [address] [place], [place] [address] [phone]                Plan Of Care/Follow-up recommendations:  Activity:  As tolerated  Navayah Sok, MD 07/27/2024, 10:11 AM

## 2024-07-27 NOTE — Discharge Summary (Signed)
 Physician Discharge Summary Note  Patient:  Alyssa [name] is an 73 y.o., female MRN:  [id number] DOB:  14-Oct-1951 Patient phone:  [phone] (home)  Patient address:   [address] [place] [address],   Total time spent: 40 min Date of Admission:  07/14/2024 Date of Discharge: 07/27/24 Alyssa [name] is a 73 y.o. female admitted: Presented to the ED on 05/14/2024  1:26 PM for  having episodes non stop hours of pacing, fidgeting with hands, saying words that do not make sense, and paranoia. 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 She meets criteria for inpatient psychiatric admission.Patient is admitted to [facility] unit with Q15 min safety monitoring. Multidisciplinary team approach is offered. Medication management; group/milieu therapy is offered.  Reason for Admission:    Principal Problem: MDD (major depressive disorder) Discharge Diagnoses: Principal Problem:   MDD (major depressive disorder)   Past Psychiatric History: see h&p  Family Psychiatric  History: see h&p Social History:  Social History   Substance and Sexual Activity  Alcohol Use Yes   Comment: Rare     Social History   Substance and Sexual Activity  Drug Use Never    Social History   Socioeconomic History   Marital status: Married    Spouse name: Not on file   Number of children: 0   Years of education: Not on file   Highest education level: Master's degree (e.g., MA, MS, MEng, MEd, MSW, MBA)  Occupational History   Occupation: retired, Chief Financial Officer  Tobacco Use   Smoking  status: Never   Smokeless tobacco: Never  Vaping Use   Vaping status: Never Used  Substance and Sexual Activity   Alcohol use: Yes    Comment: Rare   Drug use: Never   Sexual activity: Not Currently  Other Topics Concern   Not on file  Social History Narrative   Moved from [place] June 2022   Lives w/ husband   Moved to stay close to her sister (Mrs Andra)   Social Drivers of Health   Financial Resource Strain: Low Risk  (01/27/2023)   Received from [facility]   Overall Financial Resource Strain (CARDIA)    Difficulty of Paying Living Expenses: Not hard at all  Food Insecurity: Patient Declined (07/14/2024)   Hunger Vital Sign    Worried About Running Out of Food in the Last Year: Patient declined    Ran Out of Food in the Last Year: Patient declined  Transportation Needs: Patient Declined (07/14/2024)   PRAPARE - Administrator, Civil Service (Medical): Patient declined    Lack of Transportation (Non-Medical): Patient declined  Physical Activity: Sufficiently Active (01/27/2023)   Received from [facility]   Exercise Vital Sign    On average, how many days per week do you engage in moderate to strenuous exercise (like a brisk walk)?: 5 days    On average, how many minutes do you engage in exercise at this level?: 30 min  Stress: No Stress Concern Present (01/27/2023)   Received from [facility] - Occupational Stress Questionnaire    Feeling of Stress : Only  a little  Social Connections: Moderately Isolated (07/14/2024)   Social Connection and Isolation Panel    Frequency of Communication with Friends and Family: Three times a week    Frequency of Social Gatherings with Friends and Family: Once a week    Attends Religious Services: Never    Database administrator or Organizations: No    Attends Engineer, structural: Never    Marital Status: Married   Past Medical History:  Past Medical History:   Diagnosis Date   Anxiety and depression    Arthritis    Cervicogenic headache    Claustrophobia    Glaucoma    Hypertension    Lumbar radiculopathy    Postsurgical hypothyroidism    Spinal stenosis    Thyroid  cancer (HCC)    s/p thyroidectomy    Past Surgical History:  Procedure Laterality Date   FOOT SURGERY     LAMINOTOMY  04/03/2021   LEFT L3/4 LAMINOTOMY BILATERAL L3/4 MEDIAL FACETECTOMY   LUMBAR LAMINECTOMY  03/03/2018   THYROIDECTOMY  12/2004   Family History:  Family History  Problem Relation Age of Onset   Hypertension Sister    Thyroid  cancer Brother    Skin cancer Brother    Diabetes Paternal Grandmother    Colon cancer Neg Hx    Breast cancer Neg Hx    Pancreatic cancer Neg Hx    Stomach cancer Neg Hx    Liver cancer Neg Hx    Esophageal cancer Neg Hx     Hospital Course:  Alyssa [name] is a 73 y.o. female admitted: Presented to the ED on 05/14/2024  1:26 PM for  having episodes non stop hours of pacing, fidgeting with hands, saying words that do not make sense, and paranoia. 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 She meets criteria for inpatient psychiatric admission.Patient is admitted to [facility] unit with Q15 min safety monitoring. Multidisciplinary team approach is offered. Medication management; group/milieu therapy is offered.  Detailed risk assessment is complete based on clinical exam and individual risk factors and acute suicide risk is low and acute violence risk is low.     On assessment, patient was restarted on her home medications  which she stopped taking after her last discharge.  Zoloft  was continued and maintained at 50 mg daily.  Her Klonopin  was restarted at 0.25 mg twice daily.  It was titrated up to 0.5 mg twice daily but patient refused to take high doses and requested to keep her only at 0.5 mg nightly.  Provider explained in detail about benzo withdrawal delirium as patient was displaying significant amount of confusion since admission on the unit that includes behaviors of picking from other peoples plates, going into the room of other patients, standing in the hallway and unable to follow the directions.  Patient was started on Abilify  as a mood stabilizer and for paranoia and was titrated up to 7.5 mg daily.  Patient tolerated medications very well and her mood significantly improved and her confusion reduced.  On the day of discharge she consistently denies SI/HI/plan and denies hallucinations.  Treatment team reached out to her husband who expressed no safety concerns  about patient's discharge home.  Treatment team has recommended again for the patient to follow-up with neurology to get evaluated for any underlying neurocognitive issues. Currently, all modifiable risk of harm to self/harm to others have been addressed and patient is no longer appropriate for the acute inpatient setting and is able to continue treatment for mental health needs in the community with the supports as indicated below.  Patient is educated and verbalized understanding of discharge plan of care including medications, follow-up appointments, mental health resources and further crisis services in the community.  He is instructed to call 911 or present to the nearest emergency room should he experience any decompensation in mood, disturbance of bowel or return of suicidal/homicidal ideations.  Patient verbalizes understanding of this education and agrees to this plan of care  Physical Findings: AIMS:  , ,  ,  ,    CIWA:    COWS:         Psychiatric Specialty Exam:  Presentation  General Appearance:  Appropriate for Environment; Casual  Eye Contact: Fair  Speech: Clear and Coherent  Speech Volume: Normal    Mood and Affect  Mood: Euthymic  Affect: Appropriate   Thought Process  Thought Processes: Linear  Descriptions of Associations:Intact  Orientation:Partial  Thought Content:Logical  Hallucinations:Hallucinations: None  Ideas of Reference:None  Suicidal Thoughts:Suicidal Thoughts: No  Homicidal Thoughts:Homicidal Thoughts: No   Sensorium  Memory: Immediate Fair; Recent Poor; Remote Poor  Judgment: Fair  Insight: Fair   Art therapist  Concentration: Fair  Attention Span: Fair  Recall: [employer] of Knowledge: Fair  Language: Fair   Psychomotor Activity  Psychomotor Activity: Psychomotor Activity: Normal  Musculoskeletal: Strength & Muscle Tone: within normal limits Gait & Station: normal Assets  Assets: Manufacturing systems engineer; Desire for Improvement   Sleep  Sleep: Sleep: Fair    Physical Exam: Physical Exam Vitals and nursing note reviewed.    ROS Blood pressure 121/66, pulse 61, temperature 98.2 F (36.8 C), resp. rate 16, height 5' 8 (1.727 m), weight 66 kg, SpO2 99%. Body mass index is 22.12 kg/m.   Social History   Tobacco Use  Smoking Status Never  Smokeless Tobacco Never   Tobacco Cessation:  N/A, patient does not currently use tobacco products   Blood Alcohol level:  Lab Results  Component Value Date   [facility] <15 07/13/2024   ETH <15 05/14/2024    Metabolic Disorder Labs:  Lab Results  Component Value Date   HGBA1C 5.1 06/07/2022   MPG 100 06/07/2022   No results found for: PROLACTIN Lab Results  Component Value Date   CHOL 309 (H) 06/07/2022   TRIG 62 06/07/2022   HDL 75 06/07/2022   CHOLHDL 4.1 06/07/2022   VLDL 12 06/07/2022   LDLCALC 222 (H) 06/07/2022   LDLCALC 125 (H) 11/27/2021    See  Psychiatric Specialty Exam and Suicide Risk Assessment completed by Attending Physician prior to discharge.  Discharge destination:  Home  Is patient on multiple antipsychotic therapies at discharge:  No   Has Patient had three or more failed trials of antipsychotic monotherapy by history:  No  Recommended Plan for Multiple Antipsychotic Therapies: NA  Discharge Instructions     Diet - low sodium heart healthy   Complete by: As directed    Increase activity slowly   Complete by: As directed       Allergies as of 07/27/2024       Reactions   Amoxicillin Hives   Clavulanic Acid Other (  See Comments)   Family is unaware of this         Medication List     TAKE these medications      Indication  ARIPiprazole  15 MG tablet Commonly known as: ABILIFY  Take 0.5 tablets (7.5 mg total) by mouth daily.    clonazePAM  0.5 MG disintegrating tablet Commonly known as: KLONOPIN  Take 1 tablet (0.5 mg total) by mouth at bedtime. What changed: when to take this    levothyroxine  137 MCG tablet Commonly known as: Synthroid  Take 1 tablet (137 mcg total) by mouth daily at 6 (six) AM.    sertraline  50 MG tablet Commonly known as: ZOLOFT  Take 1 tablet (50 mg total) by mouth daily. What changed:  medication strength how much to take    Travoprost (BAK Free) 0.004 % Soln ophthalmic solution Commonly known as: TRAVATAN Place 1 drop into both eyes at bedtime.         Follow-up Information     [facility] - [facility]. Go on 07/29/2024.   Why: You have an appointment scheduled with your PCP for 07/29/24 at 4:15 PM. You will discuss a referral for neurology at this appointment Contact information: [address] [place], [place] [address] [phone]                Follow-up recommendations:  Activity:  As tolerated    Signed: Metztli Sachdev, MD 07/27/2024, 10:12 AM

## 2024-07-27 NOTE — Plan of Care (Signed)
  Problem: Education: Goal: Ability to state activities that reduce stress will improve Outcome: Progressing   Problem: Coping: Goal: Ability to identify and develop effective coping behavior will improve Outcome: Progressing   Problem: Self-Concept: Goal: Ability to identify factors that promote anxiety will improve Outcome: Progressing Goal: Level of anxiety will decrease Outcome: Progressing Goal: Ability to modify response to factors that promote anxiety will improve Outcome: Progressing   Problem: Education: Goal: Utilization of techniques to improve thought processes will improve Outcome: Progressing Goal: Knowledge of the prescribed therapeutic regimen will improve Outcome: Progressing   Problem: Activity: Goal: Interest or engagement in leisure activities will improve Outcome: Progressing Goal: Imbalance in normal sleep/wake cycle will improve Outcome: Progressing   Problem: Coping: Goal: Coping ability will improve Outcome: Progressing Goal: Will verbalize feelings Outcome: Progressing   Problem: Health Behavior/Discharge Planning: Goal: Ability to make decisions will improve Outcome: Progressing Goal: Compliance with therapeutic regimen will improve Outcome: Progressing   Problem: Role Relationship: Goal: Will demonstrate positive changes in social behaviors and relationships Outcome: Progressing   Problem: Safety: Goal: Ability to disclose and discuss suicidal ideas will improve Outcome: Progressing Goal: Ability to identify and utilize support systems that promote safety will improve Outcome: Progressing   Problem: Self-Concept: Goal: Will verbalize positive feelings about self Outcome: Progressing Goal: Level of anxiety will decrease Outcome: Progressing   Problem: Education: Goal: Ability to make informed decisions regarding treatment will improve Outcome: Progressing   Problem: Coping: Goal: Coping ability will improve Outcome:  Progressing   Problem: Health Behavior/Discharge Planning: Goal: Identification of resources available to assist in meeting health care needs will improve Outcome: Progressing   Problem: Medication: Goal: Compliance with prescribed medication regimen will improve Outcome: Progressing   Problem: Self-Concept: Goal: Ability to disclose and discuss suicidal ideas will improve Outcome: Progressing Goal: Will verbalize positive feelings about self Outcome: Progressing

## 2024-07-27 NOTE — Progress Notes (Signed)
  [facility] Adult Case Management Discharge Plan :  Will you be returning to the same living situation after discharge:  Yes,  pt will return home  At discharge, do you have transportation home?: Yes,  Pt's husband will pick her up  Do you have the ability to pay for your medications: Yes,  MEDICARE / MEDICARE PART A AND B  Release of information consent forms completed and in the chart;  Patient's signature needed at discharge.  Patient to Follow up at:  Follow-up Information     [facility] - [facility]. Go on 07/29/2024.   Why: You have an appointment scheduled with your PCP for 07/29/24 at 4:15 PM. You will discuss a referral for neurology at this appointment Contact information: [address] [place], [place] [address] [phone]        [facility]. Go on 09/09/2024.   Why: Your follow-up appointment is scheduled for 09/09/24 at 1:30 PM with Dr. Tasia. Please remember to bring your insurance card. Contact information: [address] [place] [address] [phone]                 Next level of care provider has access to [facility] Link:yes  Safety Planning and Suicide Prevention discussed: Yes,  Gerard Bonus, husband , [phone]     Has patient been referred to the Quitline?: Patient does not use tobacco/nicotine products  Patient has been referred for addiction treatment: No known substance use disorder.  [address] LCSWA 07/27/2024, 11:17 AM

## 2024-07-27 NOTE — Care Management Important Message (Signed)
 Important Message  Patient Details  Name: Alyssa [name] MRN: [id number] Date of Birth: [birth year]   Important Message Given:  Yes - Medicare IM     Lum JONETTA Croft, LCSWA 07/27/2024, 11:20 AM

## 2024-07-27 NOTE — Progress Notes (Signed)

## 2024-07-27 NOTE — Progress Notes (Signed)
   07/26/24 2100  Psych Admission Type (Psych Patients Only)  Admission Status Involuntary  Psychosocial Assessment  Patient Complaints None  Eye Contact Fair  Facial Expression Blank  Affect Appropriate to circumstance  Speech Soft  Interaction Minimal  Motor Activity Slow  Appearance/Hygiene In scrubs  Behavior Characteristics Cooperative  Mood Pleasant  Thought Process  Coherency WDL  Content WDL  Delusions None reported or observed  Perception WDL  Hallucination None reported or observed  Judgment Impaired  Confusion Mild  Danger to Self  Current suicidal ideation? Denies  Danger to Others  Danger to Others None reported or observed

## 2024-09-09 ENCOUNTER — Ambulatory Visit (HOSPITAL_COMMUNITY): Admitting: Psychiatry

## 2024-09-11 ENCOUNTER — Ambulatory Visit (HOSPITAL_COMMUNITY): Admitting: Psychiatry

## 2024-10-06 ENCOUNTER — Ambulatory Visit (HOSPITAL_COMMUNITY): Admitting: Psychiatry

## 2024-10-06 ENCOUNTER — Other Ambulatory Visit: Payer: Self-pay

## 2024-10-06 ENCOUNTER — Encounter (HOSPITAL_COMMUNITY): Payer: Self-pay | Admitting: Psychiatry

## 2024-10-06 ENCOUNTER — Ambulatory Visit (HOSPITAL_BASED_OUTPATIENT_CLINIC_OR_DEPARTMENT_OTHER): Admitting: Psychiatry

## 2024-10-06 VITALS — BP 196/92 | HR 99 | Ht 68.0 in | Wt 165.0 lb

## 2024-10-06 DIAGNOSIS — F320 Major depressive disorder, single episode, mild: Secondary | ICD-10-CM

## 2024-10-06 MED ORDER — CARIPRAZINE HCL 1.5 MG PO CAPS: 1.5000 mg | ORAL_CAPSULE | Freq: Every morning | ORAL | 4 refills | Status: AC

## 2024-10-06 MED ORDER — CLONAZEPAM 0.5 MG PO TBDP: ORAL_TABLET | ORAL | 2 refills | Status: AC
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
## 2024-11-10 ENCOUNTER — Ambulatory Visit (HOSPITAL_COMMUNITY): Admitting: Psychiatry

## 2024-12-08 ENCOUNTER — Ambulatory Visit (HOSPITAL_COMMUNITY): Admitting: Psychiatry

## 2025-01-05 ENCOUNTER — Ambulatory Visit (HOSPITAL_COMMUNITY): Admitting: Psychiatry
